# Patient Record
Sex: Male | Born: 1955 | Race: White | Hispanic: No | Marital: Married | Smoking: Former smoker
Health system: Southern US, Community
[De-identification: ages and names within clinical notes are randomized; demographics above are authoritative.]

## PROBLEM LIST (undated history)

## (undated) DIAGNOSIS — K219 Gastro-esophageal reflux disease without esophagitis: Secondary | ICD-10-CM

## (undated) DIAGNOSIS — I10 Essential (primary) hypertension: Secondary | ICD-10-CM

## (undated) DIAGNOSIS — J189 Pneumonia, unspecified organism: Secondary | ICD-10-CM

## (undated) DIAGNOSIS — F1020 Alcohol dependence, uncomplicated: Secondary | ICD-10-CM

## (undated) DIAGNOSIS — E119 Type 2 diabetes mellitus without complications: Secondary | ICD-10-CM

## (undated) HISTORY — DX: Type 2 diabetes mellitus without complications: E11.9

## (undated) HISTORY — PX: NO PAST SURGERIES: SHX2092

## (undated) HISTORY — DX: Essential (primary) hypertension: I10

---
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
## 2011-07-02 LAB — HM COLONOSCOPY

## 2013-07-11 ENCOUNTER — Ambulatory Visit: Payer: Self-pay | Admitting: Physician Assistant

## 2014-05-14 DIAGNOSIS — K635 Polyp of colon: Secondary | ICD-10-CM | POA: Insufficient documentation

## 2014-05-14 DIAGNOSIS — F5221 Male erectile disorder: Secondary | ICD-10-CM | POA: Insufficient documentation

## 2014-05-14 DIAGNOSIS — I10 Essential (primary) hypertension: Secondary | ICD-10-CM | POA: Insufficient documentation

## 2014-05-14 DIAGNOSIS — N400 Enlarged prostate without lower urinary tract symptoms: Secondary | ICD-10-CM

## 2014-05-14 DIAGNOSIS — N411 Chronic prostatitis: Secondary | ICD-10-CM | POA: Insufficient documentation

## 2014-05-14 HISTORY — DX: Chronic prostatitis: N41.1

## 2014-06-06 LAB — LIPID PANEL
Cholesterol: 211 mg/dL — AB (ref 0–200)
HDL: 46 mg/dL (ref 35–70)
LDL Cholesterol: 113 mg/dL
Triglycerides: 258 mg/dL — AB (ref 40–160)

## 2014-06-06 LAB — HEMOGLOBIN A1C: Hgb A1c MFr Bld: 9 % — AB (ref 4.0–6.0)

## 2014-06-10 ENCOUNTER — Encounter: Payer: Self-pay | Admitting: Family Medicine

## 2014-07-18 ENCOUNTER — Ambulatory Visit: Payer: Self-pay | Admitting: Urology

## 2014-08-05 ENCOUNTER — Encounter: Payer: Self-pay | Admitting: Family Medicine

## 2014-08-05 ENCOUNTER — Ambulatory Visit (INDEPENDENT_AMBULATORY_CARE_PROVIDER_SITE_OTHER): Payer: 59 | Admitting: Family Medicine

## 2014-08-05 ENCOUNTER — Other Ambulatory Visit: Payer: Self-pay | Admitting: Family Medicine

## 2014-08-05 VITALS — BP 140/80 | HR 77 | Ht 68.0 in | Wt 175.0 lb

## 2014-08-05 DIAGNOSIS — F1020 Alcohol dependence, uncomplicated: Secondary | ICD-10-CM

## 2014-08-05 DIAGNOSIS — E782 Mixed hyperlipidemia: Secondary | ICD-10-CM | POA: Diagnosis not present

## 2014-08-05 DIAGNOSIS — I10 Essential (primary) hypertension: Secondary | ICD-10-CM | POA: Diagnosis not present

## 2014-08-05 DIAGNOSIS — H6982 Other specified disorders of Eustachian tube, left ear: Secondary | ICD-10-CM

## 2014-08-05 DIAGNOSIS — E118 Type 2 diabetes mellitus with unspecified complications: Secondary | ICD-10-CM | POA: Insufficient documentation

## 2014-08-05 DIAGNOSIS — E119 Type 2 diabetes mellitus without complications: Secondary | ICD-10-CM

## 2014-08-05 DIAGNOSIS — Z720 Tobacco use: Secondary | ICD-10-CM | POA: Diagnosis not present

## 2014-08-05 DIAGNOSIS — E785 Hyperlipidemia, unspecified: Secondary | ICD-10-CM

## 2014-08-05 DIAGNOSIS — F17201 Nicotine dependence, unspecified, in remission: Secondary | ICD-10-CM | POA: Insufficient documentation

## 2014-08-05 DIAGNOSIS — F1010 Alcohol abuse, uncomplicated: Secondary | ICD-10-CM | POA: Diagnosis not present

## 2014-08-05 DIAGNOSIS — F17200 Nicotine dependence, unspecified, uncomplicated: Secondary | ICD-10-CM

## 2014-08-05 DIAGNOSIS — E1169 Type 2 diabetes mellitus with other specified complication: Secondary | ICD-10-CM | POA: Insufficient documentation

## 2014-08-05 HISTORY — DX: Alcohol dependence, uncomplicated: F10.20

## 2014-08-05 NOTE — Progress Notes (Signed)
Date:  08/05/2014   Name:  Calvin Sanders   DOB:  1955/07/05   MRN:  [id number]  PCP:  No primary care provider on file.    Chief Complaint: Follow-up   History of Present Illness:  This is a 59 y.o. male for f/u newly dx'd T2DM, has not really made any dietary changes but weight down 4#, walks a lot at work, has not seen [facility]. Seen by optho less than yr ago. Still smoking daily and drinking 2-3 beers nightly with more on the weekends. Tolerating metformin and increased BP med well. L ear bothers when flies.  Review of Systems:  Review of Systems  Respiratory: Negative for shortness of breath.   Cardiovascular: Negative for chest pain and leg swelling.  Gastrointestinal: Negative for diarrhea.    Patient Active Problem List   Diagnosis Date Noted  . Type 2 diabetes mellitus 08/05/2014  . Hyperlipemia, mixed 08/05/2014  . Smoker 08/05/2014  . Alcohol abuse 08/05/2014  . Colon polyps 05/14/2014  . Chronic prostatitis 05/14/2014  . Hypertension 05/14/2014  . Erectile disorder, generalized, mild 05/14/2014  . BPH (benign prostatic hyperplasia) 05/14/2014    Prior to Admission medications   Medication Sig Start Date End Date Taking? Authorizing Provider  Ibuprofen (ADVIL MIGRAINE PO) Take 1 tablet by mouth every 4 (four) hours as needed.   Yes Historical Provider, MD  losartan-hydrochlorothiazide (HYZAAR) 100-25 MG per tablet Take 1 tablet by mouth daily. 08/03/14  Yes Historical Provider, MD  metFORMIN (GLUCOPHAGE) 500 MG tablet Take 1 tablet by mouth 2 (two) times daily. 07/31/14  Yes Historical Provider, MD  sildenafil (VIAGRA) 100 MG tablet Take 100 mg by mouth daily as needed for erectile dysfunction.   Yes Shannon A McGowan, PA-C  tadalafil (CIALIS) 20 MG tablet Take 20 mg by mouth daily as needed for erectile dysfunction.   Yes Shannon A McGowan, PA-C    No Known Allergies  No past surgical history on file.  History  Substance Use Topics  . Smoking status:  Current Every Day Smoker  . Smokeless tobacco: Not on file  . Alcohol Use: 15.0 oz/week    25 Standard drinks or equivalent per week    No family history on file.  Medication list has been reviewed and updated.  Physical Examination: BP 140/80 mmHg  Pulse 77  Ht 5\' 8"  (1.727 m)  Wt 175 lb (79.379 kg)  BMI 26.61 kg/m2  Physical Exam  Constitutional: He appears well-developed and well-nourished.  HENT:  L TM retracted R TM clear  Cardiovascular: Normal rate, regular rhythm and normal heart sounds.   Pulmonary/Chest: Effort normal and breath sounds normal.  Musculoskeletal: He exhibits no edema.    Assessment and Plan:  1. Essential hypertension Marginal control, consider CCB if blood work ok - Medical laboratory scientific officer (BMET)  2. Type 2 diabetes mellitus without complication New onset on metformin x 1 month, minimal lifestyle changes, NCS diet, exercise, and weight loss discussed, f/u [facility] as ordered - Urine Microalbumin w/creat. ratio - Lipid Profile - HgB A1c  3. Hyperlipemia, mixed Recheck lipids, may need statin  4. Smoker Advised quitting  5. Alcohol abuse Advised reduction in alcohol use  6. Eustachian tube dysfunction, left Trial Sudafed or Afrin NS before flying  Return in about 3 months (around 11/05/2014).  Satira Anis. [facility]  08/05/2014

## 2014-08-06 LAB — BASIC METABOLIC PANEL
BUN/Creatinine Ratio: 10 (ref 10–22)
BUN: 7 mg/dL — ABNORMAL LOW (ref 8–27)
CO2: 23 mmol/L (ref 18–29)
Calcium: 9.2 mg/dL (ref 8.6–10.2)
Chloride: 94 mmol/L — ABNORMAL LOW (ref 97–108)
Creatinine, Ser: 0.73 mg/dL — ABNORMAL LOW (ref 0.76–1.27)
GFR calc non Af Amer: 101 mL/min/{1.73_m2} (ref >59)
GFR, EST AFRICAN AMERICAN: 117 mL/min/{1.73_m2} (ref >59)
Glucose: 149 mg/dL — ABNORMAL HIGH (ref 65–99)
POTASSIUM: 4.3 mmol/L (ref 3.5–5.2)
SODIUM: 134 mmol/L (ref 134–144)

## 2014-08-06 LAB — LIPID PANEL
CHOL/HDL RATIO: 4.6 ratio (ref 0.0–5.0)
CHOLESTEROL TOTAL: 213 mg/dL — AB (ref 100–199)
HDL: 46 mg/dL (ref >39)
LDL Calculated: 135 mg/dL — ABNORMAL HIGH (ref 0–99)
Triglycerides: 161 mg/dL — ABNORMAL HIGH (ref 0–149)
VLDL CHOLESTEROL CAL: 32 mg/dL (ref 5–40)

## 2014-08-06 LAB — MICROALBUMIN / CREATININE URINE RATIO
Creatinine, Urine: 86.3 mg/dL
Microalbumin, Urine: <3 ug/mL

## 2014-08-06 LAB — HEMOGLOBIN A1C
ESTIMATED AVERAGE GLUCOSE: 189 mg/dL
Hgb A1c MFr Bld: 8.2 % — ABNORMAL HIGH (ref 4.8–5.6)

## 2014-08-16 ENCOUNTER — Telehealth: Payer: Self-pay

## 2014-08-16 NOTE — Telephone Encounter (Signed)
-----   Message from [place], [place] sent at 08/16/2014 10:26 AM EDT ----- [facility]

## 2014-08-16 NOTE — Telephone Encounter (Signed)
Spoke with pt. Pt. Advised of all lab results and voiced understanding. [facility]

## 2014-09-05 ENCOUNTER — Encounter: Payer: Managed Care, Other (non HMO) | Attending: Family Medicine | Admitting: *Deleted

## 2014-09-05 ENCOUNTER — Encounter: Payer: Self-pay | Admitting: *Deleted

## 2014-09-05 VITALS — BP 160/62 | Ht 68.0 in | Wt 173.5 lb

## 2014-09-05 DIAGNOSIS — E119 Type 2 diabetes mellitus without complications: Secondary | ICD-10-CM | POA: Insufficient documentation

## 2014-09-05 NOTE — Progress Notes (Signed)
Diabetes Self-Management Education  Visit Type: First/Initial  Appt. Start Time: 1330 Appt. End Time: 0263  09/05/2014  Mr. Calvin Sanders, identified by name and date of birth, is a 59 y.o. male with a diagnosis of Diabetes: Type 2.  Other people present during visit:  Spouse   ASSESSMENT Blood pressure 160/62, height 5\' 8"  (1.727 m), weight 173 lb 8 oz (78.699 kg). Body mass index is 26.39 kg/(m^2).  Initial Visit Information: Are you currently following a meal plan?: No Are you taking your medications as prescribed?: Yes (doesn't always take evening dose of Metformin) Are you checking your feet?: No How often do you need to have someone help you when you read instructions, pamphlets, or other written materials from your doctor or pharmacy?: 1 - Never What is the last grade level you completed in school?: 12  Psychosocial: Patient Belief/Attitude about Diabetes: Other (comment) (Pt is not sure if he is ready to make changes to control blood sugars. He reports having diabetes "doesn't bother me.") Self-care barriers: None Self-management support: Family Other persons present: Spouse/SO Patient Concerns: Nutrition/Meal planning, Medication, Monitoring, Healthy Lifestyle, Problem Solving, Glycemic Control Special Needs: None Preferred Learning Style: Auditory, Hands on Learning Readiness: Contemplating  Complications:  Last HgB A1C per patient/outside source: 8.2 mg/dL (08/05/14) How often do you check your blood sugar?: 0 times/day (not testing) (Provided One Touch Verio Flex meter and instructed on use. BG upon return demonstration was 139 mg/dL at 2:30 pm - 3 hrs pp. ) Have you had a dilated eye exam in the past 12 months?: Yes Have you had a dental exam in the past 12 months?: Yes  Diet Intake: Breakfast: usually skips or has gravy biscuit from fast food restaurant Lunch: eats out - fast food Dinner: eats out - likes steak potato and bread Snack (evening): eats sweets at  night Beverage(s): occasional sweet tea; diet soda; drinks little water  Exercise: Exercise: ADL's  Individualized Plan for Diabetes Self-Management Training:  Learning Objective:  Patient will have a greater understanding of diabetes self-management.  Education Topics Reviewed with Patient Today: Definition of diabetes, type 1 and 2, and the diagnosis of diabetes, Factors that contribute to the development of diabetes Role of diet in the treatment of diabetes and the relationship between the three main macronutrients and blood glucose level Role of exercise on diabetes management, blood pressure control and cardiac health. Reviewed patients medication for diabetes, action, purpose, timing of dose and side effects. Taught/evaluated SMBG meter., Purpose and frequency of SMBG., Identified appropriate SMBG and/or A1C goals. Relationship between chronic complications and blood glucose control Review risk of smoking and offered smoking cessation  PATIENTS GOALS/Plan (Developed by the patient): Improve blood sugars Decrease medications Prevent diabetes complications Lead a healthier lifestyle Quit smoking  Plan:   Patient Instructions  Check blood sugars 2 x day before breakfast and 2 hrs after supper 3-4 x week Exercise:  Begin walking for   15 minutes   3 days a week  and gradually increase to 150 minutes/week Avoid sugar sweetened drinks (tea)  Eat 3 meals day, 1-2  snacks a day Space meals 4-6 hours apart Don't skip meals Limit desserts/sweets Call your doctor for a prescription for:  1. Meter strips (type)  One Touch Verio checking  3-4 times per week  2. Lancets (type)  One Touch Delica checking  3-4   times per week Quit smoking  Call if you want to return for classes or 1:1 appt with nurse or dietitian  Expected Outcomes:  Demonstrated limited interest in learning.  Expect minimal changes  Education material provided:  General Meal Planning Guidelines Meter - One Touch  Verio Flex Fast Food Guide (BD)  If problems or questions, patient to contact team via:  Johny Drilling, [place], [place], CDE [phone]  Future DSME appointment:  (Pt doesn't want to return due to his work schedule. )

## 2014-09-05 NOTE — Patient Instructions (Addendum)
Check blood sugars 2 x day before breakfast and 2 hrs after supper 3-4 x week Exercise:  Begin walking for   15 minutes   3 days a week  and gradually increase to 150 minutes/week Avoid sugar sweetened drinks (tea)  Eat 3 meals day, 1-2  snacks a day Space meals 4-6 hours apart Don't skip meals Limit desserts/sweets Call your doctor for a prescription for:  1. Meter strips (type)  One Touch Verio checking  3-4 times per week  2. Lancets (type)  One Touch Delica checking  3-4   times per week Quit smoking  Call if you want to return for classes or 1:1 appt with nurse or dietitian

## 2014-09-06 ENCOUNTER — Ambulatory Visit (INDEPENDENT_AMBULATORY_CARE_PROVIDER_SITE_OTHER): Payer: 59 | Admitting: Urology

## 2014-09-06 ENCOUNTER — Encounter: Payer: Self-pay | Admitting: Urology

## 2014-09-06 VITALS — BP 151/74 | HR 74 | Ht 68.0 in | Wt 171.5 lb

## 2014-09-06 DIAGNOSIS — N401 Enlarged prostate with lower urinary tract symptoms: Secondary | ICD-10-CM

## 2014-09-06 DIAGNOSIS — Z8042 Family history of malignant neoplasm of prostate: Secondary | ICD-10-CM

## 2014-09-06 DIAGNOSIS — N138 Other obstructive and reflux uropathy: Secondary | ICD-10-CM

## 2014-09-06 DIAGNOSIS — N528 Other male erectile dysfunction: Secondary | ICD-10-CM | POA: Diagnosis not present

## 2014-09-06 DIAGNOSIS — N529 Male erectile dysfunction, unspecified: Secondary | ICD-10-CM | POA: Insufficient documentation

## 2014-09-06 LAB — BLADDER SCAN AMB NON-IMAGING: SCAN RESULT: 25

## 2014-09-06 MED ORDER — TADALAFIL 20 MG PO TABS: 20.0000 mg | ORAL_TABLET | Freq: Every day | ORAL | Status: DC | PRN

## 2014-09-06 NOTE — Progress Notes (Signed)
09/06/2014 10:37 AM   Calvin Sanders [birth year] [id number]  Referring provider: No referring provider defined for this encounter.  Chief Complaint  Patient presents with  . Benign Prostatic Hypertrophy    HPI: Calvin Sanders is a 59 year old white male with a history of ED and BPH with LUTS who presents today for his yearly follow up.    His IPSS score today is 12, which is moderate lower urinary tract symptomatology. He is pleased with his quality life due to his urinary symptoms. His PVR is 25 mL.   His major complaint today frequency and urgency.  He has had these symptoms for two years.  He denies any dysuria, hematuria or suprapubic pain.   He currently managing his BPH with LUTS with observation and the avoidence of bladder irritants.  His has not had any prostate surgeries or biopsies.  Previous PSA's:    1.5 ng/mL on 07/16/2013     He also denies any recent fevers, chills, nausea or vomiting.  He has a family history of PCa, with his brother being diagnosed with PCa.         IPSS      09/06/14 1000       International Prostate Symptom Score   How often have you had the sensation of not emptying your bladder? Less than half the time     How often have you had to urinate less than every two hours? More than half the time     How often have you found you stopped and started again several times when you urinated? Less than half the time     How often have you found it difficult to postpone urination? About half the time     How often have you had a weak urinary stream? Not at All     How often have you had to strain to start urination? Not at All     How many times did you typically get up at night to urinate? 1 Time     Total IPSS Score 12     Quality of Life due to urinary symptoms   If you were to spend the rest of your life with your urinary condition just the way it is now how would you feel about that? Pleased        Score:  1-7 Mild 8-19  Moderate 20-35 Severe  His SHIM score is 13, which is mild to moderate ED.   He has been having difficulty with erections for several years.   His major complaint is maintaining.  His libido is good.   His risk factors for ED are smoking, DM, BPH, alcohol abuse and HTN.  He denies any painful erections or curvatures with his erections.   He has tried Cialis and Viagra in the past.  Cialis is the most effective.        SHIM      09/06/14 1018       SHIM: Over the last 6 months:   How do you rate your confidence that you could get and keep an erection? Low     When you had erections with sexual stimulation, how often were your erections hard enough for penetration (entering your partner)? Most Times (much more than half the time)     During sexual intercourse, how often were you able to maintain your erection after you had penetrated (entered) your partner? Not Difficult     During sexual intercourse, how difficult was  it to maintain your erection to completion of intercourse? Extremely Difficult     When you attempted sexual intercourse, how often was it satisfactory for you? Extremely Difficult     SHIM Total Score   SHIM 13        Score: 1-7 Severe ED 8-11 Moderate ED 12-16 Mild-Moderate ED 17-21 Mild ED 22-25 No ED        PMH: Past Medical History  Diagnosis Date  . Hypertension   . Diabetes mellitus without complication     Surgical History: No past surgical history on file.  Home Medications:    Medication List       This list is accurate as of: 09/06/14 10:37 AM.  Always use your most recent med list.               ADVIL MIGRAINE PO  Take 1 tablet by mouth every 4 (four) hours as needed.     losartan-hydrochlorothiazide 100-25 MG per tablet  Commonly known as:  HYZAAR  Take 1 tablet by mouth daily.     metFORMIN 500 MG tablet  Commonly known as:  GLUCOPHAGE  Take 1 tablet by mouth 2 (two) times daily.     sildenafil 100 MG tablet  Commonly  known as:  VIAGRA  Take 100 mg by mouth daily as needed for erectile dysfunction.     tadalafil 20 MG tablet  Commonly known as:  CIALIS  Take 20 mg by mouth daily as needed for erectile dysfunction.        Allergies: No Known Allergies  Family History: Family History  Problem Relation Age of Onset  . Diabetes Mother   . Cancer Father     bone  . Kidney cancer Neg Hx   . Kidney disease Neg Hx   . Heart disease Brother   . Prostate cancer Brother     Social History:  reports that he has been smoking Cigarettes.  He started smoking about 10 years ago. He has a 10 pack-year smoking history. He has never used smokeless tobacco. He reports that he drinks about 15.0 oz of alcohol per week. His drug history is not on file.  ROS: UROLOGY Frequent Urination?: Yes Hard to postpone urination?: Yes Burning/pain with urination?: No Get up at night to urinate?: Yes Leakage of urine?: Yes Urine stream starts and stops?: Yes Trouble starting stream?: No Do you have to strain to urinate?: No Blood in urine?: No Urinary tract infection?: No Sexually transmitted disease?: No Injury to kidneys or bladder?: No Painful intercourse?: No Weak stream?: No Erection problems?: Yes Penile pain?: No  Gastrointestinal Nausea?: No Vomiting?: No Indigestion/heartburn?: Yes Diarrhea?: Yes Constipation?: No  Constitutional Fever: No Night sweats?: Yes Weight loss?: No Fatigue?: No  Skin Skin rash/lesions?: No Itching?: No  Eyes Blurred vision?: Yes Double vision?: No  Ears/Nose/Throat Sore throat?: No Sinus problems?: No  Hematologic/Lymphatic Swollen glands?: No Easy bruising?: No  Cardiovascular Leg swelling?: No Chest pain?: No  Respiratory Cough?: No Shortness of breath?: No  Endocrine Excessive thirst?: Yes  Musculoskeletal Back pain?: Yes Joint pain?: Yes  Neurological Headaches?: Yes Dizziness?: No  Psychologic Depression?: No Anxiety?:  No  Physical Exam: BP 151/74 mmHg  Pulse 74  Ht 5\' 8"  (1.727 m)  Wt 171 lb 8 oz (77.792 kg)  BMI 26.08 kg/m2  GU: Patient with circumcised phallus.  Urethral meatus is patent.  No penile discharge. No penile lesions or rashes. Scrotum without lesions, cysts, rashes and/or edema.  Testicles  are located scrotally bilaterally. No masses are appreciated in the testicles. Left and right epididymis are normal. Rectal: Patient with  normal sphincter tone. Perineum without scarring or rashes. No rectal masses are appreciated. Prostate is approximately 50 grams, no nodules are appreciated. Seminal vesicles are normal.   Laboratory Data: Results for orders placed or performed in visit on 09/06/14  BLADDER SCAN AMB NON-IMAGING  Result Value Ref Range   Scan Result 25    No results found for: WBC, HGB, HCT, MCV, PLT  Lab Results  Component Value Date   CREATININE 0.73* 08/05/2014    No results found for: PSA  No results found for: TESTOSTERONE  Lab Results  Component Value Date   HGBA1C 8.2* 08/05/2014    Urinalysis No results found for: COLORURINE, APPEARANCEUR, LABSPEC, PHURINE, GLUCOSEU, HGBUR, BILIRUBINUR, KETONESUR, PROTEINUR, UROBILINOGEN, NITRITE, LEUKOCYTESUR  Pertinent Imaging:  Assessment & Plan:    1. BPH with LUTS:   Patient's IPSS score is 12/1.  His PVR 25.  His DRE demonstrates enlargement.  I discussed the treatment options for BPH, such as: observation over time with prn avoidance of alcohol/caffeine or medical treatment.  Patient like to continue observation.    He will follow up in 12 months for a PSA, DRE, PVR and an IPSS.    - BLADDER SCAN AMB NON-IMAGING - PSA  2. Erectile dysfunction:   Patient's SHIM is 13.  He would like to continue the Cialis.  A tree scription for Cialis 20 mg on-demand dosing is sent to his pharmacy. He will follow-up in one year's time with SH IM score and symptom recheck.  3. Family history of PCa:   Patient's brother with a h/o PCa.      No Follow-up on file.  Zara Council, [facility] [address], [place], [place] [address] [phone]

## 2014-09-07 ENCOUNTER — Telehealth: Payer: Self-pay

## 2014-09-07 DIAGNOSIS — R972 Elevated prostate specific antigen [PSA]: Secondary | ICD-10-CM

## 2014-09-07 LAB — PSA: Prostate Specific Ag, Serum: 1.7 ng/mL (ref 0.0–4.0)

## 2014-09-07 NOTE — Telephone Encounter (Signed)
Spoke with pt and made aware of lab results. Pt voiced understanding. Orders were placed.

## 2014-09-07 NOTE — Telephone Encounter (Signed)
-----   Message from Nori Riis, PA-C sent at 09/07/2014  9:03 AM EDT ----- Patient's PSA is stable.  We will see him in 12 months.  PSA to be drawn before his next appointment.

## 2014-09-22 ENCOUNTER — Telehealth: Payer: Self-pay

## 2014-09-22 MED ORDER — ONETOUCH LANCETS MISC: 1.0000 [IU] | Status: DC

## 2014-09-22 MED ORDER — GLUCOSE BLOOD VI STRP: ORAL_STRIP | Status: DC

## 2014-09-22 NOTE — Telephone Encounter (Signed)
Patient needs lancets and strips for his One Touch Verio Flex to [employer] [facility]

## 2014-09-22 NOTE — Telephone Encounter (Signed)
Done

## 2014-11-10 ENCOUNTER — Telehealth: Payer: Self-pay

## 2014-11-10 MED ORDER — GLUCOSE BLOOD VI STRP: ORAL_STRIP | Status: DC

## 2014-11-10 NOTE — Telephone Encounter (Signed)
Done

## 2014-11-10 NOTE — Addendum Note (Signed)
Addended by: Adline Potter on: 11/10/2014 12:44 PM   Modules accepted: Orders

## 2014-11-10 NOTE — Telephone Encounter (Signed)
Refill test strips per pt request.

## 2015-01-31 ENCOUNTER — Other Ambulatory Visit: Payer: Self-pay | Admitting: Family Medicine

## 2015-01-31 NOTE — Telephone Encounter (Signed)
Patient has not been seen here by Dr. Wynetta Emery in over a year It looks like he is now seeing Dr. Vicente Masson I will deny this Rx request (covering for Dr. Wynetta Emery)

## 2015-01-31 NOTE — Telephone Encounter (Signed)
Needs RTO for f/u DM, HTN, should have been seen in September

## 2015-02-07 ENCOUNTER — Ambulatory Visit (INDEPENDENT_AMBULATORY_CARE_PROVIDER_SITE_OTHER): Payer: Managed Care, Other (non HMO) | Admitting: Family Medicine

## 2015-02-07 ENCOUNTER — Encounter: Payer: Self-pay | Admitting: Family Medicine

## 2015-02-07 VITALS — BP 122/62 | HR 72 | Ht 68.0 in | Wt 175.4 lb

## 2015-02-07 DIAGNOSIS — M79672 Pain in left foot: Secondary | ICD-10-CM | POA: Diagnosis not present

## 2015-02-07 DIAGNOSIS — E663 Overweight: Secondary | ICD-10-CM

## 2015-02-07 DIAGNOSIS — Z720 Tobacco use: Secondary | ICD-10-CM | POA: Diagnosis not present

## 2015-02-07 DIAGNOSIS — F1010 Alcohol abuse, uncomplicated: Secondary | ICD-10-CM

## 2015-02-07 DIAGNOSIS — M79671 Pain in right foot: Secondary | ICD-10-CM

## 2015-02-07 DIAGNOSIS — H9313 Tinnitus, bilateral: Secondary | ICD-10-CM | POA: Diagnosis not present

## 2015-02-07 DIAGNOSIS — E782 Mixed hyperlipidemia: Secondary | ICD-10-CM

## 2015-02-07 DIAGNOSIS — E559 Vitamin D deficiency, unspecified: Secondary | ICD-10-CM

## 2015-02-07 DIAGNOSIS — I10 Essential (primary) hypertension: Secondary | ICD-10-CM

## 2015-02-07 DIAGNOSIS — F17200 Nicotine dependence, unspecified, uncomplicated: Secondary | ICD-10-CM

## 2015-02-07 DIAGNOSIS — E119 Type 2 diabetes mellitus without complications: Secondary | ICD-10-CM | POA: Diagnosis not present

## 2015-02-07 NOTE — Progress Notes (Signed)
Date:  02/07/2015   Name:  Calvin Sanders   DOB:  25-Jun-1955   MRN:  [id number]  PCP:  Adline Potter, MD    Chief Complaint: Hypertension and Diabetes   History of Present Illness:  This is a 59 y.o. male for f/u T2DM, HLD, HTN, smoking, excess alcohol use. BG's well controlled at home, higher in am as enjoys sweet snack at bedtime, last a1c 8.2% and MCR normal in June. Still drinking 4-5 beers nightly (LFT's normal 4/16) and smoking but trying to decrease both. Weight stable. Sees [facility] for optho but not in past year. Saw urology in July for BPH/ED/FH prostate ca, remains on Cialis with good effect. C/o intermittent tinnitus B ears, no progressing, just bothersome. C/o B foot pain, balls of feet, worse in am, improves during the day, some burning but no numbness/tingling.   Review of Systems:  Review of Systems  Constitutional: Negative for fever and fatigue.  Respiratory: Negative for shortness of breath.   Cardiovascular: Negative for chest pain and leg swelling.  Endocrine: Negative for polyuria.  Neurological: Negative for syncope and light-headedness.    Patient Active Problem List   Diagnosis Date Noted  . Overweight (BMI 25.0-29.9) 02/07/2015  . 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 Family history of prostate cancer 09/06/2014  . Diabetes mellitus type 2, controlled, without complications ([place]) [id number]  . Hyperlipemia, mixed 08/05/2014  . Smoker 08/05/2014  . Excessive drinking of alcohol 08/05/2014  . Colon polyps 05/14/2014  . Chronic prostatitis 05/14/2014  . Hypertension 05/14/2014  . BPH (benign prostatic hyperplasia) 05/14/2014    Prior to Admission medications   Medication Sig Start Date End Date Taking? Authorizing Provider  glucose blood test strip One Touch Zero Flex glucose test strips, use as directed 11/10/14   Adline Potter, MD  Ibuprofen (ADVIL MIGRAINE PO) Take 1 tablet by mouth every 4 (four) hours as needed.    Historical  Provider, MD  losartan-hydrochlorothiazide Konrad Penta) 50-12.5 MG tablet  01/31/15   Historical Provider, MD  metFORMIN (GLUCOPHAGE) 500 MG tablet Take 1 tablet by mouth 2 (two) times daily. 07/31/14   Historical Provider, MD  ONE TOUCH LANCETS MISC 1 Units by Does not apply route as directed. 09/22/14   Adline Potter, MD  tadalafil (CIALIS) 20 MG tablet Take 20 mg by mouth daily as needed for erectile dysfunction.    Shannon A McGowan, PA-C    No Known Allergies  No past surgical history on file.  Social History  Substance Use Topics  . Smoking status: Current Every Day Smoker -- 1.00 packs/day for 10 years    Types: Cigarettes    Start date: 02/12/2004  . Smokeless tobacco: Never Used  . Alcohol Use: 15.0 oz/week    25 Cans of beer per week     Comment: 4-6 beers every evening    Family History  Problem Relation Age of Onset  . Diabetes Mother   . Cancer Father     bone  . Kidney cancer Neg Hx   . Kidney disease Neg Hx   . Heart disease Brother   . Prostate cancer Brother     Medication list has been reviewed and updated.  Physical Examination: BP 122/62 mmHg  Pulse 72  Ht 5\' 8"  (1.727 m)  Wt 175 lb 6.4 oz (79.561 kg)  BMI 26.68 kg/m2  Physical Exam  Constitutional: He appears well-developed and well-nourished.  HENT:  TM's clear  Cardiovascular: Normal rate, regular rhythm, normal heart sounds and  intact distal pulses.   Pulmonary/Chest: Effort normal and breath sounds normal.  Musculoskeletal: He exhibits no edema.  B feet no tenderness, monofilament testing negative  Neurological: He is alert.  Skin: Skin is warm and dry.  Psychiatric: He has a normal mood and affect. His behavior is normal.  Nursing note and vitals reviewed.   Assessment and Plan:  1. Controlled type 2 diabetes mellitus without complication, without long-term current use of insulin (HCC) Improved control on metformin last visit, a1c today, pt to schedule optho appt - HgB A1c  2.  Essential hypertension Well controlled on lower dose losartan/HCTZ  3. Hyperlipemia, mixed Consider high intensity statin/asa given calculated 21yr CV risk 30%  4. Smoker Strongly advised cessation  5. Excessive drinking of alcohol Advised decrease to 2 drinks/day  6. Foot pain, bilateral Unclear etiology, progressive, MF testing negative - Ambulatory referral to Podiatry  7. Tinnitus of both ears Reassured likely benign, consider ENT referral if sxs worsen  8. Overweight (BMI 25.0-29.9) Exercise/weight loss discussed, saw diabetes educator in July - Vitamin D (25 hydroxy)  Return in about 3 months (around 05/08/2015).  Satira Anis. [facility]  02/07/2015

## 2015-02-08 DIAGNOSIS — E559 Vitamin D deficiency, unspecified: Secondary | ICD-10-CM | POA: Insufficient documentation

## 2015-02-08 LAB — HEMOGLOBIN A1C
Est. average glucose Bld gHb Est-mCnc: 169 mg/dL
Hgb A1c MFr Bld: 7.5 % — ABNORMAL HIGH (ref 4.8–5.6)

## 2015-02-08 LAB — VITAMIN D 25 HYDROXY (VIT D DEFICIENCY, FRACTURES): Vit D, 25-Hydroxy: 16 ng/mL — ABNORMAL LOW (ref 30.0–100.0)

## 2015-02-08 MED ORDER — ASPIRIN 81 MG PO TABS: 81.0000 mg | ORAL_TABLET | Freq: Every day | ORAL | Status: DC

## 2015-02-08 MED ORDER — ATORVASTATIN CALCIUM 40 MG PO TABS: 40.0000 mg | ORAL_TABLET | Freq: Every day | ORAL | Status: DC

## 2015-02-08 MED ORDER — VITAMIN D 50 MCG (2000 UT) PO CAPS: 1.0000 | ORAL_CAPSULE | Freq: Every day | ORAL | Status: DC

## 2015-02-08 NOTE — Addendum Note (Signed)
Addended by: Adline Potter on: 02/08/2015 09:25 AM   Modules accepted: Orders, SmartSet

## 2015-02-09 ENCOUNTER — Other Ambulatory Visit: Payer: Self-pay

## 2015-02-09 ENCOUNTER — Other Ambulatory Visit: Payer: Self-pay | Admitting: Family Medicine

## 2015-02-09 MED ORDER — METFORMIN HCL 500 MG PO TABS: 500.0000 mg | ORAL_TABLET | Freq: Two times a day (BID) | ORAL | Status: DC

## 2015-05-08 ENCOUNTER — Encounter: Payer: Self-pay | Admitting: Family Medicine

## 2015-05-08 ENCOUNTER — Ambulatory Visit (INDEPENDENT_AMBULATORY_CARE_PROVIDER_SITE_OTHER): Payer: Managed Care, Other (non HMO) | Admitting: Family Medicine

## 2015-05-08 VITALS — BP 122/64 | HR 64 | Ht 68.0 in | Wt 171.6 lb

## 2015-05-08 DIAGNOSIS — F1010 Alcohol abuse, uncomplicated: Secondary | ICD-10-CM

## 2015-05-08 DIAGNOSIS — R200 Anesthesia of skin: Secondary | ICD-10-CM

## 2015-05-08 DIAGNOSIS — Z720 Tobacco use: Secondary | ICD-10-CM

## 2015-05-08 DIAGNOSIS — R208 Other disturbances of skin sensation: Secondary | ICD-10-CM | POA: Diagnosis not present

## 2015-05-08 DIAGNOSIS — I10 Essential (primary) hypertension: Secondary | ICD-10-CM | POA: Diagnosis not present

## 2015-05-08 DIAGNOSIS — E663 Overweight: Secondary | ICD-10-CM | POA: Diagnosis not present

## 2015-05-08 DIAGNOSIS — E559 Vitamin D deficiency, unspecified: Secondary | ICD-10-CM

## 2015-05-08 DIAGNOSIS — N400 Enlarged prostate without lower urinary tract symptoms: Secondary | ICD-10-CM | POA: Diagnosis not present

## 2015-05-08 DIAGNOSIS — E782 Mixed hyperlipidemia: Secondary | ICD-10-CM | POA: Diagnosis not present

## 2015-05-08 DIAGNOSIS — F17200 Nicotine dependence, unspecified, uncomplicated: Secondary | ICD-10-CM

## 2015-05-08 DIAGNOSIS — E119 Type 2 diabetes mellitus without complications: Secondary | ICD-10-CM | POA: Diagnosis not present

## 2015-05-08 NOTE — Progress Notes (Signed)
Date:  05/08/2015   Name:  Calvin Sanders   DOB:  Jan 13, 1956   MRN:  [id number]  PCP:  Adline Potter, MD    Chief Complaint: Follow-up and Diabetes   History of Present Illness:  This is a 60 y.o. male for 3 month f/u, DM about the same, only check home BG's prn. C/o some numbness in fingers R > L, more constant recently, never saw podiatry for foot pain which has improved somewhat. Has also not seen optho as discussed last visit. Started on Lipitor and asa after last visit given elevated CVR. Still smoking 1 ppd and drinking 4-5 beers nightly. Taking vit D supp. BPH sxs well controlled on Cialis.  Review of Systems:  Review of Systems  Constitutional: Negative for fever and fatigue.  Respiratory: Negative for cough and shortness of breath.   Cardiovascular: Negative for chest pain and leg swelling.  Endocrine: Negative for polyuria.  Genitourinary: Negative for difficulty urinating.  Neurological: Negative for syncope and light-headedness.    Patient Active Problem List   Diagnosis Date Noted  . Vitamin D deficiency 02/08/2015  . Overweight (BMI 25.0-29.9) 02/07/2015  . 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 Family history of prostate cancer 09/06/2014  . Diabetes mellitus type 2, controlled, without complications ([place]) [id number]  . Hyperlipemia, mixed 08/05/2014  . Smoker 08/05/2014  . Excessive drinking of alcohol 08/05/2014  . Colon polyps 05/14/2014  . Chronic prostatitis 05/14/2014  . Hypertension 05/14/2014  . BPH (benign prostatic hyperplasia) 05/14/2014    Prior to Admission medications   Medication Sig Start Date End Date Taking? Authorizing Provider  aspirin 81 MG tablet Take 1 tablet (81 mg total) by mouth daily. 02/08/15  Yes Adline Potter, MD  atorvastatin (LIPITOR) 40 MG tablet Take 1 tablet (40 mg total) by mouth daily. 02/08/15  Yes Adline Potter, MD  Cholecalciferol (VITAMIN D) 2000 units CAPS Take 1 capsule (2,000 Units total) by mouth  daily. 02/08/15  Yes Adline Potter, MD  glucose blood test strip One Touch Zero Flex glucose test strips, use as directed 11/10/14  Yes Adline Potter, MD  Ibuprofen (ADVIL MIGRAINE PO) Take 1 tablet by mouth every 4 (four) hours as needed.   Yes Historical Provider, MD  losartan-hydrochlorothiazide Konrad Penta) 50-12.5 MG tablet  01/31/15  Yes Historical Provider, MD  metFORMIN (GLUCOPHAGE) 500 MG tablet Take 1 tablet (500 mg total) by mouth 2 (two) times daily. 02/09/15  Yes Adline Potter, MD  ONE TOUCH LANCETS MISC 1 Units by Does not apply route as directed. 09/22/14  Yes Adline Potter, MD  tadalafil (CIALIS) 20 MG tablet Take 20 mg by mouth daily as needed for erectile dysfunction.   Yes Nori Riis, PA-C    No Known Allergies  Past Surgical History  Procedure Laterality Date  . No past surgeries      Social History  Substance Use Topics  . Smoking status: Current Every Day Smoker -- 1.00 packs/day for 10 years    Types: Cigarettes    Start date: 02/12/2004  . Smokeless tobacco: Never Used  . Alcohol Use: 15.0 oz/week    25 Cans of beer per week     Comment: 4-6 beers every evening    Family History  Problem Relation Age of Onset  . Diabetes Mother   . Cancer Father     bone  . Kidney cancer Neg Hx   . Kidney disease Neg Hx   . Heart disease Brother   . Prostate cancer Brother  Medication list has been reviewed and updated.  Physical Examination: BP 122/64 mmHg  Pulse 64  Ht 5\' 8"  (1.727 m)  Wt 171 lb 9.6 oz (77.837 kg)  BMI 26.10 kg/m2  Physical Exam  Constitutional: He appears well-developed and well-nourished.  Cardiovascular: Normal rate, regular rhythm and normal heart sounds.   Pulmonary/Chest: Effort normal and breath sounds normal.  Musculoskeletal: He exhibits no edema.  Neurological: He is alert.  Skin: Skin is warm and dry.  Psychiatric: He has a normal mood and affect. His behavior is normal.  Nursing note and vitals reviewed.   Assessment  and Plan:  1. Controlled type 2 diabetes mellitus without complication, without long-term current use of insulin (HCC) Improved controlled (a1c 7.5%) last visit, cont metformin, see optho Burt Ek) before next visit - HgB A1c  2. Essential hypertension Well controlled on Hyzaar - Comprehensive Metabolic Panel (CMET) - CBC  3. 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 Hyperlipemia, mixed On Lipitor/asa - Lipid Profile  5. BPH (benign prostatic hyperplasia) Followed by urology, cont Cialis  6. Smoker Advised cessation or at least cutting back use  7. Excessive drinking of alcohol Advised limiting use to 2 beers/night  8. Overweight (BMI 25.0-29.9) Weight down 4#, cont weight loss  9. Vitamin D deficiency On supplement - Vitamin D (25 hydroxy)   Return in about 3 months (around 08/08/2015).  Satira Anis. [facility]  05/08/2015

## 2015-05-09 ENCOUNTER — Other Ambulatory Visit: Payer: Self-pay

## 2015-05-09 LAB — CBC
HEMATOCRIT: 43.2 % (ref 37.5–51.0)
HEMOGLOBIN: 15.1 g/dL (ref 12.6–17.7)
MCH: 34.6 pg — AB (ref 26.6–33.0)
MCHC: 35 g/dL (ref 31.5–35.7)
MCV: 99 fL — AB (ref 79–97)
Platelets: 203 10*3/uL (ref 150–379)
RBC: 4.36 x10E6/uL (ref 4.14–5.80)
RDW: 13.5 % (ref 12.3–15.4)
WBC: 6.8 10*3/uL (ref 3.4–10.8)

## 2015-05-09 LAB — COMPREHENSIVE METABOLIC PANEL
A/G RATIO: 2.2 (ref 1.2–2.2)
ALBUMIN: 4.6 g/dL (ref 3.6–4.8)
ALT: 30 IU/L (ref 0–44)
AST: 22 IU/L (ref 0–40)
Alkaline Phosphatase: 69 IU/L (ref 39–117)
BUN/Creatinine Ratio: 10 (ref 10–22)
BUN: 8 mg/dL (ref 8–27)
Bilirubin Total: 0.6 mg/dL (ref 0.0–1.2)
CALCIUM: 9.3 mg/dL (ref 8.6–10.2)
CO2: 22 mmol/L (ref 18–29)
CREATININE: 0.78 mg/dL (ref 0.76–1.27)
Chloride: 94 mmol/L — ABNORMAL LOW (ref 96–106)
GFR, EST AFRICAN AMERICAN: 113 mL/min/{1.73_m2} (ref >59)
GFR, EST NON AFRICAN AMERICAN: 97 mL/min/{1.73_m2} (ref >59)
GLOBULIN, TOTAL: 2.1 g/dL (ref 1.5–4.5)
Glucose: 125 mg/dL — ABNORMAL HIGH (ref 65–99)
POTASSIUM: 4.7 mmol/L (ref 3.5–5.2)
SODIUM: 137 mmol/L (ref 134–144)
TOTAL PROTEIN: 6.7 g/dL (ref 6.0–8.5)

## 2015-05-09 LAB — LIPID PANEL
CHOLESTEROL TOTAL: 160 mg/dL (ref 100–199)
Chol/HDL Ratio: 2.5 ratio units (ref 0.0–5.0)
HDL: 65 mg/dL (ref >39)
LDL CALC: 80 mg/dL (ref 0–99)
TRIGLYCERIDES: 73 mg/dL (ref 0–149)
VLDL Cholesterol Cal: 15 mg/dL (ref 5–40)

## 2015-05-09 LAB — VITAMIN D 25 HYDROXY (VIT D DEFICIENCY, FRACTURES): VIT D 25 HYDROXY: 36.4 ng/mL (ref 30.0–100.0)

## 2015-05-09 LAB — HEMOGLOBIN A1C
ESTIMATED AVERAGE GLUCOSE: 163 mg/dL
HEMOGLOBIN A1C: 7.3 % — AB (ref 4.8–5.6)

## 2015-06-08 ENCOUNTER — Telehealth: Payer: Self-pay

## 2015-06-08 MED ORDER — LOSARTAN POTASSIUM-HCTZ 50-12.5 MG PO TABS: 1.0000 | ORAL_TABLET | Freq: Every day | ORAL | Status: DC

## 2015-06-08 NOTE — Telephone Encounter (Signed)
Sent to [place] 

## 2015-06-08 NOTE — Addendum Note (Signed)
Addended by: Adline Potter on: 06/08/2015 11:53 AM   Modules accepted: Orders

## 2015-06-08 NOTE — Telephone Encounter (Signed)
Done

## 2015-08-07 ENCOUNTER — Ambulatory Visit: Payer: Managed Care, Other (non HMO) | Admitting: Family Medicine

## 2015-08-25 ENCOUNTER — Other Ambulatory Visit: Payer: 59

## 2015-09-04 ENCOUNTER — Ambulatory Visit: Payer: 59 | Admitting: Urology

## 2015-09-20 ENCOUNTER — Other Ambulatory Visit: Payer: Self-pay

## 2015-09-20 ENCOUNTER — Ambulatory Visit (INDEPENDENT_AMBULATORY_CARE_PROVIDER_SITE_OTHER): Payer: Managed Care, Other (non HMO) | Admitting: Internal Medicine

## 2015-09-20 ENCOUNTER — Encounter: Payer: Self-pay | Admitting: Internal Medicine

## 2015-09-20 VITALS — BP 124/72 | HR 69 | Temp 98.6°F | Resp 16 | Ht 68.0 in | Wt 160.4 lb

## 2015-09-20 DIAGNOSIS — I10 Essential (primary) hypertension: Secondary | ICD-10-CM | POA: Diagnosis not present

## 2015-09-20 DIAGNOSIS — R634 Abnormal weight loss: Secondary | ICD-10-CM | POA: Diagnosis not present

## 2015-09-20 DIAGNOSIS — N400 Enlarged prostate without lower urinary tract symptoms: Secondary | ICD-10-CM

## 2015-09-20 DIAGNOSIS — K2970 Gastritis, unspecified, without bleeding: Secondary | ICD-10-CM

## 2015-09-20 DIAGNOSIS — E119 Type 2 diabetes mellitus without complications: Secondary | ICD-10-CM

## 2015-09-20 MED ORDER — DEXLANSOPRAZOLE 60 MG PO CPDR
60.0000 mg | DELAYED_RELEASE_CAPSULE | Freq: Every day | ORAL | 0 refills | Status: DC
Start: 2015-09-20 — End: 2015-09-20

## 2015-09-20 MED ORDER — OMEPRAZOLE 40 MG PO CPDR: 40.0000 mg | DELAYED_RELEASE_CAPSULE | Freq: Every day | ORAL | 1 refills | Status: DC

## 2015-09-20 MED ORDER — TADALAFIL 20 MG PO TABS: 20.0000 mg | ORAL_TABLET | Freq: Every day | ORAL | 1 refills | Status: DC | PRN

## 2015-09-20 MED ORDER — DEXLANSOPRAZOLE 60 MG PO CPDR: 60.0000 mg | DELAYED_RELEASE_CAPSULE | Freq: Every day | ORAL | 0 refills | Status: DC

## 2015-09-20 NOTE — Patient Instructions (Signed)

## 2015-09-20 NOTE — Progress Notes (Signed)
Date:  09/20/2015   Name:  Calvin Sanders   DOB:  1955-08-24   MRN:  [id number]   Chief Complaint: Nausea (Comes and goes but worse in stressful situations. Patient having lot of stress at work. ) Patient reports being under increased amounts of stress at work. 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 He admits to eating poorly - today he's had peanut M&M's, peanut butter crackers and some peanuts. Review of weights show a 20 pound weight loss in the past 18 months. He denies chest pains shortness of breath wheezing change in bowel habits vomiting or headaches. He continues to smoke cigarettes daily and drink 4-6 beers per day.   Abdominal Cramping  This is a new problem. The problem occurs intermittently. The problem has been waxing and waning. The pain is located in the epigastric region. The quality of the pain is colicky. The abdominal pain radiates to the epigastric region. Associated symptoms include anorexia, nausea and weight loss. Pertinent negatives include no arthralgias, constipation or headaches. The pain is aggravated by drinking alcohol.  Diabetes  He presents for his follow-up diabetic visit. He has type 2 diabetes mellitus. His disease course has been stable. Pertinent negatives for hypoglycemia include no dizziness or headaches. Associated symptoms include weight loss. Pertinent negatives for diabetes include no chest pain, no fatigue, no foot ulcerations, no visual change and no weakness. Symptoms are stable. Current diabetic treatment includes oral agent (monotherapy). He is compliant with treatment most of the time. He is following a generally unhealthy diet. When asked about meal planning, he reported none.  Hypertension  This is a chronic problem. The current episode started more than 1 year ago. The problem is unchanged. The problem is controlled. Pertinent negatives include no chest pain, headaches, palpitations  or shortness of breath. Risk factors for coronary artery disease include diabetes mellitus, male gender and smoking/tobacco exposure. Past treatments include angiotensin blockers and diuretics. The current treatment provides significant improvement.  ED - he has used Viagra in the past but was changed to Cialis for unknown reason. On review Viagra is not on his formulary, therefore he would like to have another Cialis prescription.  Lab Results  Component Value Date   HGBA1C 7.3 (H) 05/08/2015     Review of Systems  Constitutional: Positive for appetite change, unexpected weight change and weight loss. Negative for chills and fatigue.  Eyes: Negative for visual disturbance.  Respiratory: Negative for cough, chest tightness, shortness of breath and stridor.   Cardiovascular: Negative for chest pain, palpitations and leg swelling.  Gastrointestinal: Positive for abdominal pain, anorexia and nausea. Negative for blood in stool and constipation.  Genitourinary: Negative for difficulty urinating.  Musculoskeletal: Negative for arthralgias.  Neurological: Negative for dizziness, weakness and headaches.  Hematological: Negative for adenopathy.  Psychiatric/Behavioral: Positive for dysphoric mood. Negative for sleep disturbance.    Patient Active Problem List   Diagnosis Date Noted  . Vitamin D deficiency 02/08/2015  . Overweight (BMI 25.0-29.9) 02/07/2015  . 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 Family history of prostate cancer 09/06/2014  . Diabetes mellitus type 2, controlled, without complications ([place]) [id number]  . Hyperlipemia, mixed 08/05/2014  . Smoker 08/05/2014  . Excessive drinking of alcohol 08/05/2014  . Colon polyps 05/14/2014  . Chronic prostatitis 05/14/2014  . Hypertension 05/14/2014  . BPH (benign prostatic hyperplasia) 05/14/2014    Prior to Admission medications  Medication Sig Start Date End Date Taking? Authorizing Provider  aspirin 81 MG tablet  Take 1 tablet (81 mg total) by mouth daily. 02/08/15  Yes Adline Potter, MD  atorvastatin (LIPITOR) 40 MG tablet Take 1 tablet (40 mg total) by mouth daily. 02/08/15  Yes Adline Potter, MD  Cholecalciferol (VITAMIN D3) 2000 units TABS Take 1 capsule by mouth.   Yes Historical Provider, MD  glucose blood test strip One Touch Zero Flex glucose test strips, use as directed 11/10/14  Yes Adline Potter, MD  losartan-hydrochlorothiazide (HYZAAR) 50-12.5 MG tablet Take 1 tablet by mouth daily. 06/08/15  Yes Adline Potter, MD  metFORMIN (GLUCOPHAGE) 500 MG tablet Take 1 tablet (500 mg total) by mouth 2 (two) times daily. 02/09/15  Yes Adline Potter, MD  ONE TOUCH LANCETS MISC 1 Units by Does not apply route as directed. 09/22/14  Yes Adline Potter, MD  Ibuprofen (ADVIL MIGRAINE PO) Take 1 tablet by mouth every 4 (four) hours as needed.    Historical Provider, MD    No Known Allergies  Past Surgical History:  Procedure Laterality Date  . NO PAST SURGERIES      Social History  Substance Use Topics  . Smoking status: Current Every Day Smoker    Packs/day: 1.00    Years: 10.00    Types: Cigarettes    Start date: 02/12/2004  . Smokeless tobacco: Never Used  . Alcohol use 15.0 oz/week    25 Cans of beer per week     Comment: 4-6 beers every evening     Medication list has been reviewed and updated.   Physical Exam  Constitutional: He is oriented to person, place, and time. He appears well-developed. No distress.  HENT:  Head: Normocephalic and atraumatic.  Neck: Normal range of motion. Neck supple. Carotid bruit is not present. No thyromegaly present.  Cardiovascular: Normal rate, regular rhythm and normal heart sounds.   Pulmonary/Chest: Effort normal and breath sounds normal. No respiratory distress. He has no wheezes. He has no rales.  Abdominal: Soft. Bowel sounds are normal. He exhibits no mass. There is no hepatosplenomegaly. There is tenderness in the epigastric area. There is no  rebound, no guarding and no CVA tenderness.  Musculoskeletal: Normal range of motion. He exhibits no edema or tenderness.  Neurological: He is alert and oriented to person, place, and time. He has normal reflexes.  Skin: Skin is warm and dry. No rash noted.  Psychiatric: He has a normal mood and affect. His speech is normal and behavior is normal. Thought content normal.  Nursing note and vitals reviewed.   BP 124/72 (BP Location: Right Arm, Patient Position: Sitting, Cuff Size: Normal)   Pulse 69   Temp 98.6 F (37 C) (Oral)   Resp 16   Ht 5\' 8"  (1.727 m)   Wt 160 lb 6.4 oz (72.8 kg)   SpO2 98%   BMI 24.39 kg/m   Assessment and Plan: 1. Gastritis Begin Dexilant daily - free month given Encouraged to cut back on smoking and alcohol - H. pylori antibody, IgG - dexlansoprazole (DEXILANT) 60 MG capsule; Take 1 capsule (60 mg total) by mouth daily.  Dispense: 30 capsule; Refill: 0 - omeprazole (PRILOSEC) 40 MG capsule; Take 1 capsule (40 mg total) by mouth daily.  Dispense: 30 capsule; Refill: 1  2. Essential hypertension controlled - CBC with Differential/Platelet  3. Controlled type 2 diabetes mellitus without complication, without long-term current use of insulin (HCC) Check labs - Hemoglobin A1c  4. Abnormal  weight loss Discussed healthy diet options - will monitor closely and advise of follow up when labs return - Comprehensive metabolic panel - TSH   Halina Maidens, MD [facility]  09/20/2015

## 2015-09-21 ENCOUNTER — Other Ambulatory Visit: Payer: Self-pay | Admitting: Internal Medicine

## 2015-09-21 ENCOUNTER — Other Ambulatory Visit: Payer: Managed Care, Other (non HMO)

## 2015-09-21 DIAGNOSIS — N400 Enlarged prostate without lower urinary tract symptoms: Secondary | ICD-10-CM

## 2015-09-21 DIAGNOSIS — K2970 Gastritis, unspecified, without bleeding: Principal | ICD-10-CM

## 2015-09-21 DIAGNOSIS — B9681 Helicobacter pylori [H. pylori] as the cause of diseases classified elsewhere: Secondary | ICD-10-CM

## 2015-09-21 LAB — CBC WITH DIFFERENTIAL/PLATELET
Basophils Absolute: 0 10*3/uL (ref 0.0–0.2)
Basos: 0 %
EOS (ABSOLUTE): 0 10*3/uL (ref 0.0–0.4)
EOS: 1 %
Hematocrit: 41.6 % (ref 37.5–51.0)
Hemoglobin: 14.8 g/dL (ref 12.6–17.7)
IMMATURE GRANULOCYTES: 0 %
Immature Grans (Abs): 0 10*3/uL (ref 0.0–0.1)
Lymphocytes Absolute: 2.1 10*3/uL (ref 0.7–3.1)
Lymphs: 35 %
MCH: 34.6 pg — ABNORMAL HIGH (ref 26.6–33.0)
MCHC: 35.6 g/dL (ref 31.5–35.7)
MCV: 97 fL (ref 79–97)
MONOS ABS: 0.4 10*3/uL (ref 0.1–0.9)
Monocytes: 8 %
NEUTROS PCT: 56 %
Neutrophils Absolute: 3.3 10*3/uL (ref 1.4–7.0)
PLATELETS: 206 10*3/uL (ref 150–379)
RBC: 4.28 x10E6/uL (ref 4.14–5.80)
RDW: 13.2 % (ref 12.3–15.4)
WBC: 5.8 10*3/uL (ref 3.4–10.8)

## 2015-09-21 LAB — COMPREHENSIVE METABOLIC PANEL
ALK PHOS: 62 IU/L (ref 39–117)
ALT: 31 IU/L (ref 0–44)
AST: 33 IU/L (ref 0–40)
Albumin/Globulin Ratio: 2.1 (ref 1.2–2.2)
Albumin: 4.7 g/dL (ref 3.6–4.8)
BUN/Creatinine Ratio: 6 — ABNORMAL LOW (ref 10–24)
BUN: 5 mg/dL — ABNORMAL LOW (ref 8–27)
Bilirubin Total: 0.7 mg/dL (ref 0.0–1.2)
CO2: 24 mmol/L (ref 18–29)
CREATININE: 0.88 mg/dL (ref 0.76–1.27)
Calcium: 9.6 mg/dL (ref 8.6–10.2)
Chloride: 92 mmol/L — ABNORMAL LOW (ref 96–106)
GFR calc Af Amer: 107 mL/min/{1.73_m2} (ref >59)
GFR calc non Af Amer: 93 mL/min/{1.73_m2} (ref >59)
GLOBULIN, TOTAL: 2.2 g/dL (ref 1.5–4.5)
GLUCOSE: 98 mg/dL (ref 65–99)
Potassium: 3.8 mmol/L (ref 3.5–5.2)
SODIUM: 134 mmol/L (ref 134–144)
Total Protein: 6.9 g/dL (ref 6.0–8.5)

## 2015-09-21 LAB — H. PYLORI ANTIBODY, IGG: H PYLORI IGG: 3.8 U/mL — AB (ref 0.0–0.8)

## 2015-09-21 LAB — TSH: TSH: 1.76 u[IU]/mL (ref 0.450–4.500)

## 2015-09-21 LAB — HEMOGLOBIN A1C
ESTIMATED AVERAGE GLUCOSE: 134 mg/dL
HEMOGLOBIN A1C: 6.3 % — AB (ref 4.8–5.6)

## 2015-09-21 MED ORDER — CLARITHROMYCIN 500 MG PO TABS
500.0000 mg | ORAL_TABLET | Freq: Two times a day (BID) | ORAL | 0 refills | Status: DC
Start: 2015-09-21 — End: 2016-05-20

## 2015-09-21 MED ORDER — AMOXICILLIN 500 MG PO CAPS: 1000.0000 mg | ORAL_CAPSULE | Freq: Two times a day (BID) | ORAL | 0 refills | Status: DC

## 2015-09-22 LAB — PSA: Prostate Specific Ag, Serum: 2.2 ng/mL (ref 0.0–4.0)

## 2015-09-28 ENCOUNTER — Ambulatory Visit (INDEPENDENT_AMBULATORY_CARE_PROVIDER_SITE_OTHER): Payer: Managed Care, Other (non HMO) | Admitting: Urology

## 2015-09-28 ENCOUNTER — Encounter: Payer: Self-pay | Admitting: Urology

## 2015-09-28 VITALS — BP 161/86 | HR 65 | Ht 68.0 in | Wt 165.5 lb

## 2015-09-28 DIAGNOSIS — N528 Other male erectile dysfunction: Secondary | ICD-10-CM

## 2015-09-28 DIAGNOSIS — N529 Male erectile dysfunction, unspecified: Secondary | ICD-10-CM

## 2015-09-28 DIAGNOSIS — Z8042 Family history of malignant neoplasm of prostate: Secondary | ICD-10-CM

## 2015-09-28 DIAGNOSIS — N400 Enlarged prostate without lower urinary tract symptoms: Secondary | ICD-10-CM

## 2015-09-28 DIAGNOSIS — N401 Enlarged prostate with lower urinary tract symptoms: Secondary | ICD-10-CM

## 2015-09-28 DIAGNOSIS — N138 Other obstructive and reflux uropathy: Secondary | ICD-10-CM

## 2015-09-28 LAB — BLADDER SCAN AMB NON-IMAGING: Scan Result: 20

## 2015-09-28 MED ORDER — TADALAFIL 20 MG PO TABS
20.0000 mg | ORAL_TABLET | Freq: Every day | ORAL | 12 refills | Status: DC | PRN
Start: 2015-09-28 — End: 2018-05-05

## 2015-09-28 NOTE — Progress Notes (Signed)
09/28/2015 9:14 AM   [name] 03-12-1955 [id number]  Referring provider: Adline Potter, MD [address] [place], [place] [address]  Chief Complaint  Patient presents with  . Benign Prostatic Hypertrophy    1 YEAR FOLLOW UP   . Erectile Dysfunction    HPI: Mr. Calvin Sanders is a 60 year old Caucasian male with a history of ED and BPH with LUTS who presents today for his yearly follow up.    His IPSS score today is 1, which is mild lower urinary tract symptomatology. He is pleased with his quality life due to his urinary symptoms. His PVR is 20 mL.  His previous IPSS score was 12/1.  His previous PVR was 25 mL.    His major complaint today frequency, but this is not bothersome to him.  He has had these symptoms for two years.  He denies any dysuria, hematuria or suprapubic pain.   He currently managing his BPH with LUTS with observation and the avoidence of bladder irritants.  His has not had any prostate surgeries or biopsies.     He also denies any recent fevers, chills, nausea or vomiting.  He has a family history of PCa, with his brother being diagnosed with PCa.         [place] Name 09/28/15 0800         International Prostate Symptom Score   How often have you had the sensation of not emptying your bladder? Not at All     How often have you had to urinate less than every two hours? Less than 1 in 5 times     How often have you found you stopped and started again several times when you urinated? Not at All     How often have you found it difficult to postpone urination? Not at All     How often have you had a weak urinary stream? Not at All     How often have you had to strain to start urination? Not at All     How many times did you typically get up at night to urinate? None     Total IPSS Score 1       Quality of Life due to urinary symptoms   If you were to spend the rest of your life with your urinary condition just the way it is now how would  you feel about that? Pleased        Score:  1-7 Mild 8-19 Moderate 20-35 Severe  His SHIM score is 22, which is no ED.   He has been having difficulty with erections for several years.   His previous score was 13.  His major complaint is maintaining.  His libido is good.   His risk factors for ED are smoking, DM, BPH, alcohol abuse and HTN.  He denies any painful erections or curvatures with his erections.   He has tried Cialis and Viagra in the past.  Cialis is the most effective.      SHIM    Row Name 09/28/15 0849         SHIM: Over the last 6 months:   How do you rate your confidence that you could get and keep an erection? Moderate     When you had erections with sexual stimulation, how often were your erections hard enough for penetration (entering your partner)? Almost Always or Always     During sexual intercourse, how often were  you able to maintain your erection after you had penetrated (entered) your partner? Not Difficult     During sexual intercourse, how difficult was it to maintain your erection to completion of intercourse? Slightly Difficult     When you attempted sexual intercourse, how often was it satisfactory for you? Not Difficult       SHIM Total Score   SHIM 22        Score: 1-7 Severe ED 8-11 Moderate ED 12-16 Mild-Moderate ED 17-21 Mild ED 22-25 No ED   PMH: Past Medical History:  Diagnosis Date  . Diabetes mellitus without complication ([place])   . Hypertension     Surgical History: Past Surgical History:  Procedure Laterality Date  . NO PAST SURGERIES      Home Medications:    Medication List       Accurate as of 09/28/15  9:14 AM. Always use your most recent med list.          ADVIL MIGRAINE PO Take 1 tablet by mouth every 4 (four) hours as needed.   amoxicillin 500 MG capsule Commonly known as:  AMOXIL Take 2 capsules (1,000 mg total) by mouth 2 (two) times daily.   aspirin 81 MG tablet Take 1 tablet (81 mg total) by mouth  daily.   atorvastatin 40 MG tablet Commonly known as:  LIPITOR Take 1 tablet (40 mg total) by mouth daily.   clarithromycin 500 MG tablet Commonly known as:  BIAXIN Take 1 tablet (500 mg total) by mouth 2 (two) times daily.   dexlansoprazole 60 MG capsule Commonly known as:  DEXILANT Take 1 capsule (60 mg total) by mouth daily.   glucose blood test strip One Touch Zero Flex glucose test strips, use as directed   losartan-hydrochlorothiazide 50-12.5 MG tablet Commonly known as:  HYZAAR Take 1 tablet by mouth daily.   metFORMIN 500 MG tablet Commonly known as:  GLUCOPHAGE Take 1 tablet (500 mg total) by mouth 2 (two) times daily.   omeprazole 40 MG capsule Commonly known as:  PRILOSEC Take 1 capsule (40 mg total) by mouth daily.   ONE TOUCH LANCETS Misc 1 Units by Does not apply route as directed.   tadalafil 20 MG tablet Commonly known as:  CIALIS Take 1 tablet (20 mg total) by mouth daily as needed for erectile dysfunction.   Vitamin D3 2000 units Tabs Take 1 capsule by mouth.       Allergies: No Known Allergies  Family History: Family History  Problem Relation Age of Onset  . Diabetes Mother   . Cancer Father     bone  . Heart disease Brother   . Prostate cancer Brother   . Kidney cancer Neg Hx   . Kidney disease Neg Hx     Social History:  reports that he has been smoking Cigarettes.  He started smoking about 11 years ago. He has a 10.00 pack-year smoking history. He has never used smokeless tobacco. He reports that he drinks about 15.0 oz of alcohol per week . He reports that he does not use drugs.  ROS: UROLOGY Frequent Urination?: No Hard to postpone urination?: No Burning/pain with urination?: No Get up at night to urinate?: No Leakage of urine?: No Urine stream starts and stops?: No Trouble starting stream?: No Do you have to strain to urinate?: No Blood in urine?: No Urinary tract infection?: No Sexually transmitted disease?: No Injury to  kidneys or bladder?: No Painful intercourse?: No Weak stream?: No Erection problems?:  No Penile pain?: No  Gastrointestinal Nausea?: No Vomiting?: No Indigestion/heartburn?: No Diarrhea?: No Constipation?: No  Constitutional Fever: No Night sweats?: No Weight loss?: No Fatigue?: No  Skin Skin rash/lesions?: No Itching?: No  Eyes Blurred vision?: No Double vision?: No  Ears/Nose/Throat Sore throat?: No Sinus problems?: No  Hematologic/Lymphatic Swollen glands?: No Easy bruising?: No  Cardiovascular Leg swelling?: No Chest pain?: No  Respiratory Cough?: No Shortness of breath?: No  Endocrine Excessive thirst?: No  Musculoskeletal Back pain?: No Joint pain?: No  Neurological Headaches?: No Dizziness?: No  Psychologic Depression?: No Anxiety?: No  Physical Exam: BP (!) 161/86   Pulse 65   Ht 5\' 8"  (1.727 m)   Wt 165 lb 8 oz (75.1 kg)   BMI 25.16 kg/m   Constitutional: Well nourished. Alert and oriented, No acute distress. HEENT: [place] AT, moist mucus membranes. Trachea midline, no masses. Cardiovascular: No clubbing, cyanosis, or edema. Respiratory: Normal respiratory effort, no increased work of breathing. GI: Abdomen is soft, non tender, non distended, no abdominal masses. Liver and spleen not palpable.  No hernias appreciated.  Stool sample for occult testing is not indicated.   GU: No CVA tenderness.  No bladder fullness or masses.  Patient with circumcised phallus.  Urethral meatus is patent.  No penile discharge. No penile lesions or rashes. Scrotum without lesions, cysts, rashes and/or edema.  Testicles are located scrotally bilaterally. No masses are appreciated in the testicles. Left and right epididymis are normal. Rectal: Patient with  normal sphincter tone. Anus and perineum without scarring or rashes. No rectal masses are appreciated. Prostate is approximately 60 grams, no nodules are appreciated. Seminal vesicles are normal. Skin: No  rashes, bruises or suspicious lesions. Lymph: No cervical or inguinal adenopathy. Neurologic: Grossly intact, no focal deficits, moving all 4 extremities. Psychiatric: Normal mood and affect.   Laboratory Data: Results for orders placed or performed in visit on 09/28/15  BLADDER SCAN AMB NON-IMAGING  Result Value Ref Range   Scan Result 20    Lab Results  Component Value Date   WBC 5.8 09/20/2015   HCT 41.6 09/20/2015   MCV 97 09/20/2015   PLT 206 09/20/2015    Lab Results  Component Value Date   CREATININE 0.88 09/20/2015    PSA History  1.5 ng/mL on 07/16/2013  1.7 ng/mL on 09/21/2014  2.2 ng/mL on 09/21/2015  Lab Results  Component Value Date   HGBA1C 6.3 (H) 09/20/2015    Pertinent Imaging: Results for ABBOTT, MICHAELIS (MRN [id number]) as of 09/28/2015 09:00  Ref. Range 09/28/2015 08:50  Scan Result Unknown 20   Assessment & Plan:    1. BPH with LUTS  - IPSS score is 1/1,  it is improving  - Continue conservative management, avoiding bladder irritants and timed voiding's  - RTC in 12 months for IPSS, PSA and exam   - BLADDER SCAN AMB NON-IMAGING   2. Erectile dysfunction  - SHIM score is 22  - Continue Cialis, meds refilled  - RTC in 12 months for repeat SHIM score and exam, as testosterone therapy can affect erectile function  3. Family history of PCa:   Patient's brother with a h/o PCa.     Return in about 1 year (around 09/27/2016) for IPSS, SHIM, PSA  and exam.  Zara Council, [facility]  [facility] [address] [place], [place] [address] [phone]

## 2015-10-12 ENCOUNTER — Other Ambulatory Visit: Payer: Self-pay | Admitting: Internal Medicine

## 2015-10-12 DIAGNOSIS — B9681 Helicobacter pylori [H. pylori] as the cause of diseases classified elsewhere: Secondary | ICD-10-CM

## 2015-10-12 DIAGNOSIS — K2970 Gastritis, unspecified, without bleeding: Principal | ICD-10-CM

## 2015-11-15 ENCOUNTER — Other Ambulatory Visit: Payer: Self-pay | Admitting: Internal Medicine

## 2015-11-15 DIAGNOSIS — K2970 Gastritis, unspecified, without bleeding: Secondary | ICD-10-CM

## 2016-03-05 ENCOUNTER — Other Ambulatory Visit: Payer: Self-pay | Admitting: Internal Medicine

## 2016-03-14 ENCOUNTER — Telehealth: Payer: Self-pay

## 2016-03-14 MED ORDER — ATORVASTATIN CALCIUM 40 MG PO TABS: 40.0000 mg | ORAL_TABLET | Freq: Every day | ORAL | 0 refills | Status: DC

## 2016-03-14 MED ORDER — LOSARTAN POTASSIUM-HCTZ 50-12.5 MG PO TABS: 1.0000 | ORAL_TABLET | Freq: Every day | ORAL | 0 refills | Status: DC

## 2016-03-18 NOTE — Telephone Encounter (Signed)
Pt is out of town for the next 3 weeks said he would call back next month to make this appt

## 2016-03-20 NOTE — Telephone Encounter (Signed)
error 

## 2016-05-20 ENCOUNTER — Encounter: Payer: Self-pay | Admitting: Family Medicine

## 2016-05-20 ENCOUNTER — Ambulatory Visit (INDEPENDENT_AMBULATORY_CARE_PROVIDER_SITE_OTHER): Payer: 59 | Admitting: Family Medicine

## 2016-05-20 VITALS — BP 160/72 | HR 74 | Ht 68.0 in | Wt 170.6 lb

## 2016-05-20 DIAGNOSIS — E782 Mixed hyperlipidemia: Secondary | ICD-10-CM

## 2016-05-20 DIAGNOSIS — E559 Vitamin D deficiency, unspecified: Secondary | ICD-10-CM

## 2016-05-20 DIAGNOSIS — E663 Overweight: Secondary | ICD-10-CM | POA: Diagnosis not present

## 2016-05-20 DIAGNOSIS — R351 Nocturia: Secondary | ICD-10-CM

## 2016-05-20 DIAGNOSIS — F1010 Alcohol abuse, uncomplicated: Secondary | ICD-10-CM

## 2016-05-20 DIAGNOSIS — N401 Enlarged prostate with lower urinary tract symptoms: Secondary | ICD-10-CM | POA: Diagnosis not present

## 2016-05-20 DIAGNOSIS — Z79899 Other long term (current) drug therapy: Secondary | ICD-10-CM | POA: Diagnosis not present

## 2016-05-20 DIAGNOSIS — E119 Type 2 diabetes mellitus without complications: Secondary | ICD-10-CM

## 2016-05-20 DIAGNOSIS — I10 Essential (primary) hypertension: Secondary | ICD-10-CM

## 2016-05-20 DIAGNOSIS — F17200 Nicotine dependence, unspecified, uncomplicated: Secondary | ICD-10-CM | POA: Diagnosis not present

## 2016-05-20 DIAGNOSIS — H9202 Otalgia, left ear: Secondary | ICD-10-CM

## 2016-05-20 MED ORDER — ACETAMINOPHEN 500 MG PO TABS: 1000.0000 mg | ORAL_TABLET | Freq: Three times a day (TID) | ORAL | 0 refills | Status: DC | PRN

## 2016-05-20 NOTE — Patient Instructions (Addendum)
Avoid ibuprofen and Aleve, try Tylenol Extra-Strength instead. Try stopping Dexilant, may restart if heartburn symptoms become severe. Try Sudafed 12h twice daily for 3 days to see if helps with left ear, keep ENT appointment if symptoms do not resolve.

## 2016-05-21 LAB — LIPID PANEL
CHOLESTEROL TOTAL: 176 mg/dL (ref 100–199)
Chol/HDL Ratio: 2.4 ratio (ref 0.0–5.0)
HDL: 72 mg/dL (ref >39)
LDL CALC: 78 mg/dL (ref 0–99)
Triglycerides: 128 mg/dL (ref 0–149)
VLDL Cholesterol Cal: 26 mg/dL (ref 5–40)

## 2016-05-21 LAB — MICROALBUMIN / CREATININE URINE RATIO
CREATININE, UR: 49 mg/dL
MICROALBUM., U, RANDOM: 21.5 ug/mL
Microalb/Creat Ratio: 43.9 mg/g creat — ABNORMAL HIGH (ref 0.0–30.0)

## 2016-05-21 LAB — COMPREHENSIVE METABOLIC PANEL
A/G RATIO: 1.9 (ref 1.2–2.2)
ALT: 50 IU/L — ABNORMAL HIGH (ref 0–44)
AST: 31 IU/L (ref 0–40)
Albumin: 4.7 g/dL (ref 3.6–4.8)
Alkaline Phosphatase: 68 IU/L (ref 39–117)
BILIRUBIN TOTAL: 0.7 mg/dL (ref 0.0–1.2)
BUN/Creatinine Ratio: 10 (ref 10–24)
BUN: 11 mg/dL (ref 8–27)
CO2: 22 mmol/L (ref 18–29)
Calcium: 9.4 mg/dL (ref 8.6–10.2)
Chloride: 90 mmol/L — ABNORMAL LOW (ref 96–106)
Creatinine, Ser: 1.11 mg/dL (ref 0.76–1.27)
GFR calc Af Amer: 82 mL/min/{1.73_m2} (ref >59)
GFR calc non Af Amer: 71 mL/min/{1.73_m2} (ref >59)
GLOBULIN, TOTAL: 2.5 g/dL (ref 1.5–4.5)
Glucose: 136 mg/dL — ABNORMAL HIGH (ref 65–99)
POTASSIUM: 4.5 mmol/L (ref 3.5–5.2)
SODIUM: 132 mmol/L — AB (ref 134–144)
Total Protein: 7.2 g/dL (ref 6.0–8.5)

## 2016-05-21 LAB — VITAMIN B12: Vitamin B-12: 288 pg/mL (ref 232–1245)

## 2016-05-21 LAB — HEMOGLOBIN A1C
Est. average glucose Bld gHb Est-mCnc: 169 mg/dL
HEMOGLOBIN A1C: 7.5 % — AB (ref 4.8–5.6)

## 2016-05-21 LAB — VITAMIN D 25 HYDROXY (VIT D DEFICIENCY, FRACTURES): VIT D 25 HYDROXY: 34.5 ng/mL (ref 30.0–100.0)

## 2016-05-21 NOTE — Progress Notes (Signed)
Date:  05/20/2016   Name:  Calvin Sanders   DOB:  02/14/1955   MRN:  [id number]  PCP:  Adline Potter, MD    Chief Complaint: Hypertension (Follow up.); Diabetes; and ear pressure (Left ear feels like has a lot of pressure. Popping in ear constantly. X 6 months +)   History of Present Illness:  This is a 61 y.o. male seen for one year f/u. Dx'd with H. pylori in August, rx'd with abxs and PPI, on Dexilant only now, no further sxs. T2DM with last a1c 6.3% in August, not checking sugars at home. B hand numbness stable. HLD on Lipitor, BPH on Cialis, tolerating well. Still smoking, understands need to quit. Limiting self to 2 beers/day during week, more on weekends. On vit D supp. C/o L ear fullness/pressure for several months, no allergy sxs.  Review of Systems:  Review of Systems  Constitutional: Negative for chills and fever.  HENT: Negative for ear discharge, facial swelling, sinus pain and sore throat.   Eyes: Negative for pain.  Respiratory: Negative for cough and shortness of breath.   Cardiovascular: Negative for chest pain and leg swelling.  Gastrointestinal: Negative for abdominal pain.  Endocrine: Negative for polydipsia and polyuria.  Genitourinary: Negative for difficulty urinating.  Musculoskeletal: Negative for myalgias.  Neurological: Negative for syncope and light-headedness.    Patient Active Problem List   Diagnosis Date Noted  . Vitamin D deficiency 02/08/2015  . Overweight (BMI 25.0-29.9) 02/07/2015  . 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 Family history of prostate cancer 09/06/2014  . Diabetes mellitus type 2, controlled, without complications ([place]) 01/75/1025  . Hyperlipemia, mixed 08/05/2014  . Smoker 08/05/2014  . Excessive drinking of alcohol 08/05/2014  . Colon polyps 05/14/2014  . Chronic prostatitis 05/14/2014  . Hypertension 05/14/2014  . BPH (benign prostatic hyperplasia) 05/14/2014    Prior to Admission medications   Medication  Sig Start Date End Date Taking? Authorizing Provider  aspirin 81 MG tablet Take 1 tablet (81 mg total) by mouth daily. 02/08/15  Yes Adline Potter, MD  atorvastatin (LIPITOR) 40 MG tablet Take 1 tablet (40 mg total) by mouth daily. 03/14/16  Yes Adline Potter, MD  Cholecalciferol (VITAMIN D3) 2000 units TABS Take 1 capsule by mouth.   Yes Historical Provider, MD  glucose blood test strip One Touch Zero Flex glucose test strips, use as directed 11/10/14  Yes Adline Potter, MD  losartan-hydrochlorothiazide (HYZAAR) 50-12.5 MG tablet Take 1 tablet by mouth daily. 03/14/16  Yes Adline Potter, MD  metFORMIN (GLUCOPHAGE) 500 MG tablet Take 1 tablet (500 mg total) by mouth 2 (two) times daily. 02/09/15  Yes Adline Potter, MD  ONE TOUCH LANCETS MISC 1 Units by Does not apply route as directed. 09/22/14  Yes Adline Potter, MD  tadalafil (CIALIS) 20 MG tablet Take 1 tablet (20 mg total) by mouth daily as needed for erectile dysfunction. 09/28/15  Yes Shannon A McGowan, PA-C  acetaminophen (TYLENOL) 500 MG tablet Take 2 tablets (1,000 mg total) by mouth every 8 (eight) hours as needed. 05/20/16   Adline Potter, MD    No Known Allergies  Past Surgical History:  Procedure Laterality Date  . NO PAST SURGERIES      Social History  Substance Use Topics  . Smoking status: Current Every Day Smoker    Packs/day: 1.00    Years: 10.00    Types: Cigarettes    Start date: 02/12/2004  . Smokeless tobacco: Never Used  . Alcohol use 15.0  oz/week    25 Cans of beer per week     Comment: 4-6 beers every evening    Family History  Problem Relation Age of Onset  . Diabetes Mother   . Cancer Father     bone  . Heart disease Brother   . Prostate cancer Brother   . Kidney cancer Neg Hx   . Kidney disease Neg Hx     Medication list has been reviewed and updated.  Physical Examination: BP (!) 160/72 (BP Location: Right Arm, Patient Position: Sitting, Cuff Size: Normal)   Pulse 74   Ht 5\' 8"  (1.727 m)   Wt 170 lb  9.6 oz (77.4 kg)   SpO2 98%   BMI 25.94 kg/m   Physical Exam  Constitutional: He appears well-developed and well-nourished.  HENT:  Right Ear: External ear normal.  Left Ear: External ear normal.  Nose: Nose normal.  Mouth/Throat: Oropharynx is clear and moist.  L TM with fluid, retracted with central scarring R TM unremarkable  Cardiovascular: Normal rate, regular rhythm and normal heart sounds.   Pulmonary/Chest: Effort normal and breath sounds normal.  Musculoskeletal: He exhibits no edema.  Lymphadenopathy:    He has no cervical adenopathy.  Neurological: He is alert.  Skin: Skin is warm and dry.  Psychiatric: He has a normal mood and affect. His behavior is normal.  Nursing note and vitals reviewed.   Assessment and Plan:  1. Controlled type 2 diabetes mellitus without complication, without long-term current use of insulin (HCC) Unclear control on metformin - HgB A1c - Urine Microalbumin w/creat. ratio  2. Left ear pain Persistent sxs with abnormal exam, trial Sudafed - Ambulatory referral to ENT  3. Essential hypertension 148/64 repeat, consider increased Hyzaar if labs ok - Comprehensive Metabolic Panel (CMET)  4. Benign prostatic hyperplasia with nocturia Well controlled on Cialis, urology following  5. Hyperlipemia, mixed Well controlled on Lipitor - Lipid Profile  6. Smoker Strongly advised cessation  7. Excessive drinking of alcohol Advised limit to 2 beers daily  8. Overweight (BMI 25.0-29.9) Weight stable, exercise/weight loss discussed  9. Vitamin D deficiency On supplement - Vitamin D (25 hydroxy)  10. Current use of proton pump inhibitor - B12  Return in about 3 months (around 08/19/2016).  Satira Anis. [facility]  05/21/2016

## 2016-05-22 ENCOUNTER — Other Ambulatory Visit: Payer: Self-pay | Admitting: Family Medicine

## 2016-05-22 ENCOUNTER — Encounter: Payer: Self-pay | Admitting: Family Medicine

## 2016-05-22 DIAGNOSIS — E538 Deficiency of other specified B group vitamins: Secondary | ICD-10-CM | POA: Insufficient documentation

## 2016-05-22 MED ORDER — VITAMIN B-12 1000 MCG PO TABS: 1000.0000 ug | ORAL_TABLET | Freq: Every day | ORAL | Status: DC

## 2016-05-22 MED ORDER — LOSARTAN POTASSIUM-HCTZ 100-25 MG PO TABS: 1.0000 | ORAL_TABLET | Freq: Every day | ORAL | 3 refills | Status: DC

## 2016-06-17 ENCOUNTER — Ambulatory Visit: Payer: Managed Care, Other (non HMO) | Admitting: Family Medicine

## 2016-07-01 ENCOUNTER — Encounter: Payer: Self-pay | Admitting: Family Medicine

## 2016-07-01 ENCOUNTER — Ambulatory Visit (INDEPENDENT_AMBULATORY_CARE_PROVIDER_SITE_OTHER): Payer: 59 | Admitting: Family Medicine

## 2016-07-01 VITALS — BP 140/78 | HR 70 | Temp 97.9°F | Resp 16 | Ht 68.0 in | Wt 170.8 lb

## 2016-07-01 DIAGNOSIS — R351 Nocturia: Secondary | ICD-10-CM

## 2016-07-01 DIAGNOSIS — E538 Deficiency of other specified B group vitamins: Secondary | ICD-10-CM

## 2016-07-01 DIAGNOSIS — H6522 Chronic serous otitis media, left ear: Secondary | ICD-10-CM

## 2016-07-01 DIAGNOSIS — J40 Bronchitis, not specified as acute or chronic: Secondary | ICD-10-CM | POA: Diagnosis not present

## 2016-07-01 DIAGNOSIS — F17200 Nicotine dependence, unspecified, uncomplicated: Secondary | ICD-10-CM

## 2016-07-01 DIAGNOSIS — I10 Essential (primary) hypertension: Secondary | ICD-10-CM | POA: Diagnosis not present

## 2016-07-01 DIAGNOSIS — E559 Vitamin D deficiency, unspecified: Secondary | ICD-10-CM | POA: Diagnosis not present

## 2016-07-01 DIAGNOSIS — N401 Enlarged prostate with lower urinary tract symptoms: Secondary | ICD-10-CM | POA: Diagnosis not present

## 2016-07-01 DIAGNOSIS — Z79899 Other long term (current) drug therapy: Secondary | ICD-10-CM | POA: Diagnosis not present

## 2016-07-01 DIAGNOSIS — E782 Mixed hyperlipidemia: Secondary | ICD-10-CM | POA: Diagnosis not present

## 2016-07-01 DIAGNOSIS — E663 Overweight: Secondary | ICD-10-CM | POA: Diagnosis not present

## 2016-07-01 DIAGNOSIS — E119 Type 2 diabetes mellitus without complications: Secondary | ICD-10-CM | POA: Diagnosis not present

## 2016-07-01 MED ORDER — AZITHROMYCIN 250 MG PO TABS: ORAL_TABLET | ORAL | 0 refills | Status: DC

## 2016-07-01 NOTE — Patient Instructions (Addendum)
Wean off Dexilant (dexlansoprazole) if still taking. Take one capsule every other day for two weeks then one capsule every third day for two weeks then stop. Call if left ear symptoms persist.

## 2016-07-01 NOTE — Progress Notes (Signed)
Date:  07/01/2016   Name:  Calvin Sanders   DOB:  06-27-1955   MRN:  [id number]  PCP:  Adline Potter, MD    Chief Complaint: Cough (3-4 weeks of nagging cough. )   History of Present Illness:  This is a 61 y.o. male seen for 3 wk hx cough, getting better slowly, occ clear phlegm. Also still with L ear congestion/popping despite Sudafed. Hyzaar increased last visit due to elevated MCR and BP, tolerating well. Taking B12 supp for low B12 level, unsure if Dexilant has been stopped as wife does meds. Prostate sxs stable, HLD well controlled on Lipitor. Still smoking and drinking about 4 beers/night.  Review of Systems:  Review of Systems  Constitutional: Negative for chills and fever.  HENT: Negative for ear discharge, sinus pain and trouble swallowing.   Respiratory: Negative for shortness of breath.   Cardiovascular: Negative for chest pain and leg swelling.  Genitourinary: Negative for difficulty urinating.  Neurological: Negative for syncope and light-headedness.    Patient Active Problem List   Diagnosis Date Noted  . B12 deficiency 05/22/2016  . Vitamin D deficiency 02/08/2015  . Overweight (BMI 25.0-29.9) 02/07/2015  . 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 Family history of prostate cancer 09/06/2014  . Diabetes mellitus type 2, controlled, without complications ([place]) 86/57/8469  . Hyperlipemia, mixed 08/05/2014  . Smoker 08/05/2014  . Excessive drinking of alcohol 08/05/2014  . Colon polyps 05/14/2014  . Chronic prostatitis 05/14/2014  . Hypertension 05/14/2014  . BPH (benign prostatic hyperplasia) 05/14/2014    Prior to Admission medications   Medication Sig Start Date End Date Taking? Authorizing Provider  acetaminophen (TYLENOL) 500 MG tablet Take 2 tablets (1,000 mg total) by mouth every 8 (eight) hours as needed. 05/20/16  Yes Shauntell Iglesia, Gwyndolyn Saxon, MD  aspirin 81 MG tablet Take 1 tablet (81 mg total) by mouth daily. 02/08/15  Yes Korrin Waterfield, Gwyndolyn Saxon, MD   atorvastatin (LIPITOR) 40 MG tablet Take 1 tablet (40 mg total) by mouth daily. 03/14/16  Yes Jeydan Barner, Gwyndolyn Saxon, MD  Cholecalciferol (VITAMIN D3) 2000 units TABS Take 1 capsule by mouth.   Yes [provider]  glucose blood test strip One Touch Zero Flex glucose test strips, use as directed 11/10/14  Yes Ted Leonhart, Gwyndolyn Saxon, MD  losartan-hydrochlorothiazide (HYZAAR) 100-25 MG tablet Take 1 tablet by mouth daily. 05/22/16  Yes Ras Kollman, Gwyndolyn Saxon, MD  metFORMIN (GLUCOPHAGE) 500 MG tablet Take 1 tablet (500 mg total) by mouth 2 (two) times daily. 02/09/15  Yes Dacen Frayre, Gwyndolyn Saxon, MD  ONE TOUCH LANCETS MISC 1 Units by Does not apply route as directed. 09/22/14  Yes Xaiden Fleig, Gwyndolyn Saxon, MD  tadalafil (CIALIS) 20 MG tablet Take 1 tablet (20 mg total) by mouth daily as needed for erectile dysfunction. 09/28/15  Yes McGowan, Larene Beach A, PA-C  vitamin B-12 (CYANOCOBALAMIN) 1000 MCG tablet Take 1 tablet (1,000 mcg total) by mouth daily. 05/22/16  Yes Docie Abramovich, Gwyndolyn Saxon, MD  azithromycin (ZITHROMAX) 250 MG tablet Take two tablets today then one tablet daily for four days 07/01/16   Adline Potter, MD    No Known Allergies  Past Surgical History:  Procedure Laterality Date  . NO PAST SURGERIES      Social History  Substance Use Topics  . Smoking status: Current Every Day Smoker    Packs/day: 0.50    Years: 10.00    Types: Cigarettes    Start date: 02/12/2004  . Smokeless tobacco: Never Used  . Alcohol use 15.0 oz/week    25 Cans of  beer per week     Comment: 4-6 beers every evening    Family History  Problem Relation Age of Onset  . Diabetes Mother   . Cancer Father        bone  . Heart disease Brother   . Prostate cancer Brother   . Kidney cancer Neg Hx   . Kidney disease Neg Hx     Medication list has been reviewed and updated.  Physical Examination: BP 140/78   Pulse 70   Temp 97.9 F (36.6 C)   Resp 16   Ht 5\' 8"  (1.727 m)   Wt 170 lb 12.8 oz (77.5 kg)   SpO2 96%   BMI 25.97 kg/m   Physical  Exam  Constitutional: He appears well-developed and well-nourished.  HENT:  Right Ear: External ear normal.  Left Ear: External ear normal.  L TM injected with fluid  Cardiovascular: Normal rate, regular rhythm and normal heart sounds.   Pulmonary/Chest: Effort normal and breath sounds normal.  Musculoskeletal: He exhibits no edema.  Neurological: He is alert.  Skin: Skin is warm and dry.  Psychiatric: He has a normal mood and affect. His behavior is normal.  Nursing note and vitals reviewed.   Assessment and Plan:  1. Bronchitis Improving, should resolve  2. Chronic serous otitis media, left ear Trial Zpak, consider ENT referral if sxs persist  3. Essential hypertension Improved on increased Hyzaar  4. Controlled type 2 diabetes mellitus without complication, without long-term current use of insulin (HCC) Adequate control on metformin  5. Benign prostatic hyperplasia with nocturia Stable on Cialis, followed by urology  6. Hyperlipemia, mixed Well controlled on Lipitor  7. Smoker Strongly advised cessation  8. Overweight (BMI 25.0-29.9) Weight stable, exercise/weight loss discussed  9. Vitamin D deficiency Well controlled on supplement  10. B12 deficiency On supplement  11. Current use of proton pump inhibitor Wean off Dexilant if still taking  F/u as scheduled in 6 weeks, plan a1c/MCR/B12 then  Satira Anis. [facility]  07/01/2016

## 2016-08-12 ENCOUNTER — Other Ambulatory Visit: Payer: Self-pay | Admitting: Family Medicine

## 2016-09-16 ENCOUNTER — Ambulatory Visit (INDEPENDENT_AMBULATORY_CARE_PROVIDER_SITE_OTHER): Payer: 59 | Admitting: Family Medicine

## 2016-09-16 ENCOUNTER — Encounter: Payer: Self-pay | Admitting: Family Medicine

## 2016-09-16 VITALS — BP 140/88 | HR 75 | Resp 16 | Ht 68.0 in | Wt 163.0 lb

## 2016-09-16 DIAGNOSIS — F17200 Nicotine dependence, unspecified, uncomplicated: Secondary | ICD-10-CM

## 2016-09-16 DIAGNOSIS — Z23 Encounter for immunization: Secondary | ICD-10-CM

## 2016-09-16 DIAGNOSIS — E663 Overweight: Secondary | ICD-10-CM

## 2016-09-16 DIAGNOSIS — E559 Vitamin D deficiency, unspecified: Secondary | ICD-10-CM

## 2016-09-16 DIAGNOSIS — R351 Nocturia: Secondary | ICD-10-CM

## 2016-09-16 DIAGNOSIS — N401 Enlarged prostate with lower urinary tract symptoms: Secondary | ICD-10-CM

## 2016-09-16 DIAGNOSIS — E782 Mixed hyperlipidemia: Secondary | ICD-10-CM | POA: Diagnosis not present

## 2016-09-16 DIAGNOSIS — E538 Deficiency of other specified B group vitamins: Secondary | ICD-10-CM

## 2016-09-16 DIAGNOSIS — I10 Essential (primary) hypertension: Secondary | ICD-10-CM | POA: Diagnosis not present

## 2016-09-16 DIAGNOSIS — E119 Type 2 diabetes mellitus without complications: Secondary | ICD-10-CM

## 2016-09-16 DIAGNOSIS — Z0000 Encounter for general adult medical examination without abnormal findings: Secondary | ICD-10-CM

## 2016-09-16 MED ORDER — ZOSTER VAC RECOMB ADJUVANTED 50 MCG/0.5ML IM SUSR: 0.5000 mL | Freq: Once | INTRAMUSCULAR | 1 refills | Status: AC

## 2016-09-16 NOTE — Progress Notes (Signed)
Date:  09/16/2016   Name:  Calvin Sanders   DOB:  1955/05/06   MRN:  [id number]  PCP:  Adline Potter, MD    Chief Complaint: Hypertension   History of Present Illness:  This is a 61 y.o. male seen for 3 month f/u. Bronchitis and sinus sxs resolved. T2DM on metformin, not checking sugars at home. BPH/ED sxs stable on Cialis per urology. HLD on Lipitor. Still smoking 1/2 ppd, doubts can quit. Taking vit D and B12 supps, off PPI. Saw optho within past year. No imms past 10 yrs. Unsure why needs to be seen every 3 months.  Review of Systems:  Review of Systems  Constitutional: Negative for chills and fever.  Respiratory: Negative for cough and shortness of breath.   Cardiovascular: Negative for chest pain and leg swelling.  Endocrine: Negative for polydipsia and polyuria.  Genitourinary: Negative for difficulty urinating.  Neurological: Negative for syncope and light-headedness.    Patient Active Problem List   Diagnosis Date Noted  . B12 deficiency 05/22/2016  . Vitamin D deficiency 02/08/2015  . Overweight (BMI 25.0-29.9) 02/07/2015  . 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 Family history of prostate cancer 09/06/2014  . Diabetes mellitus type 2, controlled, without complications ([place]) 08/11/1599  . Hyperlipemia, mixed 08/05/2014  . Smoker 08/05/2014  . Excessive drinking of alcohol 08/05/2014  . Colon polyps 05/14/2014  . Chronic prostatitis 05/14/2014  . Hypertension 05/14/2014  . BPH (benign prostatic hyperplasia) 05/14/2014    Prior to Admission medications   Medication Sig Start Date End Date Taking? Authorizing Provider  acetaminophen (TYLENOL) 500 MG tablet Take 2 tablets (1,000 mg total) by mouth every 8 (eight) hours as needed. 05/20/16  Yes Marciano Mundt, Gwyndolyn Saxon, MD  aspirin 81 MG tablet Take 1 tablet (81 mg total) by mouth daily. 02/08/15  Yes Jabreel Chimento, Gwyndolyn Saxon, MD  atorvastatin (LIPITOR) 40 MG tablet Take 1 tablet (40 mg total) by mouth daily. 03/14/16  Yes  Jakeel Starliper, Gwyndolyn Saxon, MD  Cholecalciferol (VITAMIN D3) 2000 units TABS Take 1 capsule by mouth.   Yes [provider]  glucose blood test strip One Touch Zero Flex glucose test strips, use as directed 11/10/14  Yes Vona Whiters, Gwyndolyn Saxon, MD  losartan-hydrochlorothiazide (HYZAAR) 100-25 MG tablet Take 1 tablet by mouth daily. 05/22/16  Yes Welford Christmas, Gwyndolyn Saxon, MD  metFORMIN (GLUCOPHAGE) 500 MG tablet Take 1 tablet (500 mg total) by mouth 2 (two) times daily. 02/09/15  Yes Shardea Cwynar, Gwyndolyn Saxon, MD  ONE TOUCH LANCETS MISC 1 Units by Does not apply route as directed. 09/22/14  Yes Makel Mcmann, Gwyndolyn Saxon, MD  tadalafil (CIALIS) 20 MG tablet Take 1 tablet (20 mg total) by mouth daily as needed for erectile dysfunction. 09/28/15  Yes McGowan, Larene Beach A, PA-C  vitamin B-12 (CYANOCOBALAMIN) 1000 MCG tablet Take 1 tablet (1,000 mcg total) by mouth daily. 05/22/16  Yes Oriah Leinweber, Gwyndolyn Saxon, MD  Zoster Vac Recomb Adjuvanted [facility]) injection Inject 0.5 mLs into the muscle once. 09/16/16 09/16/16  Adline Potter, MD    No Known Allergies  Past Surgical History:  Procedure Laterality Date  . NO PAST SURGERIES      Social History  Substance Use Topics  . Smoking status: Current Every Day Smoker    Packs/day: 0.50    Years: 10.00    Types: Cigarettes    Start date: 02/12/2004  . Smokeless tobacco: Never Used  . Alcohol use 15.0 oz/week    25 Cans of beer per week     Comment: 4-6 beers every evening  Family History  Problem Relation Age of Onset  . Diabetes Mother   . Cancer Father        bone  . Heart disease Brother   . Prostate cancer Brother   . Kidney cancer Neg Hx   . Kidney disease Neg Hx     Medication list has been reviewed and updated.  Physical Examination: BP 140/88   Pulse 75   Resp 16   Ht 5\' 8"  (1.727 m)   Wt 163 lb (73.9 kg)   SpO2 97%   BMI 24.78 kg/m   Physical Exam  Constitutional: He appears well-developed and well-nourished.  Cardiovascular: Normal rate, regular rhythm and normal heart  sounds.   Pulmonary/Chest: Effort normal and breath sounds normal.  Musculoskeletal: He exhibits no edema.  Neurological: He is alert.  Skin: Skin is warm and dry.  Psychiatric: He has a normal mood and affect. His behavior is normal.  Nursing note and vitals reviewed.   Assessment and Plan:  1. Controlled type 2 diabetes mellitus without complication, without long-term current use of insulin (HCC) On metformin only - HgB A1c - Urine Microalbumin w/creat. ratio  2. Essential hypertension Adequate control on Hyzaar  3. Benign prostatic hyperplasia with nocturia On Cialis, followed by urology  4. Hyperlipemia, mixed Well controlled on Lipitor  5. Smoker Strongly advised cessation  6. Vitamin D deficiency Well controlled on supplement  7. B12 deficiency On supplement - B12  8. Overweight (BMI 25.0-29.9) Weight down 7#, continue exercise/wegiht loss  9. Need for pneumococcal vaccination Pneumovax in one year - Pneumococcal conjugate vaccine 13-valent  10. Need for diphtheria-tetanus-pertussis (Tdap) vaccine - Tdap vaccine greater than or equal to 7yo IM  11. Need for zoster vaccination - Zoster Vac Recomb Adjuvanted [facility]) injection; Inject 0.5 mLs into the muscle once.  Dispense: 0.5 mL; Refill: 1  12. Healthcare maintenance - Hepatitis C Antibody  Return in about 3 months (around 12/17/2016).  Satira Anis. [facility]  09/16/2016

## 2016-09-17 LAB — HEMOGLOBIN A1C
Est. average glucose Bld gHb Est-mCnc: 146 mg/dL
HEMOGLOBIN A1C: 6.7 % — AB (ref 4.8–5.6)

## 2016-09-17 LAB — MICROALBUMIN / CREATININE URINE RATIO
CREATININE, UR: 157.2 mg/dL
MICROALB/CREAT RATIO: 5.7 mg/g{creat} (ref 0.0–30.0)
MICROALBUM., U, RANDOM: 9 ug/mL

## 2016-09-17 LAB — HEPATITIS C ANTIBODY: Hep C Virus Ab: <0.1 s/co ratio (ref 0.0–0.9)

## 2016-09-17 LAB — VITAMIN B12: Vitamin B-12: 442 pg/mL (ref 232–1245)

## 2016-09-20 ENCOUNTER — Other Ambulatory Visit: Payer: Self-pay | Admitting: *Deleted

## 2016-09-20 ENCOUNTER — Other Ambulatory Visit: Payer: Managed Care, Other (non HMO)

## 2016-09-20 ENCOUNTER — Other Ambulatory Visit
Admission: RE | Admit: 2016-09-20 | Discharge: 2016-09-20 | Disposition: A | Discharge status: Home / Self Care | Payer: 59 | Source: Ambulatory Visit | Attending: Urology | Admitting: Urology

## 2016-09-20 DIAGNOSIS — N400 Enlarged prostate without lower urinary tract symptoms: Secondary | ICD-10-CM

## 2016-09-20 LAB — PSA: Prostatic Specific Antigen: 2.39 ng/mL (ref 0.00–4.00)

## 2016-09-26 NOTE — Progress Notes (Signed)
09/27/2016 8:36 AM   Calvin Sanders 01-May-1955 [id number]  Referring provider: Adline Potter, MD [address] [facility], [place] [address]  Chief Complaint  Patient presents with  . Benign Prostatic Hypertrophy    1 year follow up  . Erectile Dysfunction    HPI: Mr. Calvin Sanders is a 61 year old Caucasian male with a history of ED and BPH with LUTS who presents today for his yearly follow up.    BPH with LU TS His IPSS score today is 0, which is no lower urinary tract symptomatology. He is pleased with his quality life due to his urinary symptoms. His previous IPSS score was 1/0.  His previous PVR was 20 mL.    His major complaint today frequency, but this is not bothersome to him.  He has had these symptoms for two years.  He denies any dysuria, hematuria or suprapubic pain.   He currently managing his BPH with LUTS with observation and the avoidence of bladder irritants.  His has not had any prostate surgeries or biopsies.     He also denies any recent fevers, chills, nausea or vomiting.  He has a family history of PCa, with his brother being diagnosed with PCa.         [name] Name 09/27/16 0800         International Prostate Symptom Score   How often have you had the sensation of not emptying your bladder? Not at All     How often have you had to urinate less than every two hours? Not at All     How often have you found you stopped and started again several times when you urinated? Not at All     How often have you found it difficult to postpone urination? Not at All     How often have you had a weak urinary stream? Not at All     How often have you had to strain to start urination? Not at All     How many times did you typically get up at night to urinate? None     Total IPSS Score 0       Quality of Life due to urinary symptoms   If you were to spend the rest of your life with your urinary condition just the way it is now how would you feel about  that? Pleased        Score:  1-7 Mild 8-19 Moderate 20-35 Severe  Erectile dysfunction His SHIM score is 20, which is mild ED.   His previous SHIM score was 22.  He has been having difficulty with erections for several years.   His previous score was 13.  His major complaint is maintaining.  His libido is good.   His risk factors for ED are smoking, DM, BPH, alcohol abuse and HTN.  He denies any painful erections or curvatures with his erections.   He has tried Cialis and Viagra in the past.  Cialis is the most effective.      SHIM    Row Name 09/27/16 0824         SHIM: Over the last 6 months:   How do you rate your confidence that you could get and keep an erection? Moderate     When you had erections with sexual stimulation, how often were your erections hard enough for penetration (entering your partner)? Most Times (much more than half the time)  During sexual intercourse, how often were you able to maintain your erection after you had penetrated (entered) your partner? Most Times (much more than half the time)     During sexual intercourse, how difficult was it to maintain your erection to completion of intercourse? Not Difficult     When you attempted sexual intercourse, how often was it satisfactory for you? Most Times (much more than half the time)       SHIM Total Score   SHIM 20        Score: 1-7 Severe ED 8-11 Moderate ED 12-16 Mild-Moderate ED 17-21 Mild ED 22-25 No ED   PMH: Past Medical History:  Diagnosis Date  . Diabetes mellitus without complication ([place])   . Hypertension     Surgical History: Past Surgical History:  Procedure Laterality Date  . NO PAST SURGERIES      Home Medications:  Allergies as of 09/27/2016   No Known Allergies     Medication List       Accurate as of 09/27/16  8:36 AM. Always use your most recent med list.          acetaminophen 500 MG tablet Commonly known as:  TYLENOL Take 2 tablets (1,000 mg total) by  mouth every 8 (eight) hours as needed.   aspirin 81 MG tablet Take 1 tablet (81 mg total) by mouth daily.   atorvastatin 40 MG tablet Commonly known as:  LIPITOR Take 1 tablet (40 mg total) by mouth daily.   glucose blood test strip One Touch Zero Flex glucose test strips, use as directed   losartan-hydrochlorothiazide 100-25 MG tablet Commonly known as:  HYZAAR Take 1 tablet by mouth daily.   metFORMIN 500 MG tablet Commonly known as:  GLUCOPHAGE Take 1 tablet (500 mg total) by mouth 2 (two) times daily.   ONE TOUCH LANCETS Misc 1 Units by Does not apply route as directed.   tadalafil 20 MG tablet Commonly known as:  CIALIS Take 1 tablet (20 mg total) by mouth daily as needed for erectile dysfunction.   vitamin B-12 1000 MCG tablet Commonly known as:  CYANOCOBALAMIN Take 1 tablet (1,000 mcg total) by mouth daily.   Vitamin D3 2000 units Tabs Take 1 capsule by mouth.       Allergies: No Known Allergies  Family History: Family History  Problem Relation Age of Onset  . Diabetes Mother   . Cancer Father        bone  . Heart disease Brother   . Prostate cancer Brother   . Kidney cancer Neg Hx   . Kidney disease Neg Hx   . Bladder Cancer Neg Hx     Social History:  reports that he has been smoking Cigarettes.  He started smoking about 12 years ago. He has a 5.00 pack-year smoking history. He has never used smokeless tobacco. He reports that he drinks about 15.0 oz of alcohol per week . He reports that he does not use drugs.  ROS: UROLOGY Frequent Urination?: No Hard to postpone urination?: No Burning/pain with urination?: No Get up at night to urinate?: No Leakage of urine?: No Urine stream starts and stops?: No Trouble starting stream?: No Do you have to strain to urinate?: No Blood in urine?: No Urinary tract infection?: No Sexually transmitted disease?: No Injury to kidneys or bladder?: No Painful intercourse?: No Weak stream?: No Erection  problems?: No Penile pain?: No  Gastrointestinal Nausea?: No Vomiting?: No Indigestion/heartburn?: No Diarrhea?: No Constipation?: No  Constitutional Fever: No Night sweats?: No Weight loss?: No Fatigue?: No  Skin Skin rash/lesions?: No Itching?: No  Eyes Blurred vision?: No Double vision?: No  Ears/Nose/Throat Sore throat?: No Sinus problems?: No  Hematologic/Lymphatic Swollen glands?: No Easy bruising?: No  Cardiovascular Leg swelling?: No Chest pain?: No  Respiratory Cough?: No Shortness of breath?: No  Endocrine Excessive thirst?: No  Musculoskeletal Back pain?: No Joint pain?: No  Neurological Headaches?: No Dizziness?: No  Psychologic Depression?: No Anxiety?: No  Physical Exam: BP 137/74   Pulse 65   Ht 5\' 8"  (1.727 m)   Wt 167 lb (75.8 kg)   BMI 25.39 kg/m   Constitutional: Well nourished. Alert and oriented, No acute distress. HEENT: [place] AT, moist mucus membranes. Trachea midline, no masses. Cardiovascular: No clubbing, cyanosis, or edema. Respiratory: Normal respiratory effort, no increased work of breathing. GI: Abdomen is soft, non tender, non distended, no abdominal masses. Liver and spleen not palpable.  No hernias appreciated.  Stool sample for occult testing is not indicated.   GU: No CVA tenderness.  No bladder fullness or masses.  Patient with circumcised phallus.  Urethral meatus is patent.  No penile discharge. No penile lesions or rashes. Scrotum without lesions, cysts, rashes and/or edema.  Testicles are located scrotally bilaterally. No masses are appreciated in the testicles. Left and right epididymis are normal. Rectal: Patient with  normal sphincter tone. Anus and perineum without scarring or rashes. No rectal masses are appreciated. Prostate is approximately 60 grams, no nodules are appreciated. Seminal vesicles are normal. Skin: No rashes, bruises or suspicious lesions. Lymph: No cervical or inguinal  adenopathy. Neurologic: Grossly intact, no focal deficits, moving all 4 extremities. Psychiatric: Normal mood and affect.   Laboratory Data: Results for orders placed or performed during the hospital encounter of 09/20/16  PSA  Result Value Ref Range   Prostatic Specific Antigen 2.39 0.00 - 4.00 ng/mL    Lab Results  Component Value Date   CREATININE 1.11 05/20/2016    PSA History  1.5 ng/mL on 07/16/2013  1.7 ng/mL on 09/21/2014  2.2 ng/mL on 09/21/2015  2.39 ng/mL on 09/20/2016  Lab Results  Component Value Date   HGBA1C 6.7 (H) 09/16/2016   I have reviewed the labs  Assessment & Plan:    1. BPH with LUTS  - IPSS score is 0/1,  it is improving  - Continue conservative management, avoiding bladder irritants and timed voiding's  - RTC in 12 months for IPSS, PSA and exam   - BLADDER SCAN AMB NON-IMAGING   2. Erectile dysfunction  - SHIM score is 20  - Continue Cialis, meds refilled  - RTC in 12 months for repeat SHIM score and exam, as testosterone therapy can affect erectile function  3. Family history of PCa:   Patient's brother with a h/o PCa.     Return in about 1 year (around 09/27/2017) for IPSS, SHIM, PSA and exam.  Zara Council, [facility]  [place] [address], [place], [place] [address] [phone]

## 2016-09-27 ENCOUNTER — Ambulatory Visit: Payer: Managed Care, Other (non HMO) | Admitting: Urology

## 2016-09-27 ENCOUNTER — Encounter: Payer: Self-pay | Admitting: Urology

## 2016-09-27 ENCOUNTER — Ambulatory Visit (INDEPENDENT_AMBULATORY_CARE_PROVIDER_SITE_OTHER): Payer: 59 | Admitting: Urology

## 2016-09-27 VITALS — BP 137/74 | HR 65 | Ht 68.0 in | Wt 167.0 lb

## 2016-09-27 DIAGNOSIS — N529 Male erectile dysfunction, unspecified: Secondary | ICD-10-CM

## 2016-09-27 DIAGNOSIS — N138 Other obstructive and reflux uropathy: Secondary | ICD-10-CM

## 2016-09-27 DIAGNOSIS — Z8042 Family history of malignant neoplasm of prostate: Secondary | ICD-10-CM | POA: Diagnosis not present

## 2016-09-27 DIAGNOSIS — N401 Enlarged prostate with lower urinary tract symptoms: Secondary | ICD-10-CM | POA: Diagnosis not present

## 2016-11-08 ENCOUNTER — Other Ambulatory Visit: Payer: Self-pay

## 2016-11-08 MED ORDER — ATORVASTATIN CALCIUM 40 MG PO TABS: 40.0000 mg | ORAL_TABLET | Freq: Every day | ORAL | 3 refills | Status: DC

## 2016-11-11 ENCOUNTER — Other Ambulatory Visit: Payer: Self-pay | Admitting: Family Medicine

## 2016-11-11 MED ORDER — METFORMIN HCL 500 MG PO TABS: 500.0000 mg | ORAL_TABLET | Freq: Two times a day (BID) | ORAL | 3 refills | Status: DC

## 2016-11-11 MED ORDER — ATORVASTATIN CALCIUM 40 MG PO TABS: 40.0000 mg | ORAL_TABLET | Freq: Every day | ORAL | 3 refills | Status: DC

## 2016-12-17 ENCOUNTER — Ambulatory Visit: Payer: 59 | Admitting: Family Medicine

## 2017-03-19 ENCOUNTER — Ambulatory Visit: Payer: 59 | Admitting: Family Medicine

## 2017-03-19 ENCOUNTER — Encounter: Payer: Self-pay | Admitting: Family Medicine

## 2017-03-19 VITALS — BP 138/76 | HR 68 | Resp 16 | Ht 68.0 in | Wt 165.2 lb

## 2017-03-19 DIAGNOSIS — E119 Type 2 diabetes mellitus without complications: Secondary | ICD-10-CM | POA: Diagnosis not present

## 2017-03-19 DIAGNOSIS — R413 Other amnesia: Secondary | ICD-10-CM

## 2017-03-19 DIAGNOSIS — F1010 Alcohol abuse, uncomplicated: Secondary | ICD-10-CM

## 2017-03-19 DIAGNOSIS — E559 Vitamin D deficiency, unspecified: Secondary | ICD-10-CM

## 2017-03-19 DIAGNOSIS — E782 Mixed hyperlipidemia: Secondary | ICD-10-CM

## 2017-03-19 DIAGNOSIS — F329 Major depressive disorder, single episode, unspecified: Secondary | ICD-10-CM | POA: Diagnosis not present

## 2017-03-19 DIAGNOSIS — I10 Essential (primary) hypertension: Secondary | ICD-10-CM

## 2017-03-19 DIAGNOSIS — E538 Deficiency of other specified B group vitamins: Secondary | ICD-10-CM | POA: Diagnosis not present

## 2017-03-19 DIAGNOSIS — F17200 Nicotine dependence, unspecified, uncomplicated: Secondary | ICD-10-CM | POA: Diagnosis not present

## 2017-03-19 DIAGNOSIS — G3184 Mild cognitive impairment, so stated: Secondary | ICD-10-CM | POA: Insufficient documentation

## 2017-03-19 DIAGNOSIS — F324 Major depressive disorder, single episode, in partial remission: Secondary | ICD-10-CM | POA: Insufficient documentation

## 2017-03-19 DIAGNOSIS — F331 Major depressive disorder, recurrent, moderate: Secondary | ICD-10-CM | POA: Insufficient documentation

## 2017-03-19 DIAGNOSIS — F32A Depression, unspecified: Secondary | ICD-10-CM

## 2017-03-19 MED ORDER — CITALOPRAM HYDROBROMIDE 20 MG PO TABS: 20.0000 mg | ORAL_TABLET | Freq: Every day | ORAL | 2 refills | Status: DC

## 2017-03-19 NOTE — Progress Notes (Signed)
Date:  03/19/2017   Name:  Calvin Sanders   DOB:  06-30-1955   MRN:  [id number]  PCP:  Adline Potter, MD    Chief Complaint: Hypertension (recall )   History of Present Illness:  This is a 62 y.o. male seen for five month f/u. Reports progressive memory loss over past 1-2 years, no FH dementia, affecting work. Still smoking and drinking 4 drinks/night. T2DM on metformin, last a1c 6.7% in August. BPH on Cialis per urology, last seen in August. HLD on Lipitor, LDL 78 in April. On B12 and vit D supps. Walks all day on Architect job.  Review of Systems:  Review of Systems  Constitutional: Negative for chills and fever.  Respiratory: Negative for cough and shortness of breath.   Cardiovascular: Negative for chest pain and leg swelling.  Gastrointestinal: Negative for abdominal pain.  Genitourinary: Negative for difficulty urinating.  Neurological: Negative for syncope and light-headedness.    Patient Active Problem List   Diagnosis Date Noted  . MCI (mild cognitive impairment) 03/19/2017  . Depression 03/19/2017  . B12 deficiency 05/22/2016  . Vitamin D deficiency 02/08/2015  . Overweight (BMI 25.0-29.9) 02/07/2015  . 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 Family history of prostate cancer 09/06/2014  . Diabetes mellitus type 2, controlled, without complications ([place]) 33/29/5188  . Hyperlipemia, mixed 08/05/2014  . Smoker 08/05/2014  . Excessive drinking of alcohol 08/05/2014  . Colon polyps 05/14/2014  . Chronic prostatitis 05/14/2014  . Hypertension 05/14/2014  . BPH (benign prostatic hyperplasia) 05/14/2014    Prior to Admission medications   Medication Sig Start Date End Date Taking? Authorizing Provider  acetaminophen (TYLENOL) 500 MG tablet Take 2 tablets (1,000 mg total) by mouth every 8 (eight) hours as needed. 05/20/16  Yes Rowland Ericsson, Gwyndolyn Saxon, MD  aspirin 81 MG tablet Take 1 tablet (81 mg total) by mouth daily. 02/08/15  Yes Roy Tokarz, Gwyndolyn Saxon, MD   atorvastatin (LIPITOR) 40 MG tablet Take 1 tablet (40 mg total) by mouth daily. 11/11/16  Yes Huntleigh Doolen, Gwyndolyn Saxon, MD  Cholecalciferol (VITAMIN D3) 2000 units TABS Take 1 capsule by mouth.   Yes [provider]  glucose blood test strip One Touch Zero Flex glucose test strips, use as directed 11/10/14  Yes Jo Booze, Gwyndolyn Saxon, MD  losartan-hydrochlorothiazide (HYZAAR) 100-25 MG tablet Take 1 tablet by mouth daily. 05/22/16  Yes Nader Boys, Gwyndolyn Saxon, MD  metFORMIN (GLUCOPHAGE) 500 MG tablet Take 1 tablet (500 mg total) by mouth 2 (two) times daily. 11/11/16  Yes Taje Littler, Gwyndolyn Saxon, MD  ONE TOUCH LANCETS MISC 1 Units by Does not apply route as directed. 09/22/14  Yes Liahm Grivas, Gwyndolyn Saxon, MD  tadalafil (CIALIS) 20 MG tablet Take 1 tablet (20 mg total) by mouth daily as needed for erectile dysfunction. 09/28/15  Yes McGowan, Larene Beach A, PA-C  vitamin B-12 (CYANOCOBALAMIN) 1000 MCG tablet Take 1 tablet (1,000 mcg total) by mouth daily. 05/22/16  Yes Karesa Maultsby, Gwyndolyn Saxon, MD  citalopram (CELEXA) 20 MG tablet Take 1 tablet (20 mg total) by mouth daily. 03/19/17   Adline Potter, MD    No Known Allergies  Past Surgical History:  Procedure Laterality Date  . NO PAST SURGERIES      Social History   Tobacco Use  . Smoking status: Current Every Day Smoker    Packs/day: 0.50    Years: 10.00    Pack years: 5.00    Types: Cigarettes    Start date: 02/12/2004  . Smokeless tobacco: Never Used  Substance Use Topics  . Alcohol use: Yes  Alcohol/week: 15.0 oz    Types: 25 Cans of beer per week    Comment: 4-6 beers every evening  . Drug use: No    Family History  Problem Relation Age of Onset  . Diabetes Mother   . Cancer Father        bone  . Heart disease Brother   . Prostate cancer Brother   . Kidney cancer Neg Hx   . Kidney disease Neg Hx   . Bladder Cancer Neg Hx     Medication list has been reviewed and updated.  Physical Examination: BP 138/76   Pulse 68   Resp 16   Ht 5\' 8"  (1.727 m)   Wt 165 lb 3.2 oz  (74.9 kg)   SpO2 98%   BMI 25.12 kg/m   Physical Exam  Constitutional: He appears well-developed and well-nourished.  Cardiovascular: Normal rate, regular rhythm and normal heart sounds.  Pulmonary/Chest: Effort normal and breath sounds normal.  Musculoskeletal: He exhibits no edema.  Neurological: He is alert.  SLUMS 23/30 with HS education, missed day/year and 3/5 object recall  Skin: Skin is warm and dry.  Psychiatric: His behavior is normal.  Flat affect GDS 5/15  Nursing note and vitals reviewed.   Assessment and Plan:  1. Controlled type 2 diabetes mellitus without complication, without long-term current use of insulin ([place]) On metformin alone, MCR ok in August, needs optho exam - HgB A1c  2. Memory loss MCI per cognitive testing, may be Alz type, vascular given risk factors, or related to excessive alcohol use - TSH - RPR - MR Brain Wo Contrast; Future  3. Depression, unspecified depression type Positive GDS, begin Celexa 20 mg daily, reassess next visit  4. Essential hypertension Well controlled on Hyzaar - Comprehensive Metabolic Panel (CMET) - CBC  5. Hyperlipemia, mixed Well controlled on Lipitor  6. Vitamin D deficiency Well controlled on supplement  7. B12 deficiency On supplement - B12  8. Excessive drinking of alcohol Advised cut back to 2 drinks daily  9. Smoker Strongly advised cessation - Nurse to provide smoking / tobacco cessation education  10. Med review Consider d/c asa if MRI ok  Return in about 4 weeks (around 04/16/2017).  Satira Anis. [facility]  03/19/2017

## 2017-03-20 ENCOUNTER — Other Ambulatory Visit: Payer: Self-pay | Admitting: Family Medicine

## 2017-03-20 LAB — CBC
HEMATOCRIT: 44.5 % (ref 37.5–51.0)
HEMOGLOBIN: 15.9 g/dL (ref 13.0–17.7)
MCH: 35.2 pg — ABNORMAL HIGH (ref 26.6–33.0)
MCHC: 35.7 g/dL (ref 31.5–35.7)
MCV: 99 fL — ABNORMAL HIGH (ref 79–97)
Platelets: 222 10*3/uL (ref 150–379)
RBC: 4.52 x10E6/uL (ref 4.14–5.80)
RDW: 12.9 % (ref 12.3–15.4)
WBC: 5.9 10*3/uL (ref 3.4–10.8)

## 2017-03-20 LAB — COMPREHENSIVE METABOLIC PANEL
A/G RATIO: 2 (ref 1.2–2.2)
ALT: 18 IU/L (ref 0–44)
AST: 23 IU/L (ref 0–40)
Albumin: 4.7 g/dL (ref 3.6–4.8)
Alkaline Phosphatase: 75 IU/L (ref 39–117)
BUN/Creatinine Ratio: 7 — ABNORMAL LOW (ref 10–24)
BUN: 6 mg/dL — AB (ref 8–27)
Bilirubin Total: 0.6 mg/dL (ref 0.0–1.2)
CALCIUM: 9.6 mg/dL (ref 8.6–10.2)
CHLORIDE: 86 mmol/L — AB (ref 96–106)
CO2: 23 mmol/L (ref 20–29)
Creatinine, Ser: 0.86 mg/dL (ref 0.76–1.27)
GFR calc Af Amer: 107 mL/min/{1.73_m2} (ref >59)
GFR calc non Af Amer: 92 mL/min/{1.73_m2} (ref >59)
GLUCOSE: 127 mg/dL — AB (ref 65–99)
Globulin, Total: 2.4 g/dL (ref 1.5–4.5)
Potassium: 4.3 mmol/L (ref 3.5–5.2)
Sodium: 126 mmol/L — ABNORMAL LOW (ref 134–144)
TOTAL PROTEIN: 7.1 g/dL (ref 6.0–8.5)

## 2017-03-20 LAB — TSH: TSH: 1.16 u[IU]/mL (ref 0.450–4.500)

## 2017-03-20 LAB — HEMOGLOBIN A1C
ESTIMATED AVERAGE GLUCOSE: 154 mg/dL
HEMOGLOBIN A1C: 7 % — AB (ref 4.8–5.6)

## 2017-03-20 LAB — VITAMIN B12: VITAMIN B 12: 1589 pg/mL — AB (ref 232–1245)

## 2017-03-20 LAB — RPR: RPR: NONREACTIVE

## 2017-03-20 MED ORDER — B-12 500 MCG PO TABS: 1.0000 | ORAL_TABLET | Freq: Every day | ORAL | Status: AC

## 2017-03-20 NOTE — Progress Notes (Signed)
b12

## 2017-03-31 ENCOUNTER — Telehealth: Payer: Self-pay

## 2017-03-31 NOTE — Telephone Encounter (Signed)
Left message to call ASAP regarding Imaging ordered in two places.

## 2017-04-01 ENCOUNTER — Ambulatory Visit: Payer: 59

## 2017-04-10 ENCOUNTER — Ambulatory Visit
Admission: RE | Admit: 2017-04-10 | Discharge: 2017-04-10 | Disposition: A | Discharge status: Home / Self Care | Payer: 59 | Source: Ambulatory Visit | Attending: Family Medicine | Admitting: Family Medicine

## 2017-04-10 ENCOUNTER — Other Ambulatory Visit: Payer: Self-pay

## 2017-04-10 DIAGNOSIS — T1590XA Foreign body on external eye, part unspecified, unspecified eye, initial encounter: Secondary | ICD-10-CM

## 2017-04-10 DIAGNOSIS — R413 Other amnesia: Secondary | ICD-10-CM

## 2017-04-14 ENCOUNTER — Other Ambulatory Visit: Payer: Self-pay | Admitting: Family Medicine

## 2017-04-21 ENCOUNTER — Ambulatory Visit: Payer: 59 | Admitting: Family Medicine

## 2017-04-21 ENCOUNTER — Encounter: Payer: Self-pay | Admitting: Family Medicine

## 2017-04-21 VITALS — BP 122/82 | HR 64 | Resp 16 | Ht 68.0 in | Wt 166.0 lb

## 2017-04-21 DIAGNOSIS — E782 Mixed hyperlipidemia: Secondary | ICD-10-CM

## 2017-04-21 DIAGNOSIS — E119 Type 2 diabetes mellitus without complications: Secondary | ICD-10-CM

## 2017-04-21 DIAGNOSIS — E538 Deficiency of other specified B group vitamins: Secondary | ICD-10-CM | POA: Diagnosis not present

## 2017-04-21 DIAGNOSIS — F17200 Nicotine dependence, unspecified, uncomplicated: Secondary | ICD-10-CM | POA: Diagnosis not present

## 2017-04-21 DIAGNOSIS — E559 Vitamin D deficiency, unspecified: Secondary | ICD-10-CM

## 2017-04-21 DIAGNOSIS — I10 Essential (primary) hypertension: Secondary | ICD-10-CM | POA: Diagnosis not present

## 2017-04-21 DIAGNOSIS — G3184 Mild cognitive impairment, so stated: Secondary | ICD-10-CM | POA: Diagnosis not present

## 2017-04-21 DIAGNOSIS — F1010 Alcohol abuse, uncomplicated: Secondary | ICD-10-CM | POA: Diagnosis not present

## 2017-04-21 DIAGNOSIS — F321 Major depressive disorder, single episode, moderate: Secondary | ICD-10-CM | POA: Diagnosis not present

## 2017-04-21 NOTE — Progress Notes (Signed)
Date:  04/21/2017   Name:  Calvin Sanders   DOB:  04/11/1955   MRN:  [id number]  PCP:  Adline Potter, MD    Chief Complaint: Diabetes   History of Present Illness:  This is a 63 y.o. male seen for one month f/u. Family feels more talkative and mood better on Celexa. Blood work ok except high B12, supplement dose reduced, asa also stopped. MRI brain showed minimal small vessel disease and volume loss essentially normal for age. Has cut back to 2 drinks alcohol daily, some days has none, but continues to smoke. Family notes he has lost his job due to memory issues and asks about disability.  Review of Systems:  Review of Systems  Constitutional: Negative for chills and fever.  Respiratory: Negative for cough and shortness of breath.   Cardiovascular: Negative for chest pain and leg swelling.  Genitourinary: Negative for difficulty urinating.  Neurological: Negative for syncope and light-headedness.    Patient Active Problem List   Diagnosis Date Noted  . MCI (mild cognitive impairment) 03/19/2017  . Depression 03/19/2017  . B12 deficiency 05/22/2016  . Vitamin D deficiency 02/08/2015  . Overweight (BMI 25.0-29.9) 02/07/2015  . 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 Family history of prostate cancer 09/06/2014  . Diabetes mellitus type 2, controlled, without complications ([place]) 42/59/5638  . Hyperlipemia, mixed 08/05/2014  . Smoker 08/05/2014  . Excessive drinking of alcohol 08/05/2014  . Colon polyps 05/14/2014  . Chronic prostatitis 05/14/2014  . Hypertension 05/14/2014  . BPH (benign prostatic hyperplasia) 05/14/2014    Prior to Admission medications   Medication Sig Start Date End Date Taking? Authorizing Provider  acetaminophen (TYLENOL) 500 MG tablet Take 2 tablets (1,000 mg total) by mouth every 8 (eight) hours as needed. 05/20/16  Yes Anael Rosch, Gwyndolyn Saxon, MD  atorvastatin (LIPITOR) 40 MG tablet Take 1 tablet (40 mg total) by mouth daily. 11/11/16  Yes Sharetta Ricchio,  Gwyndolyn Saxon, MD  Cholecalciferol (VITAMIN D3) 2000 units TABS Take 1 capsule by mouth.   Yes [provider]  citalopram (CELEXA) 20 MG tablet Take 1 tablet (20 mg total) by mouth daily. 03/19/17  Yes Khole Branch, Gwyndolyn Saxon, MD  Cyanocobalamin (B-12) 500 MCG TABS Take 1 tablet by mouth daily. 03/20/17  Yes Rikayla Demmon, Gwyndolyn Saxon, MD  glucose blood test strip One Touch Zero Flex glucose test strips, use as directed 11/10/14  Yes Spiro Ausborn, Gwyndolyn Saxon, MD  losartan-hydrochlorothiazide (HYZAAR) 100-25 MG tablet Take 1 tablet by mouth daily. 05/22/16  Yes Endiya Klahr, Gwyndolyn Saxon, MD  metFORMIN (GLUCOPHAGE) 500 MG tablet Take 1 tablet (500 mg total) by mouth 2 (two) times daily. 11/11/16  Yes Marigold Mom, Gwyndolyn Saxon, MD  ONE TOUCH LANCETS MISC 1 Units by Does not apply route as directed. 09/22/14  Yes Kaydon Husby, Gwyndolyn Saxon, MD  tadalafil (CIALIS) 20 MG tablet Take 1 tablet (20 mg total) by mouth daily as needed for erectile dysfunction. 09/28/15  Yes McGowan, Larene Beach A, PA-C    No Known Allergies  Past Surgical History:  Procedure Laterality Date  . NO PAST SURGERIES      Social History   Tobacco Use  . Smoking status: Current Every Day Smoker    Packs/day: 0.50    Years: 10.00    Pack years: 5.00    Types: Cigarettes    Start date: 02/12/2004  . Smokeless tobacco: Never Used  Substance Use Topics  . Alcohol use: Yes    Alcohol/week: 15.0 oz    Types: 25 Cans of beer per week    Comment: 4-6  beers every evening  . Drug use: No    Family History  Problem Relation Age of Onset  . Diabetes Mother   . Cancer Father        bone  . Heart disease Brother   . Prostate cancer Brother   . Kidney cancer Neg Hx   . Kidney disease Neg Hx   . Bladder Cancer Neg Hx     Medication list has been reviewed and updated.  Physical Examination: BP 122/82   Pulse 64   Resp 16   Ht 5\' 8"  (1.727 m)   Wt 166 lb (75.3 kg)   SpO2 98%   BMI 25.24 kg/m   Physical Exam  Constitutional: He appears well-developed and well-nourished.   Cardiovascular: Normal rate, regular rhythm and normal heart sounds.  Pulmonary/Chest: Effort normal and breath sounds normal.  Musculoskeletal: He exhibits no edema.  Neurological: He is alert.  Skin: Skin is warm and dry.  Psychiatric: His behavior is normal.  Flat affect but brighter than last visit  Nursing note and vitals reviewed.   Assessment and Plan:  1. Controlled type 2 diabetes mellitus without complication, without long-term current use of insulin ([facility]) Well controlled on metformin alone, consider a1c next visit  2. Current moderate episode of major depressive disorder without prior episode ([facility]) Improved on Celexa, continue current dose  3. MCI (mild cognitive impairment) Likely related to alcohol use with vascular component given risk factors, consider repeat cognitive testing next visit, discussed application process for SS disability given job loss.  4. Essential hypertension Well controlled on Hyzaar  5. Hyperlipemia, mixed Well controlled on Lipitor  6. Smoker Advised cessation  7. Excessive drinking of alcohol Continue to reduce use, discuss cessation next visit  8. Vitamin D deficiency Well controlled on supplement  9. B12 deficiency On decreased supplement, consider recheck level next visit  Return in about 2 months (around 06/21/2017).  Satira Anis. [facility]  04/21/2017

## 2017-04-21 NOTE — Patient Instructions (Signed)
Quit smoking and limit alcohol use.

## 2017-06-10 ENCOUNTER — Other Ambulatory Visit: Payer: Self-pay

## 2017-06-10 MED ORDER — CITALOPRAM HYDROBROMIDE 20 MG PO TABS: 20.0000 mg | ORAL_TABLET | Freq: Every day | ORAL | 0 refills | Status: DC

## 2017-06-11 ENCOUNTER — Telehealth: Payer: Self-pay

## 2017-06-12 MED ORDER — LOSARTAN POTASSIUM-HCTZ 100-25 MG PO TABS: 1.0000 | ORAL_TABLET | Freq: Every day | ORAL | 0 refills | Status: DC

## 2017-06-12 NOTE — Telephone Encounter (Signed)
NO MORE REFILLS MUST HAVE PCP AS HE DECLINED [place] AS PCP

## 2017-06-23 ENCOUNTER — Ambulatory Visit: Payer: 59 | Admitting: Family Medicine

## 2017-06-27 ENCOUNTER — Ambulatory Visit: Payer: 59 | Admitting: Internal Medicine

## 2017-07-12 ENCOUNTER — Other Ambulatory Visit: Payer: Self-pay | Admitting: Internal Medicine

## 2017-07-23 ENCOUNTER — Encounter: Payer: Self-pay | Admitting: Internal Medicine

## 2017-07-23 ENCOUNTER — Ambulatory Visit (INDEPENDENT_AMBULATORY_CARE_PROVIDER_SITE_OTHER): Payer: Self-pay | Admitting: Internal Medicine

## 2017-07-23 VITALS — BP 170/72 | HR 65 | Temp 98.0°F | Resp 16 | Ht 68.0 in | Wt 160.0 lb

## 2017-07-23 DIAGNOSIS — M7989 Other specified soft tissue disorders: Secondary | ICD-10-CM

## 2017-07-23 NOTE — Progress Notes (Signed)
Date:  07/23/2017   Name:  Calvin Sanders   DOB:  [birth year]   MRN:  [id number]   Chief Complaint: Hand Problem (left thumb had screw in it 2 years ago did not seek medical attention ) Hand Injury   Incident onset: 2 years ago. Pertinent negatives include no chest pain.  Screwed a screw through the side of his distal left thumb 2 years ago.  It healed with problem or follow up.  Now he is moving house and has been doing much more lifting and carrying on a daily basis.  The area is now swollen but not tender or red.  No drainage.  His wife wanted it to be check out.   Review of Systems  Constitutional: Negative for chills, fatigue and fever.  Respiratory: Negative for chest tightness, shortness of breath and wheezing.   Cardiovascular: Negative for chest pain and palpitations.  Musculoskeletal: Positive for joint swelling. Negative for arthralgias and gait problem.    Patient Active Problem List   Diagnosis Date Noted  . MCI (mild cognitive impairment) 03/19/2017  . Depression 03/19/2017  . B12 deficiency 05/22/2016  . Vitamin D deficiency 02/08/2015  . Overweight (BMI 25.0-29.9) 02/07/2015  . 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 Family history of prostate cancer 09/06/2014  . Diabetes mellitus type 2, controlled, without complications ([place]) 76/73/4193  . Hyperlipemia, mixed 08/05/2014  . Smoker 08/05/2014  . Excessive drinking of alcohol 08/05/2014  . Colon polyps 05/14/2014  . Chronic prostatitis 05/14/2014  . Hypertension 05/14/2014  . BPH (benign prostatic hyperplasia) 05/14/2014    Prior to Admission medications   Medication Sig Start Date End Date Taking? Authorizing Provider  acetaminophen (TYLENOL) 500 MG tablet Take 2 tablets (1,000 mg total) by mouth every 8 (eight) hours as needed. 05/20/16  Yes Plonk, Gwyndolyn Saxon, MD  atorvastatin (LIPITOR) 40 MG tablet Take 1 tablet (40 mg total) by mouth daily. 11/11/16  Yes Plonk, Gwyndolyn Saxon, MD  Cholecalciferol  (VITAMIN D3) 2000 units TABS Take 1 capsule by mouth.   Yes [provider]  citalopram (CELEXA) 20 MG tablet Take 1 tablet (20 mg total) by mouth daily. 06/10/17  Yes Plonk, Gwyndolyn Saxon, MD  Cyanocobalamin (B-12) 500 MCG TABS Take 1 tablet by mouth daily. 03/20/17  Yes Plonk, Gwyndolyn Saxon, MD  glucose blood test strip One Touch Zero Flex glucose test strips, use as directed 11/10/14  Yes Plonk, Gwyndolyn Saxon, MD  losartan-hydrochlorothiazide (HYZAAR) 100-25 MG tablet TAKE 1 TABLET BY MOUTH DAILY 07/12/17  Yes Glean Hess, MD  metFORMIN (GLUCOPHAGE) 500 MG tablet Take 1 tablet (500 mg total) by mouth 2 (two) times daily. 11/11/16  Yes Plonk, Gwyndolyn Saxon, MD  ONE TOUCH LANCETS MISC 1 Units by Does not apply route as directed. 09/22/14  Yes Plonk, Gwyndolyn Saxon, MD  tadalafil (CIALIS) 20 MG tablet Take 1 tablet (20 mg total) by mouth daily as needed for erectile dysfunction. 09/28/15  Yes McGowan, Larene Beach A, PA-C    No Known Allergies  Past Surgical History:  Procedure Laterality Date  . NO PAST SURGERIES      Social History   Tobacco Use  . Smoking status: Current Every Day Smoker    Packs/day: 0.50    Years: 10.00    Pack years: 5.00    Types: Cigarettes    Start date: 02/12/2004  . Smokeless tobacco: Never Used  Substance Use Topics  . Alcohol use: Yes    Alcohol/week: 15.0 oz    Types: 25 Cans of beer per  week    Comment: 4-6 beers every evening  . Drug use: No     Medication list has been reviewed and updated.  Current Meds  Medication Sig  . acetaminophen (TYLENOL) 500 MG tablet Take 2 tablets (1,000 mg total) by mouth every 8 (eight) hours as needed.  [name] atorvastatin (LIPITOR) 40 MG tablet Take 1 tablet (40 mg total) by mouth daily.  . Cholecalciferol (VITAMIN D3) 2000 units TABS Take 1 capsule by mouth.  . citalopram (CELEXA) 20 MG tablet Take 1 tablet (20 mg total) by mouth daily.  . Cyanocobalamin (B-12) 500 MCG TABS Take 1 tablet by mouth daily.  [name] glucose blood test strip One Touch  Zero Flex glucose test strips, use as directed  . losartan-hydrochlorothiazide (HYZAAR) 100-25 MG tablet TAKE 1 TABLET BY MOUTH DAILY  . metFORMIN (GLUCOPHAGE) 500 MG tablet Take 1 tablet (500 mg total) by mouth 2 (two) times daily.  . ONE TOUCH LANCETS MISC 1 Units by Does not apply route as directed.  . tadalafil (CIALIS) 20 MG tablet Take 1 tablet (20 mg total) by mouth daily as needed for erectile dysfunction.    PHQ 2/9 Scores 07/23/2017 04/21/2017 09/16/2016 09/05/2014  PHQ - 2 Score 0 0 0 0  PHQ- 9 Score 0 0 - -    Physical Exam  Constitutional: He is oriented to person, place, and time. He appears well-developed. No distress.  HENT:  Head: Normocephalic and atraumatic.  Pulmonary/Chest: Effort normal. No respiratory distress.  Musculoskeletal: Normal range of motion.       Arms: Neurological: He is alert and oriented to person, place, and time.  Skin: Skin is warm and dry. No rash noted.  Psychiatric: He has a normal mood and affect. His behavior is normal. Thought content normal.  Nursing note and vitals reviewed.   BP (!) 170/72   Pulse 65   Temp 98 F (36.7 C) (Oral)   Resp 16   Ht 5\' 8"  (1.727 m)   Wt 160 lb (72.6 kg)   SpO2 99%   BMI 24.33 kg/m   Assessment and Plan: 1. Swelling of thumb, left Due to recent repeated trauma Monitor for worsening or change in sx Should resolve once moving is complete.   No orders of the defined types were placed in this encounter.   Partially dictated using Editor, commissioning. Any errors are unintentional.  Halina Maidens, MD [facility]  07/23/2017

## 2017-08-01 ENCOUNTER — Telehealth: Payer: Self-pay

## 2017-08-01 ENCOUNTER — Other Ambulatory Visit: Payer: Self-pay

## 2017-08-01 MED ORDER — CITALOPRAM HYDROBROMIDE 20 MG PO TABS: 20.0000 mg | ORAL_TABLET | Freq: Every day | ORAL | 2 refills | Status: DC

## 2017-08-01 NOTE — Telephone Encounter (Signed)
Wife left VM that she needs refills for husband but did not say what meds. I have LM to call me back.

## 2017-08-12 ENCOUNTER — Other Ambulatory Visit: Payer: Self-pay | Admitting: Internal Medicine

## 2017-09-15 ENCOUNTER — Telehealth: Payer: Self-pay | Admitting: Urology

## 2017-09-15 ENCOUNTER — Other Ambulatory Visit: Payer: Self-pay

## 2017-09-15 DIAGNOSIS — N401 Enlarged prostate with lower urinary tract symptoms: Principal | ICD-10-CM

## 2017-09-15 DIAGNOSIS — N138 Other obstructive and reflux uropathy: Secondary | ICD-10-CM

## 2017-09-15 NOTE — Telephone Encounter (Signed)
Pt has upcoming appt and wants to get his labs before his appt, No orders are in for him to go to [place]. Please enter orders, pt states he will go to [place] 1 week to 2 days before his appt to get labs drawn. Thanks.

## 2017-09-15 NOTE — Telephone Encounter (Signed)
Orders entered

## 2017-09-30 ENCOUNTER — Ambulatory Visit: Payer: Self-pay | Admitting: Internal Medicine

## 2017-10-03 ENCOUNTER — Other Ambulatory Visit: Payer: Self-pay

## 2017-10-03 ENCOUNTER — Ambulatory Visit: Payer: 59 | Admitting: Urology

## 2017-10-03 MED ORDER — METFORMIN HCL 500 MG PO TABS: 500.0000 mg | ORAL_TABLET | Freq: Two times a day (BID) | ORAL | 1 refills | Status: DC

## 2017-10-10 ENCOUNTER — Other Ambulatory Visit: Payer: Self-pay | Admitting: Internal Medicine

## 2017-10-17 ENCOUNTER — Ambulatory Visit: Payer: Self-pay | Admitting: Urology

## 2017-10-28 ENCOUNTER — Other Ambulatory Visit: Payer: Self-pay | Admitting: Internal Medicine

## 2017-11-04 ENCOUNTER — Encounter: Payer: Self-pay | Admitting: Internal Medicine

## 2017-11-04 ENCOUNTER — Ambulatory Visit (INDEPENDENT_AMBULATORY_CARE_PROVIDER_SITE_OTHER): Payer: Self-pay | Admitting: Internal Medicine

## 2017-11-04 VITALS — BP 132/66 | HR 65 | Ht 68.0 in | Wt 171.0 lb

## 2017-11-04 DIAGNOSIS — I10 Essential (primary) hypertension: Secondary | ICD-10-CM

## 2017-11-04 DIAGNOSIS — E119 Type 2 diabetes mellitus without complications: Secondary | ICD-10-CM

## 2017-11-04 DIAGNOSIS — N401 Enlarged prostate with lower urinary tract symptoms: Secondary | ICD-10-CM

## 2017-11-04 DIAGNOSIS — F324 Major depressive disorder, single episode, in partial remission: Secondary | ICD-10-CM

## 2017-11-04 DIAGNOSIS — E1169 Type 2 diabetes mellitus with other specified complication: Secondary | ICD-10-CM

## 2017-11-04 DIAGNOSIS — E785 Hyperlipidemia, unspecified: Secondary | ICD-10-CM

## 2017-11-04 DIAGNOSIS — R351 Nocturia: Secondary | ICD-10-CM

## 2017-11-04 MED ORDER — ATORVASTATIN CALCIUM 40 MG PO TABS: 40.0000 mg | ORAL_TABLET | Freq: Every day | ORAL | 3 refills | Status: DC

## 2017-11-04 MED ORDER — LOSARTAN POTASSIUM-HCTZ 100-25 MG PO TABS: 1.0000 | ORAL_TABLET | Freq: Every day | ORAL | 0 refills | Status: DC

## 2017-11-04 MED ORDER — CITALOPRAM HYDROBROMIDE 20 MG PO TABS: 20.0000 mg | ORAL_TABLET | Freq: Every day | ORAL | 5 refills | Status: DC

## 2017-11-04 NOTE — Progress Notes (Signed)
Date:  11/04/2017   Name:  Calvin Sanders   DOB:  05-09-1955   MRN:  [id number]   Chief Complaint: Depression; Diabetes; and Hypertension Pt declines influenza vaccine.  Depression         This is a chronic problem.  Associated symptoms include no fatigue, no appetite change and no headaches.  Past treatments include SSRIs - Selective serotonin reuptake inhibitors.  Compliance with treatment is good.  Previous treatment provided significant relief. Diabetes  He presents for his follow-up diabetic visit. He has type 2 diabetes mellitus. His disease course has been stable. Hypoglycemia symptoms include confusion (mild memory issues). Pertinent negatives for hypoglycemia include no headaches or tremors. Pertinent negatives for diabetes include no chest pain, no fatigue, no polydipsia and no polyuria. Current diabetic treatment includes oral agent (monotherapy). He is compliant with treatment most of the time. An ACE inhibitor/angiotensin II receptor blocker is being taken. Eye exam is current.  Hypertension  This is a chronic problem. The problem is controlled. Pertinent negatives include no chest pain, headaches, palpitations or shortness of breath. Past treatments include angiotensin blockers and diuretics.  Benign Prostatic Hypertrophy  This is a chronic problem. The problem is unchanged. Pertinent negatives include no dysuria or hematuria. Treatments tried: cialis.    Review of Systems  Constitutional: Negative for appetite change, fatigue and unexpected weight change.  Eyes: Negative for visual disturbance.  Respiratory: Negative for cough, shortness of breath and wheezing.   Cardiovascular: Negative for chest pain, palpitations and leg swelling.  Gastrointestinal: Negative for abdominal pain and blood in stool.  Endocrine: Negative for polydipsia and polyuria.  Genitourinary: Negative for dysuria and hematuria.  Skin: Negative for color change and rash.  Neurological: Negative for  tremors, numbness and headaches.  Psychiatric/Behavioral: Positive for confusion (mild memory issues) and depression. Negative for dysphoric mood and sleep disturbance.    Patient Active Problem List   Diagnosis Date Noted  . MCI (mild cognitive impairment) 03/19/2017  . Depression 03/19/2017  . B12 deficiency 05/22/2016  . Vitamin D deficiency 02/08/2015  . Overweight (BMI 25.0-29.9) 02/07/2015  . 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 Family history of prostate cancer 09/06/2014  . Diabetes mellitus type 2, controlled, without complications ([place]) 09/38/1829  . Hyperlipemia, mixed 08/05/2014  . Smoker 08/05/2014  . Excessive drinking of alcohol 08/05/2014  . Colon polyps 05/14/2014  . Chronic prostatitis 05/14/2014  . Hypertension 05/14/2014  . BPH (benign prostatic hyperplasia) 05/14/2014    No Known Allergies  Past Surgical History:  Procedure Laterality Date  . NO PAST SURGERIES      Social History   Tobacco Use  . Smoking status: Current Every Day Smoker    Packs/day: 0.50    Years: 10.00    Pack years: 5.00    Types: Cigarettes    Start date: 02/12/2004  . Smokeless tobacco: Never Used  Substance Use Topics  . Alcohol use: Yes    Alcohol/week: 25.0 standard drinks    Types: 25 Cans of beer per week    Comment: 4-6 beers every evening  . Drug use: No     Medication list has been reviewed and updated.  Current Meds  Medication Sig  . acetaminophen (TYLENOL) 500 MG tablet Take 2 tablets (1,000 mg total) by mouth every 8 (eight) hours as needed.  [name] atorvastatin (LIPITOR) 40 MG tablet Take 1 tablet (40 mg total) by mouth daily.  . Cholecalciferol (VITAMIN D3) 2000 units TABS Take 1 capsule by  mouth.  . citalopram (CELEXA) 20 MG tablet TAKE 1 TABLET(20 MG) BY MOUTH DAILY  . Cyanocobalamin (B-12) 500 MCG TABS Take 1 tablet by mouth daily.  [name] glucose blood test strip One Touch Zero Flex glucose test strips, use as directed  .  losartan-hydrochlorothiazide (HYZAAR) 100-25 MG tablet TAKE 1 TABLET BY MOUTH DAILY  . metFORMIN (GLUCOPHAGE) 500 MG tablet Take 1 tablet (500 mg total) by mouth 2 (two) times daily.  . ONE TOUCH LANCETS MISC 1 Units by Does not apply route as directed.  . tadalafil (CIALIS) 20 MG tablet Take 1 tablet (20 mg total) by mouth daily as needed for erectile dysfunction.    PHQ 2/9 Scores 11/04/2017 07/23/2017 04/21/2017 09/16/2016  PHQ - 2 Score 0 0 0 0  PHQ- 9 Score 0 0 0 -    Physical Exam  Constitutional: He is oriented to person, place, and time. He appears well-developed. No distress.  HENT:  Head: Normocephalic and atraumatic.  Eyes: Pupils are equal, round, and reactive to light.  Neck: Normal range of motion. Neck supple. Carotid bruit is not present.  Cardiovascular: Normal rate, regular rhythm and normal heart sounds.  Pulmonary/Chest: Effort normal and breath sounds normal. No respiratory distress. He has no wheezes. He has no rhonchi.  Abdominal: Soft.  Musculoskeletal: Normal range of motion. He exhibits no edema or tenderness.  Lymphadenopathy:    He has no cervical adenopathy.  Neurological: He is alert and oriented to person, place, and time. He has normal strength and normal reflexes. No sensory deficit. Gait normal.  Skin: Skin is warm and dry. No rash noted.  Psychiatric: He has a normal mood and affect. His speech is normal and behavior is normal. Thought content normal.  Nursing note and vitals reviewed.   BP 132/66 (BP Location: Right Arm, Patient Position: Sitting, Cuff Size: Normal)   Pulse 65   Ht 5\' 8"  (1.727 m)   Wt 171 lb (77.6 kg)   SpO2 98%   BMI 26.00 kg/m   Assessment and Plan: 1. Essential hypertension controlled - losartan-hydrochlorothiazide (HYZAAR) 100-25 MG tablet; Take 1 tablet by mouth daily.  Dispense: 30 tablet; Refill: 0 - Comprehensive metabolic panel  2. Controlled type 2 diabetes mellitus without complication, without long-term current  use of insulin (HCC) Continue current medications - Hemoglobin A1c  3. Hyperlipidemia associated with type 2 diabetes mellitus ([place]) On statin therapy - atorvastatin (LIPITOR) 40 MG tablet; Take 1 tablet (40 mg total) by mouth daily.  Dispense: 90 tablet; Refill: 3  4. Major depression single episode, in partial remission ([place]) Doing well on Citalopram - citalopram (CELEXA) 20 MG tablet; Take 1 tablet (20 mg total) by mouth daily.  Dispense: 30 tablet; Refill: 5  5. Benign prostatic hyperplasia with nocturia Reschedule appt with Urology - PSA   Partially dictated using [place]. Any errors are unintentional.  Halina Maidens, MD [facility]  11/04/2017

## 2017-11-05 LAB — COMPREHENSIVE METABOLIC PANEL
ALBUMIN: 4.9 g/dL — AB (ref 3.6–4.8)
ALK PHOS: 72 IU/L (ref 39–117)
ALT: 20 IU/L (ref 0–44)
AST: 18 IU/L (ref 0–40)
Albumin/Globulin Ratio: 2.2 (ref 1.2–2.2)
BILIRUBIN TOTAL: 0.6 mg/dL (ref 0.0–1.2)
BUN/Creatinine Ratio: 9 — ABNORMAL LOW (ref 10–24)
BUN: 7 mg/dL — AB (ref 8–27)
CHLORIDE: 89 mmol/L — AB (ref 96–106)
CO2: 23 mmol/L (ref 20–29)
CREATININE: 0.78 mg/dL (ref 0.76–1.27)
Calcium: 9.7 mg/dL (ref 8.6–10.2)
GFR calc non Af Amer: 96 mL/min/{1.73_m2} (ref >59)
GFR, EST AFRICAN AMERICAN: 111 mL/min/{1.73_m2} (ref >59)
GLUCOSE: 176 mg/dL — AB (ref 65–99)
Globulin, Total: 2.2 g/dL (ref 1.5–4.5)
POTASSIUM: 4.3 mmol/L (ref 3.5–5.2)
Sodium: 130 mmol/L — ABNORMAL LOW (ref 134–144)
Total Protein: 7.1 g/dL (ref 6.0–8.5)

## 2017-11-05 LAB — PSA: Prostate Specific Ag, Serum: 2.5 ng/mL (ref 0.0–4.0)

## 2017-11-05 LAB — HEMOGLOBIN A1C
ESTIMATED AVERAGE GLUCOSE: 186 mg/dL
Hgb A1c MFr Bld: 8.1 % — ABNORMAL HIGH (ref 4.8–5.6)

## 2017-11-12 ENCOUNTER — Other Ambulatory Visit: Payer: Self-pay | Admitting: Internal Medicine

## 2018-04-14 ENCOUNTER — Other Ambulatory Visit: Payer: Self-pay | Admitting: Internal Medicine

## 2018-04-15 ENCOUNTER — Telehealth: Payer: Self-pay | Admitting: Urology

## 2018-04-15 NOTE — Telephone Encounter (Signed)
Spoke to patient and informed him that he should not need labs drawn. He had a PSA drawn in September.

## 2018-04-15 NOTE — Telephone Encounter (Signed)
Pt wife called to make pt his yearly follow up, hasn't been seen since 2018. I made appt for upcoming Monday 3/09, pt wife stated that he usually gets blood work done before appt. Can someone put orders in and let the pt know if he is able to do this before this appt. I wasn't sure if he needed be seen prior, due to being over a year sinc last seen. Please advise. Thanks.

## 2018-04-19 NOTE — Progress Notes (Signed)
04/20/2018 9:11 AM   Calvin Sanders 16-Jul-1955 [id number]  Referring provider: Glean Hess, MD [address] [place], [place] [address]  Chief Complaint  Patient presents with  . Follow-up    Yearly exam    HPI: Calvin Sanders is a 63 year old Caucasian male with a history of ED and BPH with LUTS who presents today for his yearly follow up.    BPH with LU TS His IPSS score today is 0, which is no lower urinary tract symptomatology. He is delighted with his quality life due to his urinary symptoms. His previous IPSS score was 0/0.  His previous PVR was 20 mL.    He has not complaints at this time.  He denies any dysuria, hematuria or suprapubic pain.   His has not had any prostate surgeries or biopsies.     He also denies any recent fevers, chills, nausea or vomiting.  Brother with prostate cancer and he is 65 years of age.     IPSS    Row Name 04/20/18 0800         International Prostate Symptom Score   How often have you had the sensation of not emptying your bladder?  Not at All     How often have you had to urinate less than every two hours?  Not at All     How often have you found you stopped and started again several times when you urinated?  Not at All     How often have you found it difficult to postpone urination?  Not at All     How often have you had a weak urinary stream?  Not at All     How often have you had to strain to start urination?  Not at All     How many times did you typically get up at night to urinate?  None     Total IPSS Score  0        Score:  1-7 Mild 8-19 Moderate 20-35 Severe  Erectile dysfunction His SHIM score is 15, which is mild to moderate ED.   His previous SHIM score was 20.  He has been having difficulty with erections for several years.   His major complaint is maintaining.  His libido is good.   His risk factors for ED are smoking, DM, BPH, alcohol abuse and HTN.  He denies any painful erections or curvatures  with his erections.   He has not taking PDE5i in a long time.  SHIM    Row Name 04/20/18 0900         SHIM: Over the last 6 months:   How do you rate your confidence that you could get and keep an erection?  Moderate     When you had erections with sexual stimulation, how often were your erections hard enough for penetration (entering your partner)?  Sometimes (about half the time)     During sexual intercourse, how often were you able to maintain your erection after you had penetrated (entered) your partner?  Sometimes (about half the time)     During sexual intercourse, how difficult was it to maintain your erection to completion of intercourse?  Difficult     When you attempted sexual intercourse, how often was it satisfactory for you?  Sometimes (about half the time)       SHIM Total Score   SHIM  15        Score: 1-7 Severe  ED 8-11 Moderate ED 12-16 Mild-Moderate ED 17-21 Mild ED 22-25 No ED   PMH: Past Medical History:  Diagnosis Date  . Diabetes mellitus without complication ([place])   . Hypertension     Surgical History: Past Surgical History:  Procedure Laterality Date  . NO PAST SURGERIES      Home Medications:  Allergies as of 04/20/2018   No Known Allergies     Medication List       Accurate as of April 20, 2018  9:11 AM. Always use your most recent med list.        acetaminophen 500 MG tablet Commonly known as:  TYLENOL Take 2 tablets (1,000 mg total) by mouth every 8 (eight) hours as needed.   atorvastatin 40 MG tablet Commonly known as:  LIPITOR Take 1 tablet (40 mg total) by mouth daily.   B-12 500 MCG Tabs Take 1 tablet by mouth daily.   citalopram 20 MG tablet Commonly known as:  CELEXA Take 1 tablet (20 mg total) by mouth daily.   glucose blood test strip One Touch Zero Flex glucose test strips, use as directed   losartan-hydrochlorothiazide 100-25 MG tablet Commonly known as:  HYZAAR Take 1 tablet by mouth daily.     losartan-hydrochlorothiazide 100-25 MG tablet Commonly known as:  HYZAAR TAKE 1 TABLET BY MOUTH DAILY   metFORMIN 500 MG tablet Commonly known as:  GLUCOPHAGE TAKE 1 TABLET(500 MG) BY MOUTH TWICE DAILY   ONE TOUCH LANCETS Misc 1 Units by Does not apply route as directed.   sildenafil 100 MG tablet Commonly known as:  VIAGRA Take 1 tablet (100 mg total) by mouth daily as needed for erectile dysfunction. Take two hours prior to intercourse on an empty stomach   tadalafil 20 MG tablet Commonly known as:  ADCIRCA/CIALIS Take 1 tablet (20 mg total) by mouth daily as needed for erectile dysfunction.   Vitamin D3 50 MCG (2000 UT) Tabs Take 1 capsule by mouth.       Allergies: No Known Allergies  Family History: Family History  Problem Relation Age of Onset  . Diabetes Mother   . Cancer Father        bone  . Heart disease Brother   . Prostate cancer Brother   . Kidney cancer Neg Hx   . Kidney disease Neg Hx   . Bladder Cancer Neg Hx     Social History:  reports that he has been smoking cigarettes. He started smoking about 14 years ago. He has a 5.00 pack-year smoking history. He has never used smokeless tobacco. He reports current alcohol use of about 25.0 standard drinks of alcohol per week. He reports that he does not use drugs.  ROS: UROLOGY Frequent Urination?: No Hard to postpone urination?: No Burning/pain with urination?: No Get up at night to urinate?: No Leakage of urine?: No Urine stream starts and stops?: No Trouble starting stream?: No Do you have to strain to urinate?: No Blood in urine?: No Urinary tract infection?: No Sexually transmitted disease?: No Injury to kidneys or bladder?: No Painful intercourse?: No Weak stream?: No Erection problems?: No Penile pain?: No  Gastrointestinal Nausea?: No Vomiting?: No Indigestion/heartburn?: Yes Diarrhea?: No Constipation?: No  Constitutional Fever: No Night sweats?: No Weight loss?: No Fatigue?:  No  Skin Skin rash/lesions?: No Itching?: No  Eyes Blurred vision?: Yes Double vision?: No  Ears/Nose/Throat Sore throat?: No Sinus problems?: No  Hematologic/Lymphatic Swollen glands?: No Easy bruising?: No  Cardiovascular Leg swelling?: No Chest  pain?: No  Respiratory Cough?: No Shortness of breath?: No  Endocrine Excessive thirst?: No  Musculoskeletal Back pain?: No Joint pain?: No  Neurological Headaches?: Yes Dizziness?: No  Psychologic Depression?: No Anxiety?: No  Physical Exam: BP (!) 170/77   Pulse 60   Temp (!) 97 F (36.1 C) (Oral)   Resp 16   Ht 5\' 8"  (1.727 m)   Wt 179 lb (81.2 kg)   BMI 27.22 kg/m   Constitutional:  Well nourished. Alert and oriented, No acute distress. HEENT: [place] AT, moist mucus membranes.  Trachea midline, no masses. Cardiovascular: No clubbing, cyanosis, or edema. Respiratory: Normal respiratory effort, no increased work of breathing. GI: Abdomen is soft, non tender, non distended, no abdominal masses. Liver and spleen not palpable.  No hernias appreciated.  Stool sample for occult testing is not indicated.   GU: No CVA tenderness.  No bladder fullness or masses.  Patient with circumcised phallus.  Urethral meatus is patent.  No penile discharge. No penile lesions or rashes. Scrotum without lesions, cysts, rashes and/or edema.  Testicles are located scrotally bilaterally. No masses are appreciated in the testicles. Left and right epididymis are normal. Rectal: Patient with  normal sphincter tone. Anus and perineum without scarring or rashes. No rectal masses are appreciated. Prostate is approximately 50 grams, could only palpate the apex and the midportion of the gland, no nodules are appreciated.   Skin: No rashes, bruises or suspicious lesions. Lymph: No cervical or inguinal adenopathy. Neurologic: Grossly intact, no focal deficits, moving all 4 extremities. Psychiatric: Normal mood and affect.   Laboratory  Data: Results for orders placed or performed in visit on 11/04/17  Comprehensive metabolic panel  Result Value Ref Range   Glucose 176 (H) 65 - 99 mg/dL   BUN 7 (L) 8 - 27 mg/dL   Creatinine, Ser 0.78 0.76 - 1.27 mg/dL   GFR calc non Af Amer 96 >59 mL/min/1.73   GFR calc Af Amer 111 >59 mL/min/1.73   BUN/Creatinine Ratio 9 (L) 10 - 24   Sodium 130 (L) 134 - 144 mmol/L   Potassium 4.3 3.5 - 5.2 mmol/L   Chloride 89 (L) 96 - 106 mmol/L   CO2 23 20 - 29 mmol/L   Calcium 9.7 8.6 - 10.2 mg/dL   Total Protein 7.1 6.0 - 8.5 g/dL   Albumin 4.9 (H) 3.6 - 4.8 g/dL   Globulin, Total 2.2 1.5 - 4.5 g/dL   Albumin/Globulin Ratio 2.2 1.2 - 2.2   Bilirubin Total 0.6 0.0 - 1.2 mg/dL   Alkaline Phosphatase 72 39 - 117 IU/L   AST 18 0 - 40 IU/L   ALT 20 0 - 44 IU/L  PSA  Result Value Ref Range   Prostate Specific Ag, Serum 2.5 0.0 - 4.0 ng/mL  Hemoglobin A1c  Result Value Ref Range   Hgb A1c MFr Bld 8.1 (H) 4.8 - 5.6 %   Est. average glucose Bld gHb Est-mCnc 186 mg/dL    Lab Results  Component Value Date   CREATININE 0.78 11/04/2017    PSA History  1.5 ng/mL on 07/16/2013  1.7 ng/mL on 09/21/2014  2.2 ng/mL on 09/21/2015  2.39 ng/mL on 09/20/2016 Component     Latest Ref Rng & Units 09/06/2014 09/21/2015 11/04/2017  Prostate Specific Ag, Serum     0.0 - 4.0 ng/mL 1.7 2.2 2.5    Lab Results  Component Value Date   HGBA1C 8.1 (H) 11/04/2017   I have reviewed the labs  Assessment &  Plan:    1. BPH with LUTS  - IPSS score is 0/0,  it is improving  - Continue conservative management, avoiding bladder irritants and timed voiding's  - RTC in 12 months for IPSS, PSA and exam    2. Erectile dysfunction  - SHIM score is 15, is it worsening  - Continue Viagra, meds refilled  - RTC in 12 months for repeat SHIM score and exam  3. Family history of PCa:   Patient's brother with a h/o PCa.     Return in about 1 year (around 04/20/2019) for IPSS, SHIM, PSA and exam.  Zara Council,  [facility]  [place], [place] [address] [phone]

## 2018-04-20 ENCOUNTER — Encounter: Payer: Self-pay | Admitting: Urology

## 2018-04-20 ENCOUNTER — Ambulatory Visit (INDEPENDENT_AMBULATORY_CARE_PROVIDER_SITE_OTHER): Payer: 59 | Admitting: Urology

## 2018-04-20 ENCOUNTER — Other Ambulatory Visit: Payer: Self-pay

## 2018-04-20 VITALS — BP 170/77 | HR 60 | Temp 97.0°F | Resp 16 | Ht 68.0 in | Wt 179.0 lb

## 2018-04-20 DIAGNOSIS — Z8042 Family history of malignant neoplasm of prostate: Secondary | ICD-10-CM | POA: Diagnosis not present

## 2018-04-20 DIAGNOSIS — N529 Male erectile dysfunction, unspecified: Secondary | ICD-10-CM

## 2018-04-20 DIAGNOSIS — N138 Other obstructive and reflux uropathy: Secondary | ICD-10-CM

## 2018-04-20 DIAGNOSIS — N401 Enlarged prostate with lower urinary tract symptoms: Secondary | ICD-10-CM

## 2018-04-20 MED ORDER — SILDENAFIL CITRATE 100 MG PO TABS: 100.0000 mg | ORAL_TABLET | Freq: Every day | ORAL | 12 refills | Status: DC | PRN

## 2018-05-04 ENCOUNTER — Other Ambulatory Visit: Payer: Self-pay | Admitting: Internal Medicine

## 2018-05-04 DIAGNOSIS — F324 Major depressive disorder, single episode, in partial remission: Secondary | ICD-10-CM

## 2018-05-05 ENCOUNTER — Other Ambulatory Visit: Payer: Self-pay

## 2018-05-05 ENCOUNTER — Ambulatory Visit (INDEPENDENT_AMBULATORY_CARE_PROVIDER_SITE_OTHER): Payer: 59 | Admitting: Internal Medicine

## 2018-05-05 ENCOUNTER — Encounter: Payer: Self-pay | Admitting: Internal Medicine

## 2018-05-05 VITALS — BP 124/80 | HR 60 | Ht 68.0 in | Wt 173.0 lb

## 2018-05-05 DIAGNOSIS — F17200 Nicotine dependence, unspecified, uncomplicated: Secondary | ICD-10-CM | POA: Diagnosis not present

## 2018-05-05 DIAGNOSIS — E1169 Type 2 diabetes mellitus with other specified complication: Secondary | ICD-10-CM | POA: Diagnosis not present

## 2018-05-05 DIAGNOSIS — E119 Type 2 diabetes mellitus without complications: Secondary | ICD-10-CM | POA: Diagnosis not present

## 2018-05-05 DIAGNOSIS — Z125 Encounter for screening for malignant neoplasm of prostate: Secondary | ICD-10-CM

## 2018-05-05 DIAGNOSIS — F1020 Alcohol dependence, uncomplicated: Secondary | ICD-10-CM | POA: Diagnosis not present

## 2018-05-05 DIAGNOSIS — G3184 Mild cognitive impairment, so stated: Secondary | ICD-10-CM

## 2018-05-05 DIAGNOSIS — Z0000 Encounter for general adult medical examination without abnormal findings: Secondary | ICD-10-CM

## 2018-05-05 DIAGNOSIS — F324 Major depressive disorder, single episode, in partial remission: Secondary | ICD-10-CM

## 2018-05-05 DIAGNOSIS — E785 Hyperlipidemia, unspecified: Secondary | ICD-10-CM

## 2018-05-05 DIAGNOSIS — I10 Essential (primary) hypertension: Secondary | ICD-10-CM | POA: Diagnosis not present

## 2018-05-05 LAB — POCT URINALYSIS DIPSTICK
Bilirubin, UA: NEGATIVE
Blood, UA: NEGATIVE
GLUCOSE UA: POSITIVE — AB
Ketones, UA: NEGATIVE
LEUKOCYTES UA: NEGATIVE
Nitrite, UA: NEGATIVE
Protein, UA: POSITIVE — AB
Spec Grav, UA: 1.01 (ref 1.010–1.025)
Urobilinogen, UA: 0.2 E.U./dL
pH, UA: 6 (ref 5.0–8.0)

## 2018-05-05 MED ORDER — METFORMIN HCL 500 MG PO TABS: 1500.0000 mg | ORAL_TABLET | Freq: Every day | ORAL | 1 refills | Status: DC

## 2018-05-05 MED ORDER — LOSARTAN POTASSIUM-HCTZ 100-25 MG PO TABS: 1.0000 | ORAL_TABLET | Freq: Every day | ORAL | 1 refills | Status: DC

## 2018-05-05 NOTE — Progress Notes (Signed)
Date:  05/05/2018   Name:  Calvin Sanders   DOB:  Nov 06, 1955   MRN:  [id number]   Chief Complaint: Annual Exam Calvin Sanders is a 63 y.o. male who presents today for his Complete Annual Exam. He feels well. 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 He reports he is sleeping well. He continues to smoke 1/2 ppd per day and has no interest in quitting.  He does not qualify for LDCT lung cancer screening.  Colonoscopy done 2013 Foot exam 10/2017  Diabetes  He presents for his follow-up diabetic visit. He has type 2 diabetes mellitus. His disease course has been worsening. Pertinent negatives for hypoglycemia include no dizziness or headaches. Pertinent negatives for diabetes include no chest pain and no fatigue. Current diabetic treatment includes oral agent (monotherapy) (increased to three metformin per day). He is compliant with treatment most of the time. His weight is stable. He is following a generally healthy diet. There is no compliance with monitoring of blood glucose. An ACE inhibitor/angiotensin II receptor blocker is being taken. Eye exam is not current.  Hypertension  This is a chronic problem. The problem is controlled. Associated symptoms include shortness of breath. Pertinent negatives include no chest pain, headaches or palpitations. Past treatments include angiotensin blockers and diuretics.  Hyperlipidemia  This is a chronic problem. The problem is controlled. Factors aggravating his hyperlipidemia include smoking. Associated symptoms include shortness of breath. Pertinent negatives include no chest pain or myalgias. Current antihyperlipidemic treatment includes statins.  Depression         This is a chronic problem.The problem is unchanged.  Associated symptoms include decreased concentration.  Associated symptoms include no fatigue, no appetite change, no myalgias and no headaches.  Past treatments include SSRIs - Selective serotonin reuptake inhibitors.   Compliance with treatment is good.  Previous treatment provided significant relief.  Lab Results  Component Value Date   HGBA1C 8.1 (H) 11/04/2017   Lab Results  Component Value Date   CREATININE 0.78 11/04/2017   BUN 7 (L) 11/04/2017   NA 130 (L) 11/04/2017   K 4.3 11/04/2017   CL 89 (L) 11/04/2017   CO2 23 11/04/2017     Review of Systems  Constitutional: Negative for appetite change, chills, diaphoresis, fatigue and unexpected weight change.  HENT: Negative for hearing loss, sinus pressure, tinnitus, trouble swallowing and voice change.   Eyes: Negative for visual disturbance.  Respiratory: Positive for shortness of breath. Negative for choking, chest tightness and wheezing.   Cardiovascular: Negative for chest pain, palpitations and leg swelling.  Gastrointestinal: Negative for abdominal pain, blood in stool, constipation and diarrhea.  Genitourinary: Negative for difficulty urinating, dysuria and frequency.  Musculoskeletal: Negative for arthralgias, back pain and myalgias.  Skin: Negative for color change and rash.  Neurological: Negative for dizziness, syncope and headaches.  Hematological: Negative for adenopathy.  Psychiatric/Behavioral: Positive for decreased concentration and depression. Negative for dysphoric mood and sleep disturbance.    Patient Active Problem List   Diagnosis Date Noted  . MCI (mild cognitive impairment) 03/19/2017  . Major depression single episode, in partial remission ([place]) 03/19/2017  . B12 deficiency 05/22/2016  . Vitamin D deficiency 02/08/2015  . Overweight (BMI 25.0-29.9) 02/07/2015  . 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 Family history of prostate cancer 09/06/2014  . Diabetes mellitus type 2, controlled, without complications ([place]) 74/09/1446  . Hyperlipidemia associated with type 2 diabetes mellitus ([place]) 08/05/2014  . Smoker 08/05/2014  . Excessive drinking  of alcohol 08/05/2014  . Colon polyps 05/14/2014  .  Chronic prostatitis 05/14/2014  . Hypertension 05/14/2014  . BPH (benign prostatic hyperplasia) 05/14/2014    No Known Allergies  Past Surgical History:  Procedure Laterality Date  . NO PAST SURGERIES      Social History   Tobacco Use  . Smoking status: Current Every Day Smoker    Packs/day: 0.50    Years: 45.00    Pack years: 22.50    Types: Cigarettes    Start date: 02/11/1973  . Smokeless tobacco: Never Used  Substance Use Topics  . Alcohol use: Yes    Alcohol/week: 14.0 standard drinks    Types: 14 Cans of beer per week    Comment: cut back from 6 to 2 beers per night  . Drug use: No     Medication list has been reviewed and updated.  Current Meds  Medication Sig  . acetaminophen (TYLENOL) 500 MG tablet Take 2 tablets (1,000 mg total) by mouth every 8 (eight) hours as needed.  [name] atorvastatin (LIPITOR) 40 MG tablet Take 1 tablet (40 mg total) by mouth daily.  . Cholecalciferol (VITAMIN D3) 2000 units TABS Take 1 capsule by mouth.  . citalopram (CELEXA) 20 MG tablet TAKE 1 TABLET(20 MG) BY MOUTH DAILY  . Cyanocobalamin (B-12) 500 MCG TABS Take 1 tablet by mouth daily.  [name] glucose blood test strip One Touch Zero Flex glucose test strips, use as directed  . losartan-hydrochlorothiazide (HYZAAR) 100-25 MG tablet Take 1 tablet by mouth daily.  . metFORMIN (GLUCOPHAGE) 500 MG tablet Take 3 tablets (1,500 mg total) by mouth daily. Patient takes 2 tabs in AM and 1 in PM  . ONE TOUCH LANCETS MISC 1 Units by Does not apply route as directed.  . [DISCONTINUED] losartan-hydrochlorothiazide (HYZAAR) 100-25 MG tablet Take 1 tablet by mouth daily.  . [DISCONTINUED] losartan-hydrochlorothiazide (HYZAAR) 100-25 MG tablet TAKE 1 TABLET BY MOUTH DAILY  . [DISCONTINUED] metFORMIN (GLUCOPHAGE) 500 MG tablet TAKE 1 TABLET(500 MG) BY MOUTH TWICE DAILY (Patient taking differently: Take 1,500 mg by mouth daily. Patient takes 2 tabs in AM and 1 in PM)  . [DISCONTINUED] tadalafil (CIALIS) 20 MG  tablet Take 1 tablet (20 mg total) by mouth daily as needed for erectile dysfunction.    PHQ 2/9 Scores 05/05/2018 11/04/2017 07/23/2017 04/21/2017  PHQ - 2 Score 0 0 0 0  PHQ- 9 Score - 0 0 0   6CIT Screen 05/05/2018  What Year? 0 points  What month? 0 points  What time? 0 points  Count back from 20 0 points  Months in reverse 0 points  Repeat phrase 2 points  Total Score 2     Physical Exam Vitals signs and nursing note reviewed.  Constitutional:      Appearance: Normal appearance. He is well-developed.  HENT:     Head: Normocephalic.     Right Ear: Tympanic membrane, ear canal and external ear normal.     Left Ear: Tympanic membrane, ear canal and external ear normal.     Nose: Nose normal.     Mouth/Throat:     Mouth: Mucous membranes are moist.     Pharynx: Uvula midline.  Eyes:     Conjunctiva/sclera: Conjunctivae normal.     Pupils: Pupils are equal, round, and reactive to light.  Neck:     Musculoskeletal: Normal range of motion and neck supple.     Thyroid: No thyromegaly.     Vascular: No carotid bruit.  Cardiovascular:     Rate and Rhythm: Normal rate and regular rhythm.     Heart sounds: Normal heart sounds. No murmur.  Pulmonary:     Effort: Pulmonary effort is normal.     Breath sounds: Normal breath sounds. No wheezing, rhonchi or rales.  Chest:     Breasts:        Right: No mass.        Left: No mass.  Abdominal:     General: Bowel sounds are normal.     Palpations: Abdomen is soft. There is no mass.     Tenderness: There is no abdominal tenderness. There is no guarding.     Hernia: No hernia is present.  Musculoskeletal:     Right hip: Normal.     Left hip: Normal.     Right knee: Normal.     Left knee: Normal.     Right lower leg: No edema.     Left lower leg: No edema.  Lymphadenopathy:     Cervical: No cervical adenopathy.  Skin:    General: Skin is warm and dry.  Neurological:     Mental Status: He is alert and oriented to person, place,  and time. Mental status is at baseline.     Gait: Gait normal.     Deep Tendon Reflexes: Reflexes are normal and symmetric. Reflexes normal.  Psychiatric:        Speech: Speech normal.        Behavior: Behavior normal. Behavior is cooperative.        Thought Content: Thought content normal.        Cognition and Memory: Cognition normal.        Judgment: Judgment normal.     Comments: Pt defers several questions to his wife - she manages his meds, appointments, etc     Wt Readings from Last 3 Encounters:  05/05/18 173 lb (78.5 kg)  04/20/18 179 lb (81.2 kg)  11/04/17 171 lb (77.6 kg)    BP 124/80   Pulse 60   Ht 5\' 8"  (1.727 m)   Wt 173 lb (78.5 kg)   SpO2 96%   BMI 26.30 kg/m   Assessment and Plan: 1. Annual physical exam Normal exam except for weight Encouraged reducing carbs in diet - POCT urinalysis dipstick  2. Prostate cancer screening DRE deferred to lack of sx Brother with prostate cancer - PSA  3. Controlled type 2 diabetes mellitus without complication, without long-term current use of insulin (HCC) Increased metformin last visit -  Pt reminded to schedule annual eye exam - metFORMIN (GLUCOPHAGE) 500 MG tablet; Take 3 tablets (1,500 mg total) by mouth daily. Patient takes 2 tabs in AM and 1 in PM  Dispense: 270 tablet; Refill: 1 - Comprehensive metabolic panel - Hemoglobin A1c  4. Essential hypertension controlled - losartan-hydrochlorothiazide (HYZAAR) 100-25 MG tablet; Take 1 tablet by mouth daily.  Dispense: 90 tablet; Refill: 1 - CBC with Differential/Platelet  5. Hyperlipidemia associated with type 2 diabetes mellitus ([place]) On statin therapy - Lipid panel  6. Major depression single episode, in partial remission ([place]) Doing well on SSRI  7. MCI (mild cognitive impairment) Stable, continue to monitor, control BP and reduce alchol intake  8. Alcohol dependence, daily use ([place]) Pt congratulated on cutting back to 2 per day  9. Tobacco use  disorder Pt is not interesting in assistance in quitting at this time He does not qualify for LDCT screening   Partially dictated using Dragon  software. Any errors are unintentional.  Halina Maidens, MD [facility]  05/05/2018

## 2018-05-06 ENCOUNTER — Other Ambulatory Visit: Payer: Self-pay | Admitting: Internal Medicine

## 2018-05-06 DIAGNOSIS — E119 Type 2 diabetes mellitus without complications: Secondary | ICD-10-CM

## 2018-05-06 DIAGNOSIS — E118 Type 2 diabetes mellitus with unspecified complications: Secondary | ICD-10-CM

## 2018-05-06 LAB — LIPID PANEL
CHOL/HDL RATIO: 3.2 ratio (ref 0.0–5.0)
Cholesterol, Total: 148 mg/dL (ref 100–199)
HDL: 46 mg/dL (ref >39)
LDL Calculated: 81 mg/dL (ref 0–99)
Triglycerides: 104 mg/dL (ref 0–149)
VLDL Cholesterol Cal: 21 mg/dL (ref 5–40)

## 2018-05-06 LAB — COMPREHENSIVE METABOLIC PANEL
A/G RATIO: 2.3 — AB (ref 1.2–2.2)
ALT: 23 IU/L (ref 0–44)
AST: 17 IU/L (ref 0–40)
Albumin: 5 g/dL — ABNORMAL HIGH (ref 3.8–4.8)
Alkaline Phosphatase: 65 IU/L (ref 39–117)
BILIRUBIN TOTAL: 0.6 mg/dL (ref 0.0–1.2)
BUN / CREAT RATIO: 8 — AB (ref 10–24)
BUN: 8 mg/dL (ref 8–27)
CHLORIDE: 92 mmol/L — AB (ref 96–106)
CO2: 22 mmol/L (ref 20–29)
Calcium: 9.8 mg/dL (ref 8.6–10.2)
Creatinine, Ser: 0.97 mg/dL (ref 0.76–1.27)
GFR calc non Af Amer: 82 mL/min/{1.73_m2} (ref >59)
GFR, EST AFRICAN AMERICAN: 95 mL/min/{1.73_m2} (ref >59)
GLOBULIN, TOTAL: 2.2 g/dL (ref 1.5–4.5)
GLUCOSE: 164 mg/dL — AB (ref 65–99)
POTASSIUM: 4.6 mmol/L (ref 3.5–5.2)
Sodium: 132 mmol/L — ABNORMAL LOW (ref 134–144)
Total Protein: 7.2 g/dL (ref 6.0–8.5)

## 2018-05-06 LAB — CBC WITH DIFFERENTIAL/PLATELET
BASOS ABS: 0 10*3/uL (ref 0.0–0.2)
Basos: 1 %
EOS (ABSOLUTE): 0.1 10*3/uL (ref 0.0–0.4)
Eos: 1 %
HEMOGLOBIN: 15.5 g/dL (ref 13.0–17.7)
Hematocrit: 43.1 % (ref 37.5–51.0)
Immature Grans (Abs): 0 10*3/uL (ref 0.0–0.1)
Immature Granulocytes: 1 %
LYMPHS ABS: 1.8 10*3/uL (ref 0.7–3.1)
Lymphs: 25 %
MCH: 34 pg — ABNORMAL HIGH (ref 26.6–33.0)
MCHC: 36 g/dL — AB (ref 31.5–35.7)
MCV: 95 fL (ref 79–97)
MONOCYTES: 7 %
MONOS ABS: 0.5 10*3/uL (ref 0.1–0.9)
Neutrophils Absolute: 4.8 10*3/uL (ref 1.4–7.0)
Neutrophils: 65 %
Platelets: 230 10*3/uL (ref 150–450)
RBC: 4.56 x10E6/uL (ref 4.14–5.80)
RDW: 12.4 % (ref 11.6–15.4)
WBC: 7.1 10*3/uL (ref 3.4–10.8)

## 2018-05-06 LAB — HEMOGLOBIN A1C
ESTIMATED AVERAGE GLUCOSE: 226 mg/dL
HEMOGLOBIN A1C: 9.5 % — AB (ref 4.8–5.6)

## 2018-05-06 LAB — PSA: Prostate Specific Ag, Serum: 2.6 ng/mL (ref 0.0–4.0)

## 2018-05-06 MED ORDER — DAPAGLIFLOZIN PROPANEDIOL 10 MG PO TABS: 10.0000 mg | ORAL_TABLET | Freq: Every day | ORAL | 1 refills | Status: DC

## 2018-05-21 ENCOUNTER — Other Ambulatory Visit: Payer: Self-pay | Admitting: Internal Medicine

## 2018-05-21 DIAGNOSIS — I10 Essential (primary) hypertension: Secondary | ICD-10-CM

## 2018-09-24 ENCOUNTER — Ambulatory Visit: Payer: 59 | Admitting: Internal Medicine

## 2018-09-28 ENCOUNTER — Other Ambulatory Visit: Payer: Self-pay

## 2018-09-28 ENCOUNTER — Ambulatory Visit (INDEPENDENT_AMBULATORY_CARE_PROVIDER_SITE_OTHER): Payer: 59 | Admitting: Internal Medicine

## 2018-09-28 ENCOUNTER — Encounter: Payer: Self-pay | Admitting: Internal Medicine

## 2018-09-28 VITALS — BP 116/64 | HR 78 | Ht 68.0 in | Wt 160.0 lb

## 2018-09-28 DIAGNOSIS — E118 Type 2 diabetes mellitus with unspecified complications: Secondary | ICD-10-CM

## 2018-09-28 DIAGNOSIS — F1020 Alcohol dependence, uncomplicated: Secondary | ICD-10-CM

## 2018-09-28 DIAGNOSIS — E785 Hyperlipidemia, unspecified: Secondary | ICD-10-CM

## 2018-09-28 DIAGNOSIS — E1169 Type 2 diabetes mellitus with other specified complication: Secondary | ICD-10-CM

## 2018-09-28 DIAGNOSIS — F324 Major depressive disorder, single episode, in partial remission: Secondary | ICD-10-CM | POA: Diagnosis not present

## 2018-09-28 DIAGNOSIS — I10 Essential (primary) hypertension: Secondary | ICD-10-CM

## 2018-09-28 MED ORDER — ATORVASTATIN CALCIUM 40 MG PO TABS: 40.0000 mg | ORAL_TABLET | Freq: Every day | ORAL | 3 refills | Status: DC

## 2018-09-28 MED ORDER — CITALOPRAM HYDROBROMIDE 20 MG PO TABS: 20.0000 mg | ORAL_TABLET | Freq: Every day | ORAL | 3 refills | Status: DC

## 2018-09-28 MED ORDER — LOSARTAN POTASSIUM-HCTZ 100-25 MG PO TABS: 1.0000 | ORAL_TABLET | Freq: Every day | ORAL | 3 refills | Status: DC

## 2018-09-28 MED ORDER — DAPAGLIFLOZIN PROPANEDIOL 10 MG PO TABS: 10.0000 mg | ORAL_TABLET | Freq: Every day | ORAL | 3 refills | Status: DC

## 2018-09-28 MED ORDER — METFORMIN HCL 500 MG PO TABS: 1500.0000 mg | ORAL_TABLET | Freq: Every day | ORAL | 3 refills | Status: DC

## 2018-09-28 NOTE — Progress Notes (Signed)
Date:  09/28/2018   Name:  Calvin Sanders   DOB:  Aug 25, 1955   MRN:  [id number]   Chief Complaint: Diabetes (Follow up. Haven't tested BS in a while at home. ) and Hypertension  Diabetes He presents for his follow-up diabetic visit. He has type 2 diabetes mellitus. His disease course has been worsening. Pertinent negatives for hypoglycemia include no dizziness or headaches. Pertinent negatives for diabetes include no chest pain and no fatigue. Current diabetic treatment includes oral agent (dual therapy) (on metformin but farxiga added last visit). His weight is decreasing steadily. There is no compliance with monitoring of blood glucose. An ACE inhibitor/angiotensin II receptor blocker is being taken. Eye exam is not current.  Hypertension This is a chronic problem. The problem is controlled. Pertinent negatives include no chest pain, headaches, palpitations or shortness of breath. Past treatments include diuretics and angiotensin blockers. The current treatment provides significant improvement.  Depression        This is a chronic problem.  The problem has been resolved since onset.  Associated symptoms include no fatigue and no headaches.  Past treatments include SSRIs - Selective serotonin reuptake inhibitors.  Compliance with treatment is good.  Previous treatment provided significant relief.  (mild cognitive impairment)  Lab Results  Component Value Date   HGBA1C 9.5 (H) 05/05/2018   Lab Results  Component Value Date   CREATININE 0.97 05/05/2018   BUN 8 05/05/2018   NA 132 (L) 05/05/2018   K 4.6 05/05/2018   CL 92 (L) 05/05/2018   CO2 22 05/05/2018   Lab Results  Component Value Date   WBC 7.1 05/05/2018   HGB 15.5 05/05/2018   HCT 43.1 05/05/2018   MCV 95 05/05/2018   PLT 230 05/05/2018   Lab Results  Component Value Date   CHOL 148 05/05/2018   HDL 46 05/05/2018   LDLCALC 81 05/05/2018   TRIG 104 05/05/2018   CHOLHDL 3.2 05/05/2018   Review of Systems   Constitutional: Positive for unexpected weight change (19 lbs since March). Negative for chills, fatigue and fever.  Eyes: Negative for visual disturbance.  Respiratory: Negative for cough, chest tightness, shortness of breath and wheezing.   Cardiovascular: Negative for chest pain, palpitations and leg swelling.  Gastrointestinal: Negative for abdominal pain, diarrhea and nausea.  Genitourinary: Negative for discharge and dysuria.  Neurological: Negative for dizziness and headaches.  Psychiatric/Behavioral: Positive for depression. Negative for dysphoric mood and sleep disturbance.    Patient Active Problem List   Diagnosis Date Noted  . MCI (mild cognitive impairment) 03/19/2017  . Major depression single episode, in partial remission ([place]) 03/19/2017  . B12 deficiency 05/22/2016  . Vitamin D deficiency 02/08/2015  . Overweight (BMI 25.0-29.9) 02/07/2015  . 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 Family history of prostate cancer 09/06/2014  . DM (diabetes mellitus), type 2 with complications ([place]) 22/63/3354  . Hyperlipidemia associated with type 2 diabetes mellitus ([place]) 08/05/2014  . Tobacco use disorder 08/05/2014  . Alcohol dependence, daily use ([place]) 08/05/2014  . Colon polyps 05/14/2014  . Chronic prostatitis 05/14/2014  . Hypertension 05/14/2014  . BPH (benign prostatic hyperplasia) 05/14/2014    No Known Allergies  Past Surgical History:  Procedure Laterality Date  . NO PAST SURGERIES      Social History   Tobacco Use  . Smoking status: Current Every Day Smoker    Packs/day: 0.50    Years: 45.00    Pack years: 22.50    Types:  Cigarettes    Start date: 02/11/1973  . Smokeless tobacco: Never Used  Substance Use Topics  . Alcohol use: Yes    Alcohol/week: 4.0 standard drinks    Types: 4 Cans of beer per week    Comment: cut back from 6 beers per day to 2 beers on saturday and sunday only  . Drug use: No     Medication list has been reviewed  and updated.  Current Meds  Medication Sig  . acetaminophen (TYLENOL) 500 MG tablet Take 2 tablets (1,000 mg total) by mouth every 8 (eight) hours as needed.  [name] atorvastatin (LIPITOR) 40 MG tablet Take 1 tablet (40 mg total) by mouth daily.  . Cholecalciferol (VITAMIN D3) 2000 units TABS Take 1 capsule by mouth.  . citalopram (CELEXA) 20 MG tablet Take 1 tablet (20 mg total) by mouth daily.  . Cyanocobalamin (B-12) 500 MCG TABS Take 1 tablet by mouth daily.  . dapagliflozin propanediol (FARXIGA) 10 MG TABS tablet Take 10 mg by mouth daily.  [name] glucose blood test strip One Touch Zero Flex glucose test strips, use as directed  . losartan-hydrochlorothiazide (HYZAAR) 100-25 MG tablet Take 1 tablet by mouth daily.  . metFORMIN (GLUCOPHAGE) 500 MG tablet Take 3 tablets (1,500 mg total) by mouth daily. Patient takes 2 tabs in AM and 1 in PM  . ONE TOUCH LANCETS MISC 1 Units by Does not apply route as directed.  . sildenafil (VIAGRA) 100 MG tablet Take 1 tablet (100 mg total) by mouth daily as needed for erectile dysfunction. Take two hours prior to intercourse on an empty stomach  . [DISCONTINUED] atorvastatin (LIPITOR) 40 MG tablet Take 1 tablet (40 mg total) by mouth daily.  . [DISCONTINUED] citalopram (CELEXA) 20 MG tablet TAKE 1 TABLET(20 MG) BY MOUTH DAILY  . [DISCONTINUED] dapagliflozin propanediol (FARXIGA) 10 MG TABS tablet Take 10 mg by mouth daily.  . [DISCONTINUED] losartan-hydrochlorothiazide (HYZAAR) 100-25 MG tablet TAKE 1 TABLET BY MOUTH DAILY  . [DISCONTINUED] metFORMIN (GLUCOPHAGE) 500 MG tablet Take 3 tablets (1,500 mg total) by mouth daily. Patient takes 2 tabs in AM and 1 in PM    [facility] 2/9 Scores 09/28/2018 05/05/2018 11/04/2017 07/23/2017  PHQ - 2 Score 0 0 0 0  PHQ- 9 Score - - 0 0    BP Readings from Last 3 Encounters:  09/28/18 116/64  05/05/18 124/80  04/20/18 (!) 170/77    Physical Exam Vitals signs and nursing note reviewed.  Constitutional:      General: He is not  in acute distress.    Appearance: Normal appearance. He is well-developed.  HENT:     Head: Normocephalic and atraumatic.  Neck:     Musculoskeletal: Normal range of motion.     Vascular: No carotid bruit.  Cardiovascular:     Rate and Rhythm: Normal rate and regular rhythm.     Pulses: Normal pulses.     Heart sounds: No murmur.  Pulmonary:     Effort: Pulmonary effort is normal. No respiratory distress.     Breath sounds: No wheezing or rhonchi.  Musculoskeletal: Normal range of motion.     Right lower leg: No edema.     Left lower leg: No edema.  Lymphadenopathy:     Cervical: No cervical adenopathy.  Skin:    General: Skin is warm and dry.     Findings: No rash.  Neurological:     Mental Status: He is alert and oriented to person, place, and time.  Psychiatric:  Behavior: Behavior normal.        Thought Content: Thought content normal.     Wt Readings from Last 3 Encounters:  09/28/18 160 lb (72.6 kg)  05/05/18 173 lb (78.5 kg)  04/20/18 179 lb (81.2 kg)    BP 116/64   Pulse 78   Ht 5\' 8"  (1.727 m)   Wt 160 lb (72.6 kg)   SpO2 94%   BMI 24.33 kg/m   Assessment and Plan: 1. DM (diabetes mellitus), type 2 with complications (HCC) Clinically stable with excellent weight loss and expect better A1C since also consuming fewer beers per week Pt is not checking BS - misplaced glucometer He feels well with no chest pain, sob, blurred vision, change in appetite He denies dysuria or sx of yeast infection since starting Farxiga - Basic metabolic panel - Hemoglobin A1c - metFORMIN (GLUCOPHAGE) 500 MG tablet; Take 3 tablets (1,500 mg total) by mouth daily. Patient takes 2 tabs in AM and 1 in PM  Dispense: 270 tablet; Refill: 3 - dapagliflozin propanediol (FARXIGA) 10 MG TABS tablet; Take 10 mg by mouth daily.  Dispense: 90 tablet; Refill: 3  2. Essential hypertension BP controlled with no s/s of CAD, heart failure, edema Now on Farxiga to aid in Primary prevention  of CHF - losartan-hydrochlorothiazide (HYZAAR) 100-25 MG tablet; Take 1 tablet by mouth daily.  Dispense: 90 tablet; Refill: 3  3. Hyperlipidemia associated with type 2 diabetes mellitus ([place]) Continue statin therapy; no side effects of myalgia, abd pain or other intolerance - atorvastatin (LIPITOR) 40 MG tablet; Take 1 tablet (40 mg total) by mouth daily.  Dispense: 90 tablet; Refill: 3  4. Major depression single episode, in partial remission ([place]) Doing well with no issues on Celexa Mild memory impairment appears stable - wife is supportive and manages medications and MD visits - citalopram (CELEXA) 20 MG tablet; Take 1 tablet (20 mg total) by mouth daily.  Dispense: 90 tablet; Refill: 3   Partially dictated using Editor, commissioning. Any errors are unintentional.  Halina Maidens, MD [facility]  09/28/2018

## 2018-09-28 NOTE — Patient Instructions (Signed)
Schedule Diabetic Eye appointment  Use Farxiga coupon

## 2018-09-29 LAB — BASIC METABOLIC PANEL
BUN/Creatinine Ratio: 10 (ref 10–24)
BUN: 12 mg/dL (ref 8–27)
CO2: 25 mmol/L (ref 20–29)
Calcium: 10 mg/dL (ref 8.6–10.2)
Chloride: 97 mmol/L (ref 96–106)
Creatinine, Ser: 1.19 mg/dL (ref 0.76–1.27)
GFR calc Af Amer: 74 mL/min/{1.73_m2} (ref >59)
GFR calc non Af Amer: 64 mL/min/{1.73_m2} (ref >59)
Glucose: 119 mg/dL — ABNORMAL HIGH (ref 65–99)
Potassium: 4.4 mmol/L (ref 3.5–5.2)
Sodium: 136 mmol/L (ref 134–144)

## 2018-09-29 LAB — HEMOGLOBIN A1C
Est. average glucose Bld gHb Est-mCnc: 171 mg/dL
Hgb A1c MFr Bld: 7.6 % — ABNORMAL HIGH (ref 4.8–5.6)

## 2018-11-26 ENCOUNTER — Ambulatory Visit (INDEPENDENT_AMBULATORY_CARE_PROVIDER_SITE_OTHER): Payer: 59

## 2018-11-26 ENCOUNTER — Other Ambulatory Visit: Payer: Self-pay

## 2018-11-26 DIAGNOSIS — Z23 Encounter for immunization: Secondary | ICD-10-CM | POA: Diagnosis not present

## 2019-02-03 ENCOUNTER — Ambulatory Visit: Payer: 59 | Admitting: Internal Medicine

## 2019-02-24 ENCOUNTER — Encounter: Payer: Self-pay | Admitting: Internal Medicine

## 2019-02-24 ENCOUNTER — Ambulatory Visit: Payer: 59 | Admitting: Internal Medicine

## 2019-02-24 ENCOUNTER — Other Ambulatory Visit: Payer: Self-pay

## 2019-02-24 VITALS — BP 112/60 | HR 64 | Temp 98.3°F | Ht 68.0 in | Wt 168.0 lb

## 2019-02-24 DIAGNOSIS — E785 Hyperlipidemia, unspecified: Secondary | ICD-10-CM

## 2019-02-24 DIAGNOSIS — Z23 Encounter for immunization: Secondary | ICD-10-CM | POA: Diagnosis not present

## 2019-02-24 DIAGNOSIS — E118 Type 2 diabetes mellitus with unspecified complications: Secondary | ICD-10-CM | POA: Diagnosis not present

## 2019-02-24 DIAGNOSIS — I10 Essential (primary) hypertension: Secondary | ICD-10-CM | POA: Diagnosis not present

## 2019-02-24 DIAGNOSIS — E1169 Type 2 diabetes mellitus with other specified complication: Secondary | ICD-10-CM

## 2019-02-24 DIAGNOSIS — F17201 Nicotine dependence, unspecified, in remission: Secondary | ICD-10-CM | POA: Diagnosis not present

## 2019-02-24 NOTE — Progress Notes (Signed)
Date:  02/24/2019   Name:  Calvin Sanders   DOB:  October 23, 1955   MRN:  [id number]   Chief Complaint: Diabetes (Follow up.) and Hypertension  Diabetes He presents for his follow-up diabetic visit. He has type 2 diabetes mellitus. Pertinent negatives for hypoglycemia include no headaches or tremors. Pertinent negatives for diabetes include no chest pain, no fatigue, no polydipsia and no polyuria. Current diabetic treatments: farxiga and metformin. He is compliant with treatment all of the time. His weight is stable. There is no compliance with monitoring of blood glucose. An ACE inhibitor/angiotensin II receptor blocker is being taken. Eye exam is not current.  Hypertension This is a chronic problem. The problem is controlled. Pertinent negatives include no chest pain, headaches, palpitations or shortness of breath. Past treatments include angiotensin blockers and diuretics. The current treatment provides significant improvement. There are no compliance problems.   Hyperlipidemia This is a chronic problem. Pertinent negatives include no chest pain or shortness of breath. Current antihyperlipidemic treatment includes statins. The current treatment provides significant improvement of lipids.    Lab Results  Component Value Date   CREATININE 1.19 09/28/2018   BUN 12 09/28/2018   NA 136 09/28/2018   K 4.4 09/28/2018   CL 97 09/28/2018   CO2 25 09/28/2018   Lab Results  Component Value Date   CHOL 148 05/05/2018   HDL 46 05/05/2018   LDLCALC 81 05/05/2018   TRIG 104 05/05/2018   CHOLHDL 3.2 05/05/2018   Lab Results  Component Value Date   TSH 1.160 03/19/2017   Lab Results  Component Value Date   HGBA1C 7.6 (H) 09/28/2018     Review of Systems  Constitutional: Negative for appetite change, fatigue and unexpected weight change.  Eyes: Negative for visual disturbance.  Respiratory: Negative for cough, shortness of breath and wheezing.   Cardiovascular: Negative for chest pain,  palpitations and leg swelling.  Gastrointestinal: Negative for abdominal pain and blood in stool.  Endocrine: Negative for polydipsia and polyuria.  Genitourinary: Negative for dysuria and hematuria.  Skin: Negative for color change and rash.  Neurological: Negative for tremors, numbness and headaches.  Psychiatric/Behavioral: Negative for dysphoric mood.    Patient Active Problem List   Diagnosis Date Noted  . MCI (mild cognitive impairment) 03/19/2017  . Major depression single episode, in partial remission ([place]) 03/19/2017  . B12 deficiency 05/22/2016  . Vitamin D deficiency 02/08/2015  . 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 Family history of prostate cancer 09/06/2014  . DM (diabetes mellitus), type 2 with complications ([place]) [id number]  . Hyperlipidemia associated with type 2 diabetes mellitus ([place]) 08/05/2014  . Tobacco use disorder, severe, in early remission 08/05/2014  . Colon polyps 05/14/2014  . Chronic prostatitis 05/14/2014  . Essential hypertension 05/14/2014  . BPH (benign prostatic hyperplasia) 05/14/2014    No Known Allergies  Past Surgical History:  Procedure Laterality Date  . NO PAST SURGERIES      Social History   Tobacco Use  . Smoking status: Former Smoker    Packs/day: 0.50    Years: 45.00    Pack years: 22.50    Types: Cigarettes    Start date: 02/11/1973    Quit date: 02/07/2019    Years since quitting: 0.0  . Smokeless tobacco: Never Used  Substance Use Topics  . Alcohol use: Yes    Alcohol/week: 4.0 standard drinks    Types: 4 Cans of beer per week    Comment: cut back from  6 beers per day to 2 beers on saturday and sunday only  . Drug use: No     Medication list has been reviewed and updated.  Current Meds  Medication Sig  . acetaminophen (TYLENOL) 500 MG tablet Take 2 tablets (1,000 mg total) by mouth every 8 (eight) hours as needed.  [name] atorvastatin (LIPITOR) 40 MG tablet Take 1 tablet (40 mg total) by mouth daily.   . Cholecalciferol (VITAMIN D3) 2000 units TABS Take 1 capsule by mouth.  . citalopram (CELEXA) 20 MG tablet Take 1 tablet (20 mg total) by mouth daily.  . Cyanocobalamin (B-12) 500 MCG TABS Take 1 tablet by mouth daily.  . dapagliflozin propanediol (FARXIGA) 10 MG TABS tablet Take 10 mg by mouth daily.  [name] glucose blood test strip One Touch Zero Flex glucose test strips, use as directed  . losartan-hydrochlorothiazide (HYZAAR) 100-25 MG tablet Take 1 tablet by mouth daily.  . metFORMIN (GLUCOPHAGE) 500 MG tablet Take 3 tablets (1,500 mg total) by mouth daily. Patient takes 2 tabs in AM and 1 in PM  . ONE TOUCH LANCETS MISC 1 Units by Does not apply route as directed.  . sildenafil (VIAGRA) 100 MG tablet Take 1 tablet (100 mg total) by mouth daily as needed for erectile dysfunction. Take two hours prior to intercourse on an empty stomach    PHQ 2/9 Scores 02/24/2019 09/28/2018 05/05/2018 11/04/2017  PHQ - 2 Score 0 0 0 0  PHQ- 9 Score 0 - - 0    BP Readings from Last 3 Encounters:  02/24/19 112/60  09/28/18 116/64  05/05/18 124/80    Physical Exam Vitals and nursing note reviewed.  Constitutional:      General: He is not in acute distress.    Appearance: He is well-developed.  HENT:     Head: Normocephalic and atraumatic.  Cardiovascular:     Rate and Rhythm: Normal rate and regular rhythm.     Pulses: Normal pulses.     Heart sounds: No murmur.  Pulmonary:     Effort: Pulmonary effort is normal. No respiratory distress.     Breath sounds: No wheezing or rhonchi.  Musculoskeletal:     Cervical back: Normal range of motion.     Right lower leg: No edema.     Left lower leg: No edema.  Lymphadenopathy:     Cervical: No cervical adenopathy.  Skin:    General: Skin is warm and dry.     Capillary Refill: Capillary refill takes less than 2 seconds.     Findings: No rash.  Neurological:     General: No focal deficit present.     Mental Status: He is alert and oriented to person,  place, and time.  Psychiatric:        Behavior: Behavior normal.        Thought Content: Thought content normal.     Wt Readings from Last 3 Encounters:  02/24/19 168 lb (76.2 kg)  09/28/18 160 lb (72.6 kg)  05/05/18 173 lb (78.5 kg)    BP 112/60   Pulse 64   Temp 98.3 F (36.8 C) (Oral)   Ht 5\' 8"  (1.727 m)   Wt 168 lb (76.2 kg)   SpO2 96%   BMI 25.54 kg/m   Assessment and Plan: 1. DM (diabetes mellitus), type 2 with complications ([place]) Clinically stable by exam and report without s/s of hypoglycemia. DM complicated by HTN. Tolerating medications well without side effects or other concerns. Will continue [place]  and metformin - Hemoglobin A1c - Comprehensive metabolic panel  2. Essential hypertension Clinically stable exam with well controlled BP on Hyzaar 100/25. Tolerating medications without side effects at this time. Pt to continue current regimen and low sodium diet; benefits of regular exercise as able discussed.  3. Hyperlipidemia associated with type 2 diabetes mellitus ([place]) Tolerating statin medication without side effects at this time LDL is at goal of < 70 on current dose Continue same therapy without change at this time  4. Tobacco use disorder, severe, in early remission Pt reports that he quit smoking 2 weeks ago - so far doing well and determined not to resume smoking.  5. Need for vaccination for pneumococcus - Pneumococcal polysaccharide vaccine 23-valent greater than or equal to 2yo subcutaneous/IM   Partially dictated using Editor, commissioning. Any errors are unintentional.  Halina Maidens, MD [facility]  02/24/2019

## 2019-02-24 NOTE — Patient Instructions (Addendum)
Please schedule Diabetic eye exam.  And ask them to send me the notes - I need to get these after each yearly visit.   Pneumococcal Polysaccharide Vaccine (PPSV23): What You Need to Know 1. Why get vaccinated? Pneumococcal polysaccharide vaccine (PPSV23) can prevent pneumococcal disease. Pneumococcal disease refers to any illness caused by pneumococcal bacteria. These bacteria can cause many types of illnesses, including pneumonia, which is an infection of the lungs. Pneumococcal bacteria are one of the most common causes of pneumonia. Besides pneumonia, pneumococcal bacteria can also cause:  Ear infections  Sinus infections  Meningitis (infection of the tissue covering the brain and spinal cord)  Bacteremia (bloodstream infection) Anyone can get pneumococcal disease, but children under 50 years of age, people with certain medical conditions, adults 19 years or older, and cigarette smokers are at the highest risk. Most pneumococcal infections are mild. However, some can result in long-term problems, such as brain damage or hearing loss. Meningitis, bacteremia, and pneumonia caused by pneumococcal disease can be fatal. 2. PPSV23 PPSV23 protects against 23 types of bacteria that cause pneumococcal disease. PPSV23 is recommended for:  All adults 33 years or older,  Anyone 2 years or older with certain medical conditions that can lead to an increased risk for pneumococcal disease. Most people need only one dose of PPSV23. A second dose of PPSV23, and another type of pneumococcal vaccine called PCV13, are recommended for certain high-risk groups. Your health care provider can give you more information. People 65 years or older should get a dose of PPSV23 even if they have already gotten one or more doses of the vaccine before they turned 57. 3. Talk with your health care provider Tell your vaccine provider if the person getting the vaccine:  Has had an allergic reaction after a previous dose  of PPSV23, or has any severe, life-threatening allergies. In some cases, your health care provider may decide to postpone PPSV23 vaccination to a future visit. People with minor illnesses, such as a cold, may be vaccinated. People who are moderately or severely ill should usually wait until they recover before getting PPSV23. Your health care provider can give you more information. 4. Risks of a vaccine reaction  Redness or pain where the shot is given, feeling tired, fever, or muscle aches can happen after PPSV23. People sometimes faint after medical procedures, including vaccination. Tell your provider if you feel dizzy or have vision changes or ringing in the ears. As with any medicine, there is a very remote chance of a vaccine causing a severe allergic reaction, other serious injury, or death. 5. What if there is a serious problem? An allergic reaction could occur after the vaccinated person leaves the clinic. If you see signs of a severe allergic reaction (hives, swelling of the face and throat, difficulty breathing, a fast heartbeat, dizziness, or weakness), call 9-1-1 and get the person to the nearest hospital. For other signs that concern you, call your health care provider. Adverse reactions should be reported to the Vaccine Adverse Event Reporting System (VAERS). Your health care provider will usually file this report, or you can do it yourself. Visit the VAERS website at www.vaers.SamedayNews.es or call 928-381-2644. VAERS is only for reporting reactions, and VAERS staff do not give medical advice. 6. How can I learn more?  Ask your health care provider.  Call your local or state health department.  Contact the Centers for Disease Control and Prevention (CDC): ? Call (250)275-9293 (1-800-CDC-INFO) or ? Visit CDC's website at http://hunter.com/  CDC Vaccine Information Statement PPSV23 Vaccine (12/10/2017) This information is not intended to replace advice given to you by your health  care provider. Make sure you discuss any questions you have with your health care provider. Document Revised: 05/19/2018 Document Reviewed: 09/09/2017 Elsevier Patient Education  [place].

## 2019-02-25 LAB — COMPREHENSIVE METABOLIC PANEL
ALT: 20 IU/L (ref 0–44)
AST: 16 IU/L (ref 0–40)
Albumin/Globulin Ratio: 2 (ref 1.2–2.2)
Albumin: 4.5 g/dL (ref 3.8–4.8)
Alkaline Phosphatase: 70 IU/L (ref 39–117)
BUN/Creatinine Ratio: 17 (ref 10–24)
BUN: 15 mg/dL (ref 8–27)
Bilirubin Total: 0.6 mg/dL (ref 0.0–1.2)
CO2: 26 mmol/L (ref 20–29)
Calcium: 9.9 mg/dL (ref 8.6–10.2)
Chloride: 101 mmol/L (ref 96–106)
Creatinine, Ser: 0.9 mg/dL (ref 0.76–1.27)
GFR calc Af Amer: 103 mL/min/{1.73_m2} (ref >59)
GFR calc non Af Amer: 89 mL/min/{1.73_m2} (ref >59)
Globulin, Total: 2.2 g/dL (ref 1.5–4.5)
Glucose: 133 mg/dL — ABNORMAL HIGH (ref 65–99)
Potassium: 4 mmol/L (ref 3.5–5.2)
Sodium: 140 mmol/L (ref 134–144)
Total Protein: 6.7 g/dL (ref 6.0–8.5)

## 2019-02-25 LAB — HEMOGLOBIN A1C
Est. average glucose Bld gHb Est-mCnc: 237 mg/dL
Hgb A1c MFr Bld: 9.9 % — ABNORMAL HIGH (ref 4.8–5.6)

## 2019-02-26 ENCOUNTER — Other Ambulatory Visit: Payer: Self-pay | Admitting: Internal Medicine

## 2019-02-26 DIAGNOSIS — E118 Type 2 diabetes mellitus with unspecified complications: Secondary | ICD-10-CM

## 2019-02-26 MED ORDER — GLIMEPIRIDE 2 MG PO TABS: 2.0000 mg | ORAL_TABLET | Freq: Every day | ORAL | 1 refills | Status: DC

## 2019-03-03 LAB — HM DIABETES EYE EXAM

## 2019-03-08 ENCOUNTER — Encounter: Payer: Self-pay | Admitting: Internal Medicine

## 2019-04-19 ENCOUNTER — Other Ambulatory Visit: Payer: Self-pay | Admitting: Radiology

## 2019-04-19 DIAGNOSIS — N138 Other obstructive and reflux uropathy: Secondary | ICD-10-CM

## 2019-04-19 DIAGNOSIS — Z8042 Family history of malignant neoplasm of prostate: Secondary | ICD-10-CM

## 2019-04-19 DIAGNOSIS — N401 Enlarged prostate with lower urinary tract symptoms: Secondary | ICD-10-CM

## 2019-04-21 ENCOUNTER — Other Ambulatory Visit: Payer: Medicare Other

## 2019-04-26 ENCOUNTER — Ambulatory Visit: Payer: 59 | Admitting: Urology

## 2019-04-27 ENCOUNTER — Other Ambulatory Visit
Admission: RE | Admit: 2019-04-27 | Discharge: 2019-04-27 | Disposition: A | Discharge status: Home / Self Care | Payer: Medicare Other | Attending: Urology | Admitting: Urology

## 2019-04-27 ENCOUNTER — Other Ambulatory Visit: Payer: Self-pay

## 2019-04-27 DIAGNOSIS — N138 Other obstructive and reflux uropathy: Secondary | ICD-10-CM | POA: Insufficient documentation

## 2019-04-27 DIAGNOSIS — N401 Enlarged prostate with lower urinary tract symptoms: Secondary | ICD-10-CM | POA: Diagnosis present

## 2019-04-27 DIAGNOSIS — Z8042 Family history of malignant neoplasm of prostate: Secondary | ICD-10-CM | POA: Diagnosis not present

## 2019-04-27 LAB — PSA: Prostatic Specific Antigen: 2.68 ng/mL (ref 0.00–4.00)

## 2019-04-27 NOTE — Progress Notes (Signed)
04/29/2019 10:56 AM   Calvin Sanders [birth year] [id number]  Referring provider: Glean Hess, MD [address] [place],  [place] [address]  Chief Complaint  Patient presents with  . Benign Prostatic Hypertrophy    HPI: Calvin Sanders is a 64 year old Caucasian male with a history of ED and BPH with LUTS who presents today for his yearly follow up. He is also here for an IPSS, SHIM, PSA, and exam.    BPH with LU TS His IPSS score today is 1, which is no lower urinary tract symptomatology. He is delighted with his quality life due to his urinary symptoms. His previous IPSS score was 0/0.   He has not complaints at this time.  He denies any dysuria, hematuria or suprapubic pain.   His has not had any prostate surgeries or biopsies.     He also denies any recent fevers, chills, nausea or vomiting.  Brother with prostate cancer and he is currently doing fine. Son had testicular cancer when he was 40, and he is doing fine.  IPSS    Row Name 04/29/19 1000         International Prostate Symptom Score   How often have you had the sensation of not emptying your bladder?  Not at All     How often have you had to urinate less than every two hours?  Not at All     How often have you found you stopped and started again several times when you urinated?  Not at All     How often have you found it difficult to postpone urination?  Less than 1 in 5 times     How often have you had a weak urinary stream?  Not at All     How often have you had to strain to start urination?  Not at All     How many times did you typically get up at night to urinate?  None     Total IPSS Score  1       Quality of Life due to urinary symptoms   If you were to spend the rest of your life with your urinary condition just the way it is now how would you feel about that?  Delighted        Score:  1-7 Mild 8-19 Moderate 20-35 Severe  Erectile dysfunction His SHIM score is 19, which is mild to  moderate ED.   His previous SHIM score was 15.  He has been having difficulty with erections for several years.   His major complaint is maintaining.  His libido is good.   His risk factors for ED are smoking, DM, BPH, alcohol abuse and HTN.  He denies any painful erections or curvatures with his erections. Pt is doing fine with Viagra and denies spontaneous erections.        SHIM    Row Name 04/29/19 1008         SHIM: Over the last 6 months:   How do you rate your confidence that you could get and keep an erection?  Moderate     When you had erections with sexual stimulation, how often were your erections hard enough for penetration (entering your partner)?  Most Times (much more than half the time)     During sexual intercourse, how often were you able to maintain your erection after you had penetrated (entered) your partner?  Most Times (much more than half the time)  During sexual intercourse, how difficult was it to maintain your erection to completion of intercourse?  Slightly Difficult     When you attempted sexual intercourse, how often was it satisfactory for you?  Most Times (much more than half the time)       SHIM Total Score   SHIM  19        Score: 1-7 Severe ED 8-11 Moderate ED 12-16 Mild-Moderate ED 17-21 Mild ED 22-25 No ED   PMH: Past Medical History:  Diagnosis Date  . Alcohol dependence, daily use ([place]) 08/05/2014   Previously 6-8 beers per day, cut back in 2020 to 2 per day; in 09/2018 he is only drinking 2 beers on Sat and Sun  . Diabetes mellitus without complication ([place])   . Hypertension     Surgical History: Past Surgical History:  Procedure Laterality Date  . NO PAST SURGERIES      Home Medications:  Allergies as of 04/29/2019   No Known Allergies     Medication List       Accurate as of April 29, 2019 10:56 AM. If you have any questions, ask your nurse or doctor.        acetaminophen 500 MG tablet Commonly known as: TYLENOL Take 2  tablets (1,000 mg total) by mouth every 8 (eight) hours as needed.   atorvastatin 40 MG tablet Commonly known as: LIPITOR Take 1 tablet (40 mg total) by mouth daily.   B-12 500 MCG Tabs Take 1 tablet by mouth daily.   citalopram 20 MG tablet Commonly known as: CELEXA Take 1 tablet (20 mg total) by mouth daily.   dapagliflozin propanediol 10 MG Tabs tablet Commonly known as: FARXIGA Take 10 mg by mouth daily.   glimepiride 2 MG tablet Commonly known as: AMARYL Take 1 tablet (2 mg total) by mouth daily before breakfast.   glucose blood test strip One Touch Zero Flex glucose test strips, use as directed   losartan-hydrochlorothiazide 100-25 MG tablet Commonly known as: HYZAAR Take 1 tablet by mouth daily.   metFORMIN 500 MG tablet Commonly known as: GLUCOPHAGE Take 3 tablets (1,500 mg total) by mouth daily. Patient takes 2 tabs in AM and 1 in PM   ONE TOUCH LANCETS Misc 1 Units by Does not apply route as directed.   sildenafil 100 MG tablet Commonly known as: VIAGRA Take 1 tablet (100 mg total) by mouth daily as needed for erectile dysfunction. Take two hours prior to intercourse on an empty stomach   Vitamin D3 50 MCG (2000 UT) Tabs Take 1 capsule by mouth.       Allergies: No Known Allergies  Family History: Family History  Problem Relation Age of Onset  . Diabetes Mother   . Cancer Father        bone  . Heart disease Brother   . Prostate cancer Brother   . Kidney cancer Neg Hx   . Kidney disease Neg Hx   . Bladder Cancer Neg Hx     Social History:  reports that he quit smoking about 2 months ago. His smoking use included cigarettes. He started smoking about 46 years ago. He has a 22.50 pack-year smoking history. He has never used smokeless tobacco. He reports current alcohol use of about 4.0 standard drinks of alcohol per week. He reports that he does not use drugs.  ROS: For pertinent review of systems please refer to history of present  illness  Physical Exam: BP (!) 146/76   Pulse  67   Ht 5\' 8"  (1.727 m)   Wt 165 lb (74.8 kg)   BMI 25.09 kg/m   Constitutional:  Well nourished. Alert and oriented, No acute distress. HEENT: [place] AT, Trachea midline, no masses. Mask in place Cardiovascular: No clubbing, cyanosis, or edema. Respiratory: Normal respiratory effort, no increased work of breathing. GI: Abdomen is soft, non tender, non distended, no abdominal masses.   GU: No CVA tenderness.  No bladder fullness or masses.  Patient with circumcised phallus.  Urethral meatus is patent.  No penile discharge. No penile lesions or rashes. Scrotum without lesions, cysts, rashes and/or edema. Angiokeratomas on the scrotum. Testicles are located scrotally bilaterally. No masses are appreciated in the testicles. Left and right epididymis are normal. Rectal: Patient with  normal sphincter tone. Anus and perineum without scarring or rashes. No rectal masses are appreciated. Prostate is approximately 50 grams, and could only palpate the apex. No nodules are appreciated. Seminal vesicles were not palpated. Skin: No rashes, bruises or suspicious lesions. Lymph: No inguinal adenopathy. Neurologic: Grossly intact, no focal deficits, moving all 4 extremities. Psychiatric: Normal mood and affect.   Laboratory Data: Lab Results  Component Value Date   CREATININE 0.90 02/24/2019   PSA History  1.5 ng/mL on 07/16/2013  1.7 ng/mL on 09/06/2014  1.7 ng/mL on 09/21/2014  2.2 ng/mL on 09/21/2015  2.39 ng/mL on 09/20/2016  2.5 ng/mL on 11/04/2017  2.6 ng/mL on 05/05/2018  2.68 ng/mL on 04/29/2019  Lab Results  Component Value Date   HGBA1C 9.9 (H) 02/24/2019   I have reviewed the labs  Assessment & Plan:    1. BPH with LUTS  - IPSS score is 1, it is stable  - Continue conservative management, avoiding bladder irritants and timed voiding's  - RTC in 12 months for IPSS, PSA and exam    2. Erectile dysfunction  - SHIM score is 19, it  is worsening however pt is not bothered  - Continue Viagra, meds refilled  - RTC in 12 months for repeat SHIM score and exam  3. Family history of PCa:   Patient's brother with a h/o PCa.     Return in about 1 year (around 04/28/2020) for IPSS, SHIM, PSA and exam.  Zara Council, [facility]  [place] [address] [place], [place] [address] [phone]  I, Stacie Glaze, am acting as a Education administrator for [employer], PA-C.  I have reviewed the above documentation for accuracy and completeness, and I agree with the above.    Zara Council, PA-C

## 2019-04-29 ENCOUNTER — Other Ambulatory Visit: Payer: Self-pay | Admitting: Family Medicine

## 2019-04-29 ENCOUNTER — Ambulatory Visit (INDEPENDENT_AMBULATORY_CARE_PROVIDER_SITE_OTHER): Payer: 59 | Admitting: Urology

## 2019-04-29 ENCOUNTER — Encounter: Payer: Self-pay | Admitting: Urology

## 2019-04-29 ENCOUNTER — Other Ambulatory Visit: Payer: Self-pay

## 2019-04-29 VITALS — BP 146/76 | HR 67 | Ht 68.0 in | Wt 165.0 lb

## 2019-04-29 DIAGNOSIS — N138 Other obstructive and reflux uropathy: Secondary | ICD-10-CM | POA: Diagnosis not present

## 2019-04-29 DIAGNOSIS — Z8042 Family history of malignant neoplasm of prostate: Secondary | ICD-10-CM | POA: Diagnosis not present

## 2019-04-29 DIAGNOSIS — N401 Enlarged prostate with lower urinary tract symptoms: Secondary | ICD-10-CM

## 2019-04-29 DIAGNOSIS — N529 Male erectile dysfunction, unspecified: Secondary | ICD-10-CM | POA: Diagnosis not present

## 2019-05-06 ENCOUNTER — Encounter: Payer: 59 | Admitting: Internal Medicine

## 2019-05-19 ENCOUNTER — Encounter: Payer: 59 | Admitting: Internal Medicine

## 2019-06-16 ENCOUNTER — Other Ambulatory Visit: Payer: Self-pay

## 2019-06-16 ENCOUNTER — Encounter: Payer: Self-pay | Admitting: Internal Medicine

## 2019-06-16 ENCOUNTER — Ambulatory Visit (INDEPENDENT_AMBULATORY_CARE_PROVIDER_SITE_OTHER): Payer: 59 | Admitting: Internal Medicine

## 2019-06-16 ENCOUNTER — Encounter: Payer: 59 | Admitting: Internal Medicine

## 2019-06-16 ENCOUNTER — Other Ambulatory Visit: Payer: Self-pay | Admitting: Internal Medicine

## 2019-06-16 VITALS — BP 112/74 | HR 65 | Temp 98.2°F | Ht 68.0 in | Wt 171.0 lb

## 2019-06-16 DIAGNOSIS — F331 Major depressive disorder, recurrent, moderate: Secondary | ICD-10-CM

## 2019-06-16 DIAGNOSIS — E1169 Type 2 diabetes mellitus with other specified complication: Secondary | ICD-10-CM

## 2019-06-16 DIAGNOSIS — E785 Hyperlipidemia, unspecified: Secondary | ICD-10-CM

## 2019-06-16 DIAGNOSIS — B351 Tinea unguium: Secondary | ICD-10-CM

## 2019-06-16 DIAGNOSIS — I10 Essential (primary) hypertension: Secondary | ICD-10-CM | POA: Diagnosis not present

## 2019-06-16 DIAGNOSIS — E118 Type 2 diabetes mellitus with unspecified complications: Secondary | ICD-10-CM | POA: Diagnosis not present

## 2019-06-16 DIAGNOSIS — E538 Deficiency of other specified B group vitamins: Secondary | ICD-10-CM

## 2019-06-16 DIAGNOSIS — Z0000 Encounter for general adult medical examination without abnormal findings: Secondary | ICD-10-CM | POA: Diagnosis not present

## 2019-06-16 DIAGNOSIS — N401 Enlarged prostate with lower urinary tract symptoms: Secondary | ICD-10-CM

## 2019-06-16 DIAGNOSIS — G3184 Mild cognitive impairment, so stated: Secondary | ICD-10-CM

## 2019-06-16 DIAGNOSIS — N138 Other obstructive and reflux uropathy: Secondary | ICD-10-CM

## 2019-06-16 DIAGNOSIS — Z789 Other specified health status: Secondary | ICD-10-CM

## 2019-06-16 DIAGNOSIS — E559 Vitamin D deficiency, unspecified: Secondary | ICD-10-CM

## 2019-06-16 LAB — POCT URINALYSIS DIPSTICK
Bilirubin, UA: NEGATIVE
Blood, UA: NEGATIVE
Glucose, UA: POSITIVE — AB
Ketones, UA: NEGATIVE
Leukocytes, UA: NEGATIVE
Nitrite, UA: NEGATIVE
Protein, UA: NEGATIVE
Spec Grav, UA: <=1.005 — AB (ref 1.010–1.025)
Urobilinogen, UA: 0.2 E.U./dL
pH, UA: 5 (ref 5.0–8.0)

## 2019-06-16 NOTE — Progress Notes (Signed)
Date:  06/16/2019   Name:  Calvin Sanders   DOB:  1955-12-06   MRN:  [id number]   Chief Complaint: Annual Exam, Diabetes, and Toe Pain (Right Great toe- tingling. Light yellow discoloration to toenail. ) Calvin Sanders is a 64 y.o. male who presents today for his Complete Annual Exam. He feels fairly well. He reports exercising regularly. He reports he is sleeping fairly well.   Colonoscopy  06/2011 Immunization History  Administered Date(s) Administered  . Influenza,inj,Quad PF,6+ Mos 11/26/2018  . Pneumococcal Conjugate-13 09/16/2016  . Pneumococcal Polysaccharide-23 02/24/2019  . Tdap 09/16/2016    Hypertension The problem is controlled. Pertinent negatives include no chest pain, headaches, palpitations or shortness of breath. Past treatments include ACE inhibitors and diuretics.  Diabetes He presents for his follow-up diabetic visit. He has type 2 diabetes mellitus. Pertinent negatives for hypoglycemia include no dizziness or headaches. Pertinent negatives for diabetes include no chest pain and no fatigue. Current diabetic treatments: glimepiride, metformin, farxiga. He is compliant with treatment all of the time. An ACE inhibitor/angiotensin II receptor blocker is being taken.  Hyperlipidemia The problem is controlled. Pertinent negatives include no chest pain, myalgias or shortness of breath. Current antihyperlipidemic treatment includes statins. The current treatment provides significant improvement of lipids.  Depression        This is a chronic problem.The problem is unchanged.  Associated symptoms include no fatigue, no appetite change, no myalgias and no headaches.  Past treatments include SSRIs - Selective serotonin reuptake inhibitors.  Compliance with treatment is good.  Previous treatment provided significant relief. 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  He is still highly functioning, pleasant and interactive.    Lab Results   Component Value Date   CREATININE 0.90 02/24/2019   BUN 15 02/24/2019   NA 140 02/24/2019   K 4.0 02/24/2019   CL 101 02/24/2019   CO2 26 02/24/2019   Lab Results  Component Value Date   CHOL 148 05/05/2018   HDL 46 05/05/2018   LDLCALC 81 05/05/2018   TRIG 104 05/05/2018   CHOLHDL 3.2 05/05/2018   Lab Results  Component Value Date   TSH 1.160 03/19/2017   Lab Results  Component Value Date   HGBA1C 9.9 (H) 02/24/2019   Lab Results  Component Value Date   WBC 7.1 05/05/2018   HGB 15.5 05/05/2018   HCT 43.1 05/05/2018   MCV 95 05/05/2018   PLT 230 05/05/2018   Lab Results  Component Value Date   ALT 20 02/24/2019   AST 16 02/24/2019   ALKPHOS 70 02/24/2019   BILITOT 0.6 02/24/2019   Lab Results  Component Value Date   PSA1 2.6 05/05/2018   PSA1 2.5 11/04/2017   PSA1 2.2 09/21/2015       Review of Systems  Constitutional: Negative for appetite change, chills, diaphoresis, fatigue and unexpected weight change.  HENT: Negative for hearing loss, tinnitus, trouble swallowing and voice change.   Eyes: Negative for visual disturbance.  Respiratory: Negative for choking, shortness of breath and wheezing.   Cardiovascular: Negative for chest pain, palpitations and leg swelling.  Gastrointestinal: Negative for abdominal pain, blood in stool, constipation and diarrhea.  Genitourinary: Negative for difficulty urinating, dysuria and frequency.  Musculoskeletal: Negative for arthralgias, back pain and myalgias.  Skin: Negative for color change and rash.       Right great toe nail issue  Neurological: Negative for dizziness, syncope and headaches.  Hematological: Negative for adenopathy.  Psychiatric/Behavioral: Positive for depression. Negative for dysphoric mood and sleep disturbance.    Patient Active Problem List   Diagnosis Date Noted  . MCI (mild cognitive impairment) 03/19/2017  . Major depressive disorder, recurrent episode, moderate ([place]) 03/19/2017  .  B12 deficiency 05/22/2016  . Vitamin D deficiency 02/08/2015  . 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 Family history of prostate cancer 09/06/2014  . DM (diabetes mellitus), type 2 with complications ([place]) [id number]  . Hyperlipidemia associated with type 2 diabetes mellitus ([place]) 08/05/2014  . Tobacco use disorder, severe, in early remission 08/05/2014  . Colon polyps 05/14/2014  . Chronic prostatitis 05/14/2014  . Essential hypertension 05/14/2014  . BPH (benign prostatic hyperplasia) 05/14/2014    No Known Allergies  Past Surgical History:  Procedure Laterality Date  . NO PAST SURGERIES      Social History   Tobacco Use  . Smoking status: Former Smoker    Packs/day: 0.50    Years: 45.00    Pack years: 22.50    Types: Cigarettes    Start date: 02/11/1973    Quit date: 02/07/2019    Years since quitting: 0.3  . Smokeless tobacco: Never Used  Substance Use Topics  . Alcohol use: Yes    Alcohol/week: 14.0 standard drinks    Types: 14 Cans of beer per week    Comment: back up to 1-2 beers per day  . Drug use: No     Medication list has been reviewed and updated.  Current Meds  Medication Sig  . acetaminophen (TYLENOL) 500 MG tablet Take 2 tablets (1,000 mg total) by mouth every 8 (eight) hours as needed.  [name] atorvastatin (LIPITOR) 40 MG tablet Take 1 tablet (40 mg total) by mouth daily.  . Cholecalciferol (VITAMIN D3) 2000 units TABS Take 1 capsule by mouth.  . citalopram (CELEXA) 20 MG tablet Take 1 tablet (20 mg total) by mouth daily.  . Cyanocobalamin (B-12) 500 MCG TABS Take 1 tablet by mouth daily.  . dapagliflozin propanediol (FARXIGA) 10 MG TABS tablet Take 10 mg by mouth daily.  [name] glimepiride (AMARYL) 2 MG tablet Take 1 tablet (2 mg total) by mouth daily before breakfast.  . glucose blood test strip One Touch Zero Flex glucose test strips, use as directed  . losartan-hydrochlorothiazide (HYZAAR) 100-25 MG tablet Take 1 tablet by mouth daily.  .  metFORMIN (GLUCOPHAGE) 500 MG tablet Take 3 tablets (1,500 mg total) by mouth daily. Patient takes 2 tabs in AM and 1 in PM  . ONE TOUCH LANCETS MISC 1 Units by Does not apply route as directed.  . sildenafil (VIAGRA) 100 MG tablet Take 1 tablet (100 mg total) by mouth daily as needed for erectile dysfunction. Take two hours prior to intercourse on an empty stomach    PHQ 2/9 Scores 06/16/2019 02/24/2019 09/28/2018 05/05/2018  PHQ - 2 Score 0 0 0 0  PHQ- 9 Score 0 0 - -   GAD 7 : Generalized Anxiety Score 06/16/2019  Nervous, Anxious, on Edge 0  Control/stop worrying 0  Worry too much - different things 0  Trouble relaxing 0  Restless 0  Easily annoyed or irritable 0  Afraid - awful might happen 0  Total GAD 7 Score 0  Anxiety Difficulty Not difficult at all    6CIT Screen 06/16/2019 05/05/2018  What Year? 4 points 0 points  What month? 3 points 0 points  What time? 3 points 0 points  Count back from 20 0 points 0  points  Months in reverse 0 points 0 points  Repeat phrase 0 points 2 points  Total Score 10 2     BP Readings from Last 3 Encounters:  06/16/19 112/74  04/29/19 (!) 146/76  02/24/19 112/60    Physical Exam Vitals and nursing note reviewed.  Constitutional:      Appearance: Normal appearance. He is well-developed.  HENT:     Head: Normocephalic.     Right Ear: Tympanic membrane, ear canal and external ear normal.     Left Ear: Tympanic membrane, ear canal and external ear normal.     Nose: Nose normal.     Mouth/Throat:     Pharynx: Uvula midline.  Eyes:     Conjunctiva/sclera: Conjunctivae normal.     Pupils: Pupils are equal, round, and reactive to light.  Neck:     Thyroid: No thyromegaly.     Vascular: No carotid bruit.  Cardiovascular:     Rate and Rhythm: Normal rate and regular rhythm.     Heart sounds: Normal heart sounds.  Pulmonary:     Effort: Pulmonary effort is normal.     Breath sounds: Normal breath sounds. No wheezing.  Chest:     Breasts:         Right: No mass.        Left: No mass.  Abdominal:     General: Bowel sounds are normal.     Palpations: Abdomen is soft.     Tenderness: There is no abdominal tenderness.  Musculoskeletal:        General: Normal range of motion.     Cervical back: Normal range of motion and neck supple.  Lymphadenopathy:     Cervical: No cervical adenopathy.  Skin:    General: Skin is warm and dry.  Neurological:     Mental Status: He is alert and oriented to person, place, and time.     Deep Tendon Reflexes: Reflexes are normal and symmetric.  Psychiatric:        Speech: Speech normal.        Behavior: Behavior normal.        Thought Content: Thought content normal.        Judgment: Judgment normal.     Wt Readings from Last 3 Encounters:  06/16/19 171 lb (77.6 kg)  04/29/19 165 lb (74.8 kg)  02/24/19 168 lb (76.2 kg)    BP 112/74   Pulse 65   Temp 98.2 F (36.8 C) (Temporal)   Ht 5\' 8"  (1.727 m)   Wt 171 lb (77.6 kg)   SpO2 96%   BMI 26.00 kg/m   Assessment and Plan: 1. Annual physical exam Normal exam Continue healthy diet and normal weight  2. Essential hypertension Clinically stable exam with well controlled BP. Tolerating medications without side effects at this time. Pt to continue current regimen and low sodium diet; benefits of regular exercise as able discussed. - CBC with Differential/Platelet - Comprehensive metabolic panel - POCT urinalysis dipstick  3. Hyperlipidemia associated with type 2 diabetes mellitus ([place]) Tolerating statin medication without side effects at this time LDL is nearly at goal of < 70 on current dose Continue same therapy without change at this time. - Lipid panel  4. DM (diabetes mellitus), type 2 with complications ([place]) Clinically stable by exam and report without s/s of hypoglycemia. Glimepiride added last visit for elevated A1C - hopefully improved Pt does not do home monitoring DM complicated by HTN and lipids. Tolerating  medications  well without side effects or other concerns. - Hemoglobin A1c  5. MCI (mild cognitive impairment) Appears stable from our interaction today 6CIT shows some declines in orientation to date and time.  6. Major depressive disorder, recurrent episode, moderate ([place]) Doing well on current therapy with SSRI Will continue same dose of 20 mg Celexa for now.  7. B12 deficiency On oral supplementation - will check to verify that levels are normal - Vitamin B12  8. Vitamin D deficiency - VITAMIN D 25 Hydroxy (Vit-D Deficiency, Fractures)  9. BPH with obstruction/lower urinary tract symptoms Being followed by Urology Most recent PSA last month was  2.68  10. Onychomycosis Pt is completely asymptomatic with regards to the nail He is advised that no treatment is needed  11. Daily consumption of alcohol We discussed cutting back to consume less than 2 beers per day - This is still a significant improvement from previous consumption levels   Partially dictated using Editor, commissioning. Any errors are unintentional.  Halina Maidens, MD [facility]  06/16/2019

## 2019-06-17 LAB — LIPID PANEL
Chol/HDL Ratio: 3.1 ratio (ref 0.0–5.0)
Cholesterol, Total: 147 mg/dL (ref 100–199)
HDL: 47 mg/dL (ref >39)
LDL Chol Calc (NIH): 81 mg/dL (ref 0–99)
Triglycerides: 100 mg/dL (ref 0–149)
VLDL Cholesterol Cal: 19 mg/dL (ref 5–40)

## 2019-06-17 LAB — COMPREHENSIVE METABOLIC PANEL
ALT: 17 IU/L (ref 0–44)
AST: 19 IU/L (ref 0–40)
Albumin/Globulin Ratio: 2.2 (ref 1.2–2.2)
Albumin: 4.9 g/dL — ABNORMAL HIGH (ref 3.8–4.8)
Alkaline Phosphatase: 80 IU/L (ref 39–117)
BUN/Creatinine Ratio: 10 (ref 10–24)
BUN: 12 mg/dL (ref 8–27)
Bilirubin Total: 0.8 mg/dL (ref 0.0–1.2)
CO2: 21 mmol/L (ref 20–29)
Calcium: 9.9 mg/dL (ref 8.6–10.2)
Chloride: 95 mmol/L — ABNORMAL LOW (ref 96–106)
Creatinine, Ser: 1.21 mg/dL (ref 0.76–1.27)
GFR calc Af Amer: 72 mL/min/{1.73_m2} (ref >59)
GFR calc non Af Amer: 62 mL/min/{1.73_m2} (ref >59)
Globulin, Total: 2.2 g/dL (ref 1.5–4.5)
Glucose: 117 mg/dL — ABNORMAL HIGH (ref 65–99)
Potassium: 4.1 mmol/L (ref 3.5–5.2)
Sodium: 135 mmol/L (ref 134–144)
Total Protein: 7.1 g/dL (ref 6.0–8.5)

## 2019-06-17 LAB — CBC WITH DIFFERENTIAL/PLATELET
Basophils Absolute: 0 10*3/uL (ref 0.0–0.2)
Basos: 0 %
EOS (ABSOLUTE): 0.1 10*3/uL (ref 0.0–0.4)
Eos: 2 %
Hematocrit: 45.9 % (ref 37.5–51.0)
Hemoglobin: 16.2 g/dL (ref 13.0–17.7)
Immature Grans (Abs): 0 10*3/uL (ref 0.0–0.1)
Immature Granulocytes: 0 %
Lymphocytes Absolute: 1.6 10*3/uL (ref 0.7–3.1)
Lymphs: 20 %
MCH: 34.2 pg — ABNORMAL HIGH (ref 26.6–33.0)
MCHC: 35.3 g/dL (ref 31.5–35.7)
MCV: 97 fL (ref 79–97)
Monocytes Absolute: 0.5 10*3/uL (ref 0.1–0.9)
Monocytes: 7 %
Neutrophils Absolute: 5.7 10*3/uL (ref 1.4–7.0)
Neutrophils: 71 %
Platelets: 198 10*3/uL (ref 150–450)
RBC: 4.74 x10E6/uL (ref 4.14–5.80)
RDW: 12.7 % (ref 11.6–15.4)
WBC: 8 10*3/uL (ref 3.4–10.8)

## 2019-06-17 LAB — HEMOGLOBIN A1C
Est. average glucose Bld gHb Est-mCnc: 197 mg/dL
Hgb A1c MFr Bld: 8.5 % — ABNORMAL HIGH (ref 4.8–5.6)

## 2019-06-17 LAB — VITAMIN B12: Vitamin B-12: 493 pg/mL (ref 232–1245)

## 2019-06-17 LAB — VITAMIN D 25 HYDROXY (VIT D DEFICIENCY, FRACTURES): Vit D, 25-Hydroxy: 67 ng/mL (ref 30.0–100.0)

## 2019-06-22 ENCOUNTER — Telehealth: Payer: Self-pay | Admitting: Internal Medicine

## 2019-06-22 NOTE — Telephone Encounter (Signed)
Pt. returned call re: lab results.  Advised of result note per Dr. Army Melia from 06/17/19.  Pt. Verb. Understanding.  Denied any other needs at this time.

## 2019-06-30 ENCOUNTER — Ambulatory Visit: Payer: Medicare Other

## 2019-08-18 ENCOUNTER — Other Ambulatory Visit: Payer: Self-pay | Admitting: Internal Medicine

## 2019-08-18 DIAGNOSIS — E118 Type 2 diabetes mellitus with unspecified complications: Secondary | ICD-10-CM

## 2019-08-18 NOTE — Telephone Encounter (Signed)
Requested Prescriptions  Pending Prescriptions Disp Refills  . glimepiride (AMARYL) 2 MG tablet [Pharmacy Med Name: GLIMEPIRIDE 2MG  TABLETS] 90 tablet 1    Sig: TAKE 1 TABLET(2 MG) BY MOUTH DAILY BEFORE BREAKFAST     Endocrinology:  Diabetes - Sulfonylureas Failed - 08/18/2019  3:37 AM      Failed - HBA1C is between 0 and 7.9 and within 180 days    Hgb A1c MFr Bld  Date Value Ref Range Status  06/16/2019 8.5 (H) 4.8 - 5.6 % Final    Comment:             Prediabetes: 5.7 - 6.4          Diabetes: >6.4          Glycemic control for adults with diabetes: <7.0          Passed - Valid encounter within last 6 months    Recent Outpatient Visits          2 months ago Annual physical exam   [facility] Glean Hess, MD   5 months ago DM (diabetes mellitus), type 2 with complications [facility])   Muse, MD   10 months ago DM (diabetes mellitus), type 2 with complications [facility])   [facility] Glean Hess, MD   1 year ago Annual physical exam   [facility] Glean Hess, MD   1 year ago Essential hypertension   [facility] Glean Hess, MD      Future Appointments            In 2 months Army Melia Jesse Sans, MD [facility], [place]   In 8 months McGowan, Gordan Payment [place]   In 10 months Army Melia, Jesse Sans, MD [facility], [facility]

## 2019-09-13 ENCOUNTER — Other Ambulatory Visit: Payer: Self-pay | Admitting: Internal Medicine

## 2019-09-13 DIAGNOSIS — F324 Major depressive disorder, single episode, in partial remission: Secondary | ICD-10-CM

## 2019-09-13 DIAGNOSIS — E1169 Type 2 diabetes mellitus with other specified complication: Secondary | ICD-10-CM

## 2019-09-13 DIAGNOSIS — E118 Type 2 diabetes mellitus with unspecified complications: Secondary | ICD-10-CM

## 2019-09-13 DIAGNOSIS — E785 Hyperlipidemia, unspecified: Secondary | ICD-10-CM

## 2019-09-13 NOTE — Telephone Encounter (Signed)
Requested Prescriptions  Pending Prescriptions Disp Refills   citalopram (CELEXA) 20 MG tablet [Pharmacy Med Name: CITALOPRAM 20MG TABLETS] 90 tablet 1    Sig: TAKE 1 TABLET(20 MG) BY MOUTH DAILY     Psychiatry:  Antidepressants - SSRI Passed - 09/13/2019  3:42 AM      Passed - Completed PHQ-2 or PHQ-9 in the last 360 days.      Passed - Valid encounter within last 6 months    Recent Outpatient Visits          2 months ago Annual physical exam   [facility] Glean Hess, MD   6 months ago DM (diabetes mellitus), type 2 with complications [facility])   [facility] Glean Hess, MD   11 months ago DM (diabetes mellitus), type 2 with complications [facility])   [facility] Glean Hess, MD   1 year ago Annual physical exam   [facility] Glean Hess, MD   1 year ago Essential hypertension   [place], MD      Future Appointments            In 1 month Army Melia Jesse Sans, MD [facility], [place]   In 7 months McGowan, Gordan Payment [facility]   In 9 months Army Melia, Jesse Sans, MD [facility]            atorvastatin (LIPITOR) 40 MG tablet [Pharmacy Med Name: ATORVASTATIN 40MG TABLETS] 90 tablet 3    Sig: TAKE 1 TABLET(40 MG) BY MOUTH DAILY     Cardiovascular:  Antilipid - Statins Failed - 09/13/2019  3:42 AM      Failed - LDL in normal range and within 360 days    LDL Chol Calc (NIH)  Date Value Ref Range Status  06/16/2019 81 0 - 99 mg/dL Final         Passed - Total Cholesterol in normal range and within 360 days    Cholesterol, Total  Date Value Ref Range Status  06/16/2019 147 100 - 199 mg/dL Final         Passed - HDL in normal range and within 360 days    HDL  Date Value Ref Range Status  06/16/2019 47 >39 mg/dL Final         Passed - Triglycerides in normal range and within 360 days    Triglycerides  Date Value Ref Range Status  06/16/2019  100 0 - 149 mg/dL Final         Passed - Patient is not pregnant      Passed - Valid encounter within last 12 months    Recent Outpatient Visits          2 months ago Annual physical exam   [facility] Glean Hess, MD   6 months ago DM (diabetes mellitus), type 2 with complications [facility])   [facility] Glean Hess, MD   11 months ago DM (diabetes mellitus), type 2 with complications [facility])   [place], Laura H, MD   1 year ago Annual physical exam   [facility] Glean Hess, MD   1 year ago Essential hypertension   [facility] Glean Hess, MD      Future Appointments            In 1 month Army Melia, Jesse Sans, MD [facility]  In 7 months McGowan, Hunt Oris, PA-C [place]   In 9 months Army Melia, Jesse Sans, MD [facility], [facility]            metFORMIN (GLUCOPHAGE) 500 MG tablet [Pharmacy Med Name: METFORMIN 500MG TABLETS] 270 tablet 1    Sig: TAKE 2 TABLETS IN THE MORNING AND 1 TABLET IN THE EVENING     Endocrinology:  Diabetes - Biguanides Failed - 09/13/2019  3:42 AM      Failed - HBA1C is between 0 and 7.9 and within 180 days    Hgb A1c MFr Bld  Date Value Ref Range Status  06/16/2019 8.5 (H) 4.8 - 5.6 % Final    Comment:             Prediabetes: 5.7 - 6.4          Diabetes: >6.4          Glycemic control for adults with diabetes: <7.0          Passed - Cr in normal range and within 360 days    Creatinine, Ser  Date Value Ref Range Status  06/16/2019 1.21 0.76 - 1.27 mg/dL Final         Passed - eGFR in normal range and within 360 days    GFR calc Af Amer  Date Value Ref Range Status  06/16/2019 72 >59 mL/min/1.73 Final    Comment:    **[facility] currently reports eGFR in compliance with the current**   recommendations of the [employer]. [facility] will   update reporting as new guidelines are published from the NKF-ASN    Task force.    GFR calc non Af Amer  Date Value Ref Range Status  06/16/2019 62 >59 mL/min/1.73 Final         Passed - Valid encounter within last 6 months    Recent Outpatient Visits          2 months ago Annual physical exam   [facility] Glean Hess, MD   6 months ago DM (diabetes mellitus), type 2 with complications [facility])   [facility] Glean Hess, MD   11 months ago DM (diabetes mellitus), type 2 with complications [facility])   [facility] Glean [facility], MD   1 year ago Annual physical exam   [facility] Glean Hess, MD   1 year ago Essential hypertension   [facility] Glean Hess, MD      Future Appointments            In 1 month Army Melia Jesse Sans, MD [facility], [place]   In 7 months McGowan, Gordan Payment [facility]   In 9 months Army Melia, Jesse Sans, MD [facility], [facility]

## 2019-09-23 ENCOUNTER — Other Ambulatory Visit: Payer: Self-pay | Admitting: Internal Medicine

## 2019-09-23 DIAGNOSIS — E118 Type 2 diabetes mellitus with unspecified complications: Secondary | ICD-10-CM

## 2019-10-06 ENCOUNTER — Other Ambulatory Visit: Payer: Self-pay | Admitting: Internal Medicine

## 2019-10-06 DIAGNOSIS — I10 Essential (primary) hypertension: Secondary | ICD-10-CM

## 2019-10-06 NOTE — Telephone Encounter (Signed)
Requested Prescriptions  Pending Prescriptions Disp Refills  . losartan-hydrochlorothiazide (HYZAAR) 100-25 MG tablet [Pharmacy Med Name: LOSARTAN/HCTZ 100/25MG  TABLETS] 90 tablet 0    Sig: TAKE 1 TABLET BY MOUTH DAILY     Cardiovascular: ARB + Diuretic Combos Passed - 10/06/2019  3:40 AM      Passed - K in normal range and within 180 days    Potassium  Date Value Ref Range Status  06/16/2019 4.1 3.5 - 5.2 mmol/L Final         Passed - Na in normal range and within 180 days    Sodium  Date Value Ref Range Status  06/16/2019 135 134 - 144 mmol/L Final         Passed - Cr in normal range and within 180 days    Creatinine, Ser  Date Value Ref Range Status  06/16/2019 1.21 0.76 - 1.27 mg/dL Final         Passed - Ca in normal range and within 180 days    Calcium  Date Value Ref Range Status  06/16/2019 9.9 8.6 - 10.2 mg/dL Final         Passed - Patient is not pregnant      Passed - Last BP in normal range    BP Readings from Last 1 Encounters:  06/16/19 112/74         Passed - Valid encounter within last 6 months    Recent Outpatient Visits          3 months ago Annual physical exam   [facility] Glean Hess, MD   7 months ago DM (diabetes mellitus), type 2 with complications [facility])   [place], Laura H, MD   1 year ago DM (diabetes mellitus), type 2 with complications [facility])   [facility] Glean Hess, MD   1 year ago Annual physical exam   [facility] Glean Hess, MD   1 year ago Essential hypertension   [facility] Glean Hess, MD      Future Appointments            In 2 weeks Army Melia Jesse Sans, MD [facility], [place]   In 6 months McGowan, Gordan Payment [facility]   In 8 months Army Melia, Jesse Sans, MD [facility]

## 2019-10-20 ENCOUNTER — Other Ambulatory Visit: Payer: Self-pay

## 2019-10-20 ENCOUNTER — Encounter: Payer: Self-pay | Admitting: Internal Medicine

## 2019-10-20 ENCOUNTER — Ambulatory Visit (INDEPENDENT_AMBULATORY_CARE_PROVIDER_SITE_OTHER): Payer: 59 | Admitting: Internal Medicine

## 2019-10-20 VITALS — BP 132/66 | HR 61 | Ht 68.0 in | Wt 176.0 lb

## 2019-10-20 DIAGNOSIS — Z23 Encounter for immunization: Secondary | ICD-10-CM

## 2019-10-20 DIAGNOSIS — E118 Type 2 diabetes mellitus with unspecified complications: Secondary | ICD-10-CM | POA: Diagnosis not present

## 2019-10-20 NOTE — Progress Notes (Signed)
Date:  10/20/2019   Name:  Calvin Sanders   DOB:  [birth year]   MRN:  [id number]   Chief Complaint: Diabetes  Diabetes He presents for his follow-up diabetic visit. He has type 2 diabetes mellitus. His disease course has been stable. There are no hypoglycemic associated symptoms. Pertinent negatives for hypoglycemia include no headaches or tremors. Pertinent negatives for diabetes include no chest pain, no fatigue, no polydipsia and no polyuria. Current diabetic treatment includes oral agent (triple therapy). He is compliant with treatment all of the time. His weight is stable. He is following a generally healthy diet. His home blood glucose trend is decreasing steadily. An ACE inhibitor/angiotensin II receptor blocker is being taken. Eye exam is current.    Lab Results  Component Value Date   CREATININE 1.21 06/16/2019   BUN 12 06/16/2019   NA 135 06/16/2019   K 4.1 06/16/2019   CL 95 (L) 06/16/2019   CO2 21 06/16/2019   Lab Results  Component Value Date   CHOL 147 06/16/2019   HDL 47 06/16/2019   LDLCALC 81 06/16/2019   TRIG 100 06/16/2019   CHOLHDL 3.1 06/16/2019   Lab Results  Component Value Date   TSH 1.160 03/19/2017   Lab Results  Component Value Date   HGBA1C 8.5 (H) 06/16/2019   Lab Results  Component Value Date   WBC 8.0 06/16/2019   HGB 16.2 06/16/2019   HCT 45.9 06/16/2019   MCV 97 06/16/2019   PLT 198 06/16/2019   Lab Results  Component Value Date   ALT 17 06/16/2019   AST 19 06/16/2019   ALKPHOS 80 06/16/2019   BILITOT 0.8 06/16/2019     Review of Systems  Constitutional: Negative for appetite change, fatigue and unexpected weight change.  Eyes: Negative for visual disturbance.  Respiratory: Negative for cough, shortness of breath and wheezing.   Cardiovascular: Negative for chest pain, palpitations and leg swelling.  Gastrointestinal: Negative for abdominal pain and blood in stool.  Endocrine: Negative for polydipsia and polyuria.    Genitourinary: Negative for dysuria and hematuria.  Skin: Negative for color change and rash.  Neurological: Negative for tremors, numbness and headaches.  Psychiatric/Behavioral: Negative for dysphoric mood.    Patient Active Problem List   Diagnosis Date Noted  . MCI (mild cognitive impairment) 03/19/2017  . Major depressive disorder, recurrent episode, moderate ([place]) 03/19/2017  . B12 deficiency 05/22/2016  . Vitamin D deficiency 02/08/2015  . 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 Family history of prostate cancer 09/06/2014  . DM (diabetes mellitus), type 2 with complications ([place]) 66/29/4765  . Hyperlipidemia associated with type 2 diabetes mellitus ([place]) 08/05/2014  . Tobacco use disorder, severe, in early remission 08/05/2014  . Colon polyps 05/14/2014  . Chronic prostatitis 05/14/2014  . Essential hypertension 05/14/2014  . BPH (benign prostatic hyperplasia) 05/14/2014    No Known Allergies  Past Surgical History:  Procedure Laterality Date  . NO PAST SURGERIES      Social History   Tobacco Use  . Smoking status: Former Smoker    Packs/day: 0.50    Years: 45.00    Pack years: 22.50    Types: Cigarettes    Start date: 02/11/1973    Quit date: 02/07/2019    Years since quitting: 0.6  . Smokeless tobacco: Never Used  Substance Use Topics  . Alcohol use: Yes    Alcohol/week: 14.0 standard drinks    Types: 14 Cans of beer per week  Comment: back up to 1-2 beers per day  . Drug use: No     Medication list has been reviewed and updated.  Current Meds  Medication Sig  . acetaminophen (TYLENOL) 500 MG tablet Take 2 tablets (1,000 mg total) by mouth every 8 (eight) hours as needed.  [name] atorvastatin (LIPITOR) 40 MG tablet TAKE 1 TABLET(40 MG) BY MOUTH DAILY  . Cholecalciferol (VITAMIN D3) 2000 units TABS Take 1 capsule by mouth.  . citalopram (CELEXA) 20 MG tablet TAKE 1 TABLET(20 MG) BY MOUTH DAILY  . Cyanocobalamin (B-12) 500 MCG TABS Take 1  tablet by mouth daily.  [name] FARXIGA 10 MG TABS tablet TAKE 1 TABLET BY MOUTH DAILY  . glimepiride (AMARYL) 2 MG tablet TAKE 1 TABLET(2 MG) BY MOUTH DAILY BEFORE BREAKFAST  . glucose blood test strip One Touch Zero Flex glucose test strips, use as directed  . losartan-hydrochlorothiazide (HYZAAR) 100-25 MG tablet TAKE 1 TABLET BY MOUTH DAILY  . metFORMIN (GLUCOPHAGE) 500 MG tablet TAKE 2 TABLETS IN THE MORNING AND 1 TABLET IN THE EVENING  . ONE TOUCH LANCETS MISC 1 Units by Does not apply route as directed.  . sildenafil (VIAGRA) 100 MG tablet Take 1 tablet (100 mg total) by mouth daily as needed for erectile dysfunction. Take two hours prior to intercourse on an empty stomach    PHQ 2/9 Scores 10/20/2019 06/16/2019 02/24/2019 09/28/2018  PHQ - 2 Score 0 0 0 0  PHQ- 9 Score 0 0 0 -    GAD 7 : Generalized Anxiety Score 10/20/2019 06/16/2019  Nervous, Anxious, on Edge 0 0  Control/stop worrying 0 0  Worry too much - different things 0 0  Trouble relaxing 0 0  Restless 0 0  Easily annoyed or irritable 0 0  Afraid - awful might happen 0 0  Total GAD 7 Score 0 0  Anxiety Difficulty Not difficult at all Not difficult at all    BP Readings from Last 3 Encounters:  10/20/19 132/66  06/16/19 112/74  04/29/19 (!) 146/76    Physical Exam Vitals and nursing note reviewed.  Constitutional:      General: He is not in acute distress.    Appearance: He is well-developed.  HENT:     Head: Normocephalic and atraumatic.  Cardiovascular:     Rate and Rhythm: Normal rate and regular rhythm.     Pulses: Normal pulses.     Heart sounds: No murmur heard.   Pulmonary:     Effort: Pulmonary effort is normal. No respiratory distress.     Breath sounds: No wheezing or rhonchi.  Musculoskeletal:        General: Normal range of motion.     Cervical back: Normal range of motion.     Right lower leg: No edema.     Left lower leg: No edema.  Skin:    General: Skin is warm and dry.     Findings: No rash.    Neurological:     Mental Status: He is alert and oriented to person, place, and time.  Psychiatric:        Behavior: Behavior normal.        Thought Content: Thought content normal.     Wt Readings from Last 3 Encounters:  10/20/19 176 lb (79.8 kg)  06/16/19 171 lb (77.6 kg)  04/29/19 165 lb (74.8 kg)    BP 132/66   Pulse 61   Ht 5\' 8"  (1.727 m)   Wt 176 lb (79.8 kg)  SpO2 96%   BMI 26.76 kg/m   Assessment and Plan: 1. DM (diabetes mellitus), type 2 with complications ([place]) Clinically stable by exam and report without s/s of hypoglycemia. DM complicated by HTN. Tolerating medications well without side effects or other concerns. He does not check his BS and has gained a bit of weight - hopefully A1C will continue to improve - Hemoglobin A1c  2. Need for immunization against influenza - Flu Vaccine QUAD High Dose(Fluad)   Partially dictated using Editor, commissioning. Any errors are unintentional.  Halina Maidens, MD [facility]  10/20/2019

## 2019-10-21 LAB — HEMOGLOBIN A1C
Est. average glucose Bld gHb Est-mCnc: 189 mg/dL
Hgb A1c MFr Bld: 8.2 % — ABNORMAL HIGH (ref 4.8–5.6)

## 2019-11-17 ENCOUNTER — Ambulatory Visit (INDEPENDENT_AMBULATORY_CARE_PROVIDER_SITE_OTHER): Payer: 59

## 2019-11-17 DIAGNOSIS — Z0000 Encounter for general adult medical examination without abnormal findings: Secondary | ICD-10-CM | POA: Diagnosis not present

## 2019-11-17 NOTE — Patient Instructions (Signed)
Mr. Calvin Sanders , Thank you for taking time to come for your Medicare Wellness Visit. I appreciate your ongoing commitment to your health goals. Please review the following plan we discussed and let me know if I can assist you in the future.   Screening recommendations/referrals: Colonoscopy: done 07/02/11. Repeat in 2023 Recommended yearly ophthalmology/optometry visit for glaucoma screening and checkup Recommended yearly dental visit for hygiene and checkup  Vaccinations: Influenza vaccine: done 10/20/19 Pneumococcal vaccine: done 02/24/19 Tdap vaccine: done 09/16/16 Shingles vaccine: Shingrix discussed. Please contact your pharmacy for coverage information.  Covid-19:  Done 06/30/19 7 07/28/19  Advanced directives: Advance directive discussed with you today. Even though you declined this today please call our office should you change your mind and we can give you the proper paperwork for you to fill out.  Conditions/risks identified: Recommend healthy eating and physical activity to lower A1c  Next appointment: Follow up in one year for your annual wellness visit.   Preventive Care 66 Years and Older, Male Preventive care refers to lifestyle choices and visits with your health care provider that can promote health and wellness. What does preventive care include?  A yearly physical exam. This is also called an annual well check.  Dental exams once or twice a year.  Routine eye exams. Ask your health care provider how often you should have your eyes checked.  Personal lifestyle choices, including:  Daily care of your teeth and gums.  Regular physical activity.  Eating a healthy diet.  Avoiding tobacco and drug use.  Limiting alcohol use.  Practicing safe sex.  Taking low doses of aspirin every day.  Taking vitamin and mineral supplements as recommended by your health care provider. What happens during an annual well check? The services and screenings done by your health care  provider during your annual well check will depend on your age, overall health, lifestyle risk factors, and family history of disease. Counseling  Your health care provider may ask you questions about your:  Alcohol use.  Tobacco use.  Drug use.  Emotional well-being.  Home and relationship well-being.  Sexual activity.  Eating habits.  History of falls.  Memory and ability to understand (cognition).  Work and work Statistician. Screening  You may have the following tests or measurements:  Height, weight, and BMI.  Blood pressure.  Lipid and cholesterol levels. These may be checked every 5 years, or more frequently if you are over 66 years old.  Skin check.  Lung cancer screening. You may have this screening every year starting at age 66 if you have a 30-pack-year history of smoking and currently smoke or have quit within the past 15 years.  Fecal occult blood test (FOBT) of the stool. You may have this test every year starting at age 66.  Flexible sigmoidoscopy or colonoscopy. You may have a sigmoidoscopy every 5 years or a colonoscopy every 10 years starting at age 48.  Prostate cancer screening. Recommendations will vary depending on your family history and other risks.  Hepatitis C blood test.  Hepatitis B blood test.  Sexually transmitted disease (STD) testing.  Diabetes screening. This is done by checking your blood sugar (glucose) after you have not eaten for a while (fasting). You may have this done every 1-3 years.  Abdominal aortic aneurysm (AAA) screening. You may need this if you are a current or former smoker.  Osteoporosis. You may be screened starting at age 66 if you are at high risk. Talk with your health care  provider about your test results, treatment options, and if necessary, the need for more tests. Vaccines  Your health care provider may recommend certain vaccines, such as:  Influenza vaccine. This is recommended every year.  Tetanus,  diphtheria, and acellular pertussis (Tdap, Td) vaccine. You may need a Td booster every 10 years.  Zoster vaccine. You may need this after age 66.  Pneumococcal 13-valent conjugate (PCV13) vaccine. One dose is recommended after age 66.  Pneumococcal polysaccharide (PPSV23) vaccine. One dose is recommended after age 66. Talk to your health care provider about which screenings and vaccines you need and how often you need them. This information is not intended to replace advice given to you by your health care provider. Make sure you discuss any questions you have with your health care provider. Document Released: 02/24/2015 Document Revised: 10/18/2015 Document Reviewed: 11/29/2014 Elsevier Interactive Patient Education  2017 [place] Prevention in the Home Falls can cause injuries. They can happen to people of all ages. There are many things you can do to make your home safe and to help prevent falls. What can I do on the outside of my home?  Regularly fix the edges of walkways and driveways and fix any cracks.  Remove anything that might make you trip as you walk through a door, such as a raised step or threshold.  Trim any bushes or trees on the path to your home.  Use bright outdoor lighting.  Clear any walking paths of anything that might make someone trip, such as rocks or tools.  Regularly check to see if handrails are loose or broken. Make sure that both sides of any steps have handrails.  Any raised decks and porches should have guardrails on the edges.  Have any leaves, snow, or ice cleared regularly.  Use sand or salt on walking paths during winter.  Clean up any spills in your garage right away. This includes oil or grease spills. What can I do in the bathroom?  Use night lights.  Install grab bars by the toilet and in the tub and shower. Do not use towel bars as grab bars.  Use non-skid mats or decals in the tub or shower.  If you need to sit down in  the shower, use a plastic, non-slip stool.  Keep the floor dry. Clean up any water that spills on the floor as soon as it happens.  Remove soap buildup in the tub or shower regularly.  Attach bath mats securely with double-sided non-slip rug tape.  Do not have throw rugs and other things on the floor that can make you trip. What can I do in the bedroom?  Use night lights.  Make sure that you have a light by your bed that is easy to reach.  Do not use any sheets or blankets that are too big for your bed. They should not hang down onto the floor.  Have a firm chair that has side arms. You can use this for support while you get dressed.  Do not have throw rugs and other things on the floor that can make you trip. What can I do in the kitchen?  Clean up any spills right away.  Avoid walking on wet floors.  Keep items that you use a lot in easy-to-reach places.  If you need to reach something above you, use a strong step stool that has a grab bar.  Keep electrical cords out of the way.  Do not use floor polish  or wax that makes floors slippery. If you must use wax, use non-skid floor wax.  Do not have throw rugs and other things on the floor that can make you trip. What can I do with my stairs?  Do not leave any items on the stairs.  Make sure that there are handrails on both sides of the stairs and use them. Fix handrails that are broken or loose. Make sure that handrails are as long as the stairways.  Check any carpeting to make sure that it is firmly attached to the stairs. Fix any carpet that is loose or worn.  Avoid having throw rugs at the top or bottom of the stairs. If you do have throw rugs, attach them to the floor with carpet tape.  Make sure that you have a light switch at the top of the stairs and the bottom of the stairs. If you do not have them, ask someone to add them for you. What else can I do to help prevent falls?  Wear shoes that:  Do not have high  heels.  Have rubber bottoms.  Are comfortable and fit you well.  Are closed at the toe. Do not wear sandals.  If you use a stepladder:  Make sure that it is fully opened. Do not climb a closed stepladder.  Make sure that both sides of the stepladder are locked into place.  Ask someone to hold it for you, if possible.  Clearly mark and make sure that you can see:  Any grab bars or handrails.  First and last steps.  Where the edge of each step is.  Use tools that help you move around (mobility aids) if they are needed. These include:  Canes.  Walkers.  Scooters.  Crutches.  Turn on the lights when you go into a dark area. Replace any light bulbs as soon as they burn out.  Set up your furniture so you have a clear path. Avoid moving your furniture around.  If any of your floors are uneven, fix them.  If there are any pets around you, be aware of where they are.  Review your medicines with your doctor. Some medicines can make you feel dizzy. This can increase your chance of falling. Ask your doctor what other things that you can do to help prevent falls. This information is not intended to replace advice given to you by your health care provider. Make sure you discuss any questions you have with your health care provider. Document Released: 11/24/2008 Document Revised: 07/06/2015 Document Reviewed: 03/04/2014 Elsevier Interactive Patient Education  2017 [employer].

## 2019-11-17 NOTE — Progress Notes (Signed)
Subjective:   Calvin Sanders is a 64 y.o. male who presents for an Initial Medicare Annual Wellness Visit.  Virtual Visit via Telephone Note  I connected with  [name] on 11/17/19 at 10:40 AM EDT by telephone and verified that I am speaking with the correct person using two identifiers.  Medicare Annual Wellness visit completed telephonically due to Covid-19 pandemic.   Location: Patient: home Provider: [facility]   I discussed the limitations, risks, security and privacy concerns of performing an evaluation and management service by telephone and the availability of in person appointments. The patient expressed understanding and agreed to proceed.  Unable to perform video visit due to video visit attempted and failed and/or patient does not have video capability.   Some vital signs may be absent or patient reported.   Clemetine Marker, LPN    Review of Systems     Cardiac Risk Factors include: advanced age (>19men, >95 women);diabetes mellitus;dyslipidemia;hypertension;male gender     Objective:    There were no vitals filed for this visit. There is no height or weight on file to calculate BMI.  Advanced Directives 11/17/2019 05/08/2015 09/05/2014  Does Patient Have a Medical Advance Directive? No No No  Would patient like information on creating a medical advance directive? No - Patient declined - No - patient declined information    Current Medications (verified) Outpatient Encounter Medications as of 11/17/2019  Medication Sig  . acetaminophen (TYLENOL) 500 MG tablet Take 2 tablets (1,000 mg total) by mouth every 8 (eight) hours as needed.  [name] atorvastatin (LIPITOR) 40 MG tablet TAKE 1 TABLET(40 MG) BY MOUTH DAILY  . Cholecalciferol (VITAMIN D3) 2000 units TABS Take 1 capsule by mouth.  . citalopram (CELEXA) 20 MG tablet TAKE 1 TABLET(20 MG) BY MOUTH DAILY  . Cyanocobalamin (B-12) 500 MCG TABS Take 1 tablet by mouth daily.  [name] FARXIGA 10 MG TABS tablet TAKE 1 TABLET BY  MOUTH DAILY  . glimepiride (AMARYL) 2 MG tablet TAKE 1 TABLET(2 MG) BY MOUTH DAILY BEFORE BREAKFAST  . losartan-hydrochlorothiazide (HYZAAR) 100-25 MG tablet TAKE 1 TABLET BY MOUTH DAILY  . metFORMIN (GLUCOPHAGE) 500 MG tablet TAKE 2 TABLETS IN THE MORNING AND 1 TABLET IN THE EVENING  . sildenafil (VIAGRA) 100 MG tablet Take 1 tablet (100 mg total) by mouth daily as needed for erectile dysfunction. Take two hours prior to intercourse on an empty stomach  . glucose blood test strip One Touch Zero Flex glucose test strips, use as directed (Patient not taking: Reported on 11/17/2019)   No facility-administered encounter medications on file as of 11/17/2019.    Allergies (verified) Patient has no known allergies.   History: Past Medical History:  Diagnosis Date  . Alcohol dependence, daily use ([place]) 08/05/2014   Previously 6-8 beers per day, cut back in 2020 to 2 per day; in 09/2018 he is only drinking 2 beers on Sat and Sun  . Diabetes mellitus without complication ([place])   . Hypertension    Past Surgical History:  Procedure Laterality Date  . NO PAST SURGERIES     Family History  Problem Relation Age of Onset  . Diabetes Mother   . Cancer Father        bone  . Heart disease Brother   . Prostate cancer Brother   . Kidney cancer Neg Hx   . Kidney disease Neg Hx   . Bladder Cancer Neg Hx    Social History   Socioeconomic History  . Marital status: Married  Spouse name: Not on file  . Number of children: Not on file  . Years of education: Not on file  . Highest education level: Not on file  Occupational History  . Not on file  Tobacco Use  . Smoking status: Former Smoker    Packs/day: 0.50    Years: 45.00    Pack years: 22.50    Types: Cigarettes    Start date: 02/11/1973    Quit date: 02/07/2019    Years since quitting: 0.7  . Smokeless tobacco: Never Used  Vaping Use  . Vaping Use: Never used  Substance and Sexual Activity  . Alcohol use: Yes    Alcohol/week: 14.0  standard drinks    Types: 14 Cans of beer per week    Comment: back up to 1-2 beers per day  . Drug use: No  . Sexual activity: Yes    Partners: Female  Other Topics Concern  . Not on file  Social History Narrative  . Not on file   Social Determinants of Health   Financial Resource Strain: Low Risk   . Difficulty of Paying Living Expenses: Not hard at all  Food Insecurity: No Food Insecurity  . Worried About Charity fundraiser in the Last Year: Never true  . Ran Out of Food in the Last Year: Never true  Transportation Needs: No Transportation Needs  . Lack of Transportation (Medical): No  . Lack of Transportation (Non-Medical): No  Physical Activity: Inactive  . Days of Exercise per Week: 0 days  . Minutes of Exercise per Session: 0 min  Stress: No Stress Concern Present  . Feeling of Stress : Not at all  Social Connections: Moderately Isolated  . Frequency of Communication with Friends and Family: More than three times a week  . Frequency of Social Gatherings with Friends and Family: More than three times a week  . Attends Religious Services: Never  . Active Member of Clubs or Organizations: No  . Attends Archivist Meetings: Never  . Marital Status: Married    Tobacco Counseling Counseling given: Not Answered   Clinical Intake:  Pre-visit preparation completed: Yes  Pain : No/denies pain     Nutritional Risks: None Diabetes: Yes CBG done?: No Did pt. bring in CBG monitor from home?: No  How often do you need to have someone help you when you read instructions, pamphlets, or other written materials from your doctor or pharmacy?: 1 - Never  Nutrition Risk Assessment:  Has the patient had any N/V/D within the last 2 months?  No  Does the patient have any non-healing wounds?  No  Has the patient had any unintentional weight loss or weight gain?  No   Diabetes:  Is the patient diabetic?  Yes  If diabetic, was a CBG obtained today?  No  Did the  patient bring in their glucometer from home?  No  How often do you monitor your CBG's? Pt does not check blood sugar.   Financial Strains and Diabetes Management:  Are you having any financial strains with the device, your supplies or your medication? No .  Does the patient want to be seen by Chronic Care Management for management of their diabetes?  No  Would the patient like to be referred to a Nutritionist or for Diabetic Management?  No   Diabetic Exams:  Diabetic Eye Exam: Completed 03/03/19 negative retinopathy.   Diabetic Foot Exam: Completed 06/16/19.    Interpreter Needed?: No  Information entered  by :: Clemetine Marker LPN   Activities of Daily Living In your present state of health, do you have any difficulty performing the following activities: 11/17/2019 02/24/2019  Hearing? N N  Comment declines hearing aids -  Vision? N N  Difficulty concentrating or making decisions? N N  Walking or climbing stairs? N N  Dressing or bathing? N N  Doing errands, shopping? N N  Preparing Food and eating ? N -  Using the Toilet? N -  In the past six months, have you accidently leaked urine? N -  Do you have problems with loss of bowel control? N -  Managing your Medications? N -  Managing your Finances? N -  Some recent data might be hidden    Patient Care Team: Glean Hess, MD as PCP - General (Internal Medicine) Nori Riis, PA-C as Physician Assistant (Urology)  Indicate any recent Medical Services you may have received from other than [facility] providers in the past year (date may be approximate).     Assessment:   This is a routine wellness examination for [place].  Hearing/Vision screen  Hearing Screening   125Hz  250Hz  500Hz  1000Hz  2000Hz  3000Hz  4000Hz  6000Hz  8000Hz   Right ear:           Left ear:           Comments: Pt denies hearing difficulty  Vision Screening Comments: [facility]  Dietary issues and exercise activities  discussed: Current Exercise Habits: The patient has a physically strenuous job, but has no regular exercise apart from work., Exercise limited by: None identified  Goals    . Patient Stated     Recommend lowering A1c to goal of less than 7% with healthy eating and physical activity      Depression Screen PHQ 2/9 Scores 11/17/2019 10/20/2019 06/16/2019 02/24/2019 09/28/2018 05/05/2018 11/04/2017  PHQ - 2 Score 0 0 0 0 0 0 0  PHQ- 9 Score - 0 0 0 - - 0    Fall Risk Fall Risk  11/17/2019 10/20/2019 06/16/2019 05/05/2018 04/20/2018  Falls in the past year? 0 0 0 0 0  Number falls in past yr: 0 0 0 0 -  Injury with Fall? 0 0 0 0 -  Risk for fall due to : No Fall Risks No Fall Risks No Fall Risks - -  Follow up Falls prevention discussed Falls evaluation completed Falls evaluation completed Falls evaluation completed -    Any stairs in or around the home? No  If so, are there any without handrails? No  Home free of loose throw rugs in walkways, pet beds, electrical cords, etc? Yes  Adequate lighting in your home to reduce risk of falls? Yes   ASSISTIVE DEVICES UTILIZED TO PREVENT FALLS:  Life alert? No  Use of a cane, walker or w/c? No  Grab bars in the bathroom? No  Shower chair or bench in shower? Yes  Elevated toilet seat or a handicapped toilet? No   TIMED UP AND GO:  Was the test performed? No . Telephonic visit.   Cognitive Function: 6CIT completed 06/16/19; pt declined during AWV     6CIT Screen 06/16/2019 05/05/2018  What Year? 4 points 0 points  What month? 3 points 0 points  What time? 3 points 0 points  Count back from 20 0 points 0 points  Months in reverse 0 points 0 points  Repeat phrase 0 points 2 points  Total Score 10 2  Immunizations Immunization History  Administered Date(s) Administered  . Fluad Quad(high Dose 65+) 10/20/2019  . Influenza,inj,Quad PF,6+ Mos 11/26/2018  . Moderna SARS-COVID-2 Vaccination 06/30/2019, 07/28/2019  . Pneumococcal Conjugate-13  09/16/2016  . Pneumococcal Polysaccharide-23 02/24/2019  . Tdap 09/16/2016    TDAP status: Up to date   Flu Vaccine status: Up to date   Pneumococcal vaccine status: Up to date   Covid-19 vaccine status: Completed vaccines  Qualifies for Shingles Vaccine? Yes   Zostavax completed No   Shingrix Completed?: No.    Education has been provided regarding the importance of this vaccine. Patient has been advised to call insurance company to determine out of pocket expense if they have not yet received this vaccine. Advised may also receive vaccine at local pharmacy or Health Dept. Verbalized acceptance and understanding.  Screening Tests Health Maintenance  Topic Date Due  . HIV Screening  Never done  . OPHTHALMOLOGY EXAM  03/02/2020  . HEMOGLOBIN A1C  04/18/2020  . FOOT EXAM  06/15/2020  . COLONOSCOPY  07/01/2021  . TETANUS/TDAP  09/17/2026  . INFLUENZA VACCINE  Completed  . COVID-19 Vaccine  Completed  . Hepatitis C Screening  Completed  . PNA vac Low Risk Adult  Completed    Health Maintenance  Health Maintenance Due  Topic Date Due  . HIV Screening  Never done    Colorectal cancer screening: Completed 07/02/11. Repeat every 10 years  Lung Cancer Screening: (Low Dose CT Chest recommended if Age 57-80 years, 30 pack-year currently smoking OR have quit w/in 15years.) does not qualify.   Additional Screening:  Hepatitis C Screening: does qualify; Completed 09/16/16  Vision Screening: Recommended annual ophthalmology exams for early detection of glaucoma and other disorders of the eye. Is the patient up to date with their annual eye exam?  Yes  Who is the provider or what is the name of the office in which the patient attends annual eye exams? [facility]  Dental Screening: Recommended annual dental exams for proper oral hygiene  Community Resource Referral / Chronic Care Management: CRR required this visit?  No   CCM required this visit?  No      Plan:      I have personally reviewed and noted the following in the patient's chart:   . Medical and social history . Use of alcohol, tobacco or illicit drugs  . Current medications and supplements . Functional ability and status . Nutritional status . Physical activity . Advanced directives . List of other physicians . Hospitalizations, surgeries, and ER visits in previous 12 months . Vitals . Screenings to include cognitive, depression, and falls . Referrals and appointments  In addition, I have reviewed and discussed with patient certain preventive protocols, quality metrics, and best practice recommendations. A written personalized care plan for preventive services as well as general preventive health recommendations were provided to patient.     Clemetine Marker, LPN   73/03/2023   Nurse Notes: none

## 2019-11-30 ENCOUNTER — Other Ambulatory Visit: Payer: Self-pay | Admitting: Internal Medicine

## 2019-11-30 DIAGNOSIS — E118 Type 2 diabetes mellitus with unspecified complications: Secondary | ICD-10-CM

## 2019-12-05 ENCOUNTER — Other Ambulatory Visit: Payer: Self-pay | Admitting: Internal Medicine

## 2019-12-05 DIAGNOSIS — I10 Essential (primary) hypertension: Secondary | ICD-10-CM

## 2019-12-05 NOTE — Telephone Encounter (Signed)
Requested Prescriptions  Pending Prescriptions Disp Refills  . losartan-hydrochlorothiazide (HYZAAR) 100-25 MG tablet [Pharmacy Med Name: LOSARTAN/HCTZ 100/25MG  TABLETS] 90 tablet 0    Sig: TAKE 1 TABLET BY MOUTH DAILY     Cardiovascular: ARB + Diuretic Combos Passed - 12/05/2019  8:00 AM      Passed - K in normal range and within 180 days    Potassium  Date Value Ref Range Status  06/16/2019 4.1 3.5 - 5.2 mmol/L Final         Passed - Na in normal range and within 180 days    Sodium  Date Value Ref Range Status  06/16/2019 135 134 - 144 mmol/L Final         Passed - Cr in normal range and within 180 days    Creatinine, Ser  Date Value Ref Range Status  06/16/2019 1.21 0.76 - 1.27 mg/dL Final         Passed - Ca in normal range and within 180 days    Calcium  Date Value Ref Range Status  06/16/2019 9.9 8.6 - 10.2 mg/dL Final         Passed - Patient is not pregnant      Passed - Last BP in normal range    BP Readings from Last 1 Encounters:  10/20/19 132/66         Passed - Valid encounter within last 6 months    Recent Outpatient Visits          1 month ago DM (diabetes mellitus), type 2 with complications [facility])   [facility] Glean Hess, MD   5 months ago Annual physical exam   [facility] Glean Hess, MD   9 months ago DM (diabetes mellitus), type 2 with complications [facility])   [place], Laura H, MD   1 year ago DM (diabetes mellitus), type 2 with complications [facility])   [facility] Glean Hess, MD   1 year ago Annual physical exam   [facility] Glean Hess, MD      Future Appointments            In 2 months Army Melia Jesse Sans, MD [facility], [place]   In 4 months [place], [facility]   In 6 months Army Melia, Jesse Sans, MD [facility], [facility]

## 2020-01-12 ENCOUNTER — Other Ambulatory Visit: Payer: Self-pay | Admitting: Internal Medicine

## 2020-01-12 DIAGNOSIS — E118 Type 2 diabetes mellitus with unspecified complications: Secondary | ICD-10-CM

## 2020-02-10 ENCOUNTER — Other Ambulatory Visit: Payer: Self-pay | Admitting: Internal Medicine

## 2020-02-10 ENCOUNTER — Ambulatory Visit: Payer: Medicare Other | Admitting: Internal Medicine

## 2020-02-10 DIAGNOSIS — E118 Type 2 diabetes mellitus with unspecified complications: Secondary | ICD-10-CM

## 2020-02-10 DIAGNOSIS — F324 Major depressive disorder, single episode, in partial remission: Secondary | ICD-10-CM

## 2020-02-14 ENCOUNTER — Ambulatory Visit (INDEPENDENT_AMBULATORY_CARE_PROVIDER_SITE_OTHER): Payer: BC Managed Care – PPO | Admitting: Internal Medicine

## 2020-02-14 ENCOUNTER — Other Ambulatory Visit: Payer: Self-pay

## 2020-02-14 ENCOUNTER — Encounter: Payer: Self-pay | Admitting: Internal Medicine

## 2020-02-14 VITALS — BP 124/58 | HR 68 | Temp 98.4°F | Ht 68.0 in | Wt 170.0 lb

## 2020-02-14 DIAGNOSIS — I10 Essential (primary) hypertension: Secondary | ICD-10-CM | POA: Diagnosis not present

## 2020-02-14 DIAGNOSIS — E118 Type 2 diabetes mellitus with unspecified complications: Secondary | ICD-10-CM | POA: Diagnosis not present

## 2020-02-14 MED ORDER — DAPAGLIFLOZIN PROPANEDIOL 10 MG PO TABS: 10.0000 mg | ORAL_TABLET | Freq: Every day | ORAL | 1 refills | Status: DC

## 2020-02-14 NOTE — Patient Instructions (Addendum)
Diabetes Eye exam is due this month.  Please schedule at [facility].

## 2020-02-14 NOTE — Progress Notes (Signed)
Date:  02/14/2020   Name:  Calvin Sanders   DOB:  02/20/1955   MRN:  [id number]   Chief Complaint: Diabetes and Hypertension  Diabetes He presents for his follow-up diabetic visit. He has type 2 diabetes mellitus. His disease course has been stable. Pertinent negatives for hypoglycemia include no headaches or tremors. Pertinent negatives for diabetes include no chest pain, no fatigue, no polydipsia and no polyuria. Current diabetic treatments: farxiga, metformin, glimepiride. He is compliant with treatment all of the time. An ACE inhibitor/angiotensin II receptor blocker is being taken.  Hypertension This is a chronic problem. The problem is controlled. Pertinent negatives include no chest pain, headaches, palpitations or shortness of breath. Past treatments include angiotensin blockers and diuretics. The current treatment provides significant improvement.    Lab Results  Component Value Date   CREATININE 1.21 06/16/2019   BUN 12 06/16/2019   NA 135 06/16/2019   K 4.1 06/16/2019   CL 95 (L) 06/16/2019   CO2 21 06/16/2019   Lab Results  Component Value Date   CHOL 147 06/16/2019   HDL 47 06/16/2019   LDLCALC 81 06/16/2019   TRIG 100 06/16/2019   CHOLHDL 3.1 06/16/2019   Lab Results  Component Value Date   TSH 1.160 03/19/2017   Lab Results  Component Value Date   HGBA1C 8.2 (H) 10/20/2019   Lab Results  Component Value Date   WBC 8.0 06/16/2019   HGB 16.2 06/16/2019   HCT 45.9 06/16/2019   MCV 97 06/16/2019   PLT 198 06/16/2019   Lab Results  Component Value Date   ALT 17 06/16/2019   AST 19 06/16/2019   ALKPHOS 80 06/16/2019   BILITOT 0.8 06/16/2019     Review of Systems  Constitutional: Negative for appetite change, fatigue and unexpected weight change.  Eyes: Negative for visual disturbance.  Respiratory: Negative for cough, shortness of breath and wheezing.   Cardiovascular: Negative for chest pain, palpitations and leg swelling.  Gastrointestinal:  Negative for abdominal pain and blood in stool.  Endocrine: Negative for polydipsia and polyuria.  Genitourinary: Negative for dysuria and hematuria.  Skin: Negative for color change and rash.  Neurological: Negative for tremors, numbness and headaches.  Psychiatric/Behavioral: Negative for dysphoric mood.    Patient Active Problem List   Diagnosis Date Noted  . MCI (mild cognitive impairment) 03/19/2017  . Major depressive disorder, recurrent episode, moderate (HCC) 03/19/2017  . B12 deficiency 05/22/2016  . Vitamin D deficiency 02/08/2015  . 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 Family history of prostate cancer 09/06/2014  . DM (diabetes mellitus), type 2 with complications (HCC) 08/05/2014  . Hyperlipidemia associated with type 2 diabetes mellitus (HCC) 08/05/2014  . Tobacco use disorder, severe, in early remission 08/05/2014  . Colon polyps 05/14/2014  . Chronic prostatitis 05/14/2014  . Essential hypertension 05/14/2014  . BPH (benign prostatic hyperplasia) 05/14/2014    No Known Allergies  Past Surgical History:  Procedure Laterality Date  . NO PAST SURGERIES      Social History   Tobacco Use  . Smoking status: Current Every Day Smoker    Packs/day: 0.25    Years: 45.00    Pack years: 11.25    Types: Cigarettes    Start date: 02/11/1973  . Smokeless tobacco: Never Used  . Tobacco comment: 3 a day   Vaping Use  . Vaping Use: Never used  Substance Use Topics  . Alcohol use: Yes    Alcohol/week: 14.0 standard drinks  Types: 14 Cans of beer per week    Comment: back up to 1-2 beers per day  . Drug use: No     Medication list has been reviewed and updated.  Current Meds  Medication Sig  . acetaminophen (TYLENOL) 500 MG tablet Take 2 tablets (1,000 mg total) by mouth every 8 (eight) hours as needed.  [name] atorvastatin (LIPITOR) 40 MG tablet TAKE 1 TABLET(40 MG) BY MOUTH DAILY  . Cholecalciferol (VITAMIN D3) 2000 units TABS Take 1 capsule by  mouth.  . citalopram (CELEXA) 20 MG tablet TAKE 1 TABLET(20 MG) BY MOUTH DAILY  . Cyanocobalamin (B-12) 500 MCG TABS Take 1 tablet by mouth daily.  [name] FARXIGA 10 MG TABS tablet TAKE 1 TABLET BY MOUTH DAILY  . glimepiride (AMARYL) 2 MG tablet TAKE 1 TABLET(2 MG) BY MOUTH DAILY BEFORE BREAKFAST  . losartan-hydrochlorothiazide (HYZAAR) 100-25 MG tablet TAKE 1 TABLET BY MOUTH DAILY  . metFORMIN (GLUCOPHAGE) 500 MG tablet TAKE 2 TABLETS IN THE MORNING AND 1 TABLET IN THE EVENING  . sildenafil (VIAGRA) 100 MG tablet Take 1 tablet (100 mg total) by mouth daily as needed for erectile dysfunction. Take two hours prior to intercourse on an empty stomach  . [DISCONTINUED] glucose blood test strip One Touch Zero Flex glucose test strips, use as directed    PHQ 2/9 Scores 02/14/2020 11/17/2019 10/20/2019 06/16/2019  PHQ - 2 Score 0 0 0 0  PHQ- 9 Score 0 - 0 0    GAD 7 : Generalized Anxiety Score 02/14/2020 10/20/2019 06/16/2019  Nervous, Anxious, on Edge 0 0 0  Control/stop worrying 0 0 0  Worry too much - different things 0 0 0  Trouble relaxing 0 0 0  Restless 0 0 0  Easily annoyed or irritable 0 0 0  Afraid - awful might happen 0 0 0  Total GAD 7 Score 0 0 0  Anxiety Difficulty - Not difficult at all Not difficult at all   6CIT Screen 02/14/2020 06/16/2019 05/05/2018  What Year? 0 points 4 points 0 points  What month? 0 points 3 points 0 points  What time? 0 points 3 points 0 points  Count back from 20 0 points 0 points 0 points  Months in reverse 0 points 0 points 0 points  Repeat phrase 0 points 0 points 2 points  Total Score 0 10 2      BP Readings from Last 3 Encounters:  02/14/20 (!) 124/58  10/20/19 132/66  06/16/19 112/74    Physical Exam Vitals and nursing note reviewed.  Constitutional:      General: He is not in acute distress.    Appearance: He is well-developed.  HENT:     Head: Normocephalic and atraumatic.  Pulmonary:     Effort: Pulmonary effort is normal. No respiratory  distress.  Musculoskeletal:        General: Normal range of motion.  Skin:    General: Skin is warm and dry.     Findings: No rash.  Neurological:     Mental Status: He is alert and oriented to person, place, and time.  Psychiatric:        Mood and Affect: Mood and affect normal.        Behavior: Behavior normal.        Thought Content: Thought content normal.     Wt Readings from Last 3 Encounters:  02/14/20 170 lb (77.1 kg)  10/20/19 176 lb (79.8 kg)  06/16/19 171 lb (77.6 kg)  BP (!) 124/58   Pulse 68   Temp 98.4 F (36.9 C) (Oral)   Ht 5\' 8"  (1.727 m)   Wt 170 lb (77.1 kg)   SpO2 96%   BMI 25.85 kg/m   Assessment and Plan: 1. DM (diabetes mellitus), type 2 with complications ([place]) Clinically stable by exam and report without s/s of hypoglycemia. DM complicated by HTN. Tolerating medications well without side effects or other concerns. Due for Eye exam - dapagliflozin propanediol (FARXIGA) 10 MG TABS tablet; Take 1 tablet (10 mg total) by mouth daily.  Dispense: 90 tablet; Refill: 1 - Hemoglobin A1c  2. Essential hypertension Clinically stable exam with well controlled BP. Tolerating medications without side effects at this time. Pt to continue current regimen and low sodium diet; benefits of regular exercise as able discussed.    Partially dictated using Editor, commissioning. Any errors are unintentional.  Halina Maidens, MD [facility]  02/14/2020

## 2020-02-15 DIAGNOSIS — E118 Type 2 diabetes mellitus with unspecified complications: Secondary | ICD-10-CM | POA: Diagnosis not present

## 2020-02-16 LAB — HEMOGLOBIN A1C
Est. average glucose Bld gHb Est-mCnc: 177 mg/dL
Hgb A1c MFr Bld: 7.8 % — ABNORMAL HIGH (ref 4.8–5.6)

## 2020-04-27 ENCOUNTER — Other Ambulatory Visit: Payer: Self-pay

## 2020-05-01 ENCOUNTER — Ambulatory Visit
Admission: RE | Admit: 2020-05-01 | Discharge: 2020-05-01 | Disposition: A | Discharge status: Home / Self Care | Payer: BC Managed Care – PPO | Attending: Urology | Admitting: Urology

## 2020-05-01 ENCOUNTER — Other Ambulatory Visit: Payer: Self-pay | Admitting: *Deleted

## 2020-05-01 ENCOUNTER — Other Ambulatory Visit: Payer: Self-pay

## 2020-05-01 DIAGNOSIS — N401 Enlarged prostate with lower urinary tract symptoms: Secondary | ICD-10-CM

## 2020-05-01 DIAGNOSIS — Z8042 Family history of malignant neoplasm of prostate: Secondary | ICD-10-CM | POA: Diagnosis not present

## 2020-05-02 ENCOUNTER — Ambulatory Visit: Payer: Self-pay | Admitting: Urology

## 2020-05-02 LAB — PSA: Prostatic Specific Antigen: 3.26 ng/mL (ref 0.00–4.00)

## 2020-05-05 NOTE — Progress Notes (Signed)
04/29/2019 3:45 PM   Calvin Sanders 1955-03-28 [id number]  Referring provider: Glean Hess, MD [address] [place],  [place] [address]  Chief Complaint  Patient presents with  . Follow-up    1 year follow-up   Urological history: 1. BPH with LU TS -PSA 3.26 in 04/2020 -I PSS 2/1  2. ED -SHIM 15 -Contributing factors of smoking, DM, BPH, alcohol abuse and HTN -Managed with sildenafil 100 mg, on-demand dosing  3. Family history of prostate cancer -brother with prostate cancer  4. Family history of testicular cancer -son diagnosed with testicular cancer at age 64 - cleared  HPI: Calvin Sanders is a 65 y.o. male who presents today for his yearly follow up.  No urinary complaints at this visit.    Patient denies any modifying or aggravating factors.  Patient denies any gross hematuria, dysuria or suprapubic/flank pain.  Patient denies any fevers, chills, nausea or vomiting.     IPSS    Row Name 05/08/20 1500         International Prostate Symptom Score   How often have you had the sensation of not emptying your bladder? Not at All     How often have you had to urinate less than every two hours? Less than 1 in 5 times     How often have you found you stopped and started again several times when you urinated? Not at All     How often have you found it difficult to postpone urination? Less than 1 in 5 times     How often have you had a weak urinary stream? Not at All     How often have you had to strain to start urination? Not at All     How many times did you typically get up at night to urinate? None     Total IPSS Score 2           Quality of Life due to urinary symptoms   If you were to spend the rest of your life with your urinary condition just the way it is now how would you feel about that? Pleased            Score:  1-7 Mild 8-19 Moderate 20-35 Severe  Patient still having spontaneous erections.  He denies any pain or curvature  with erections.      SHIM    Row Name 05/08/20 1532         SHIM: Over the last 6 months:   How do you rate your confidence that you could get and keep an erection? Moderate     When you had erections with sexual stimulation, how often were your erections hard enough for penetration (entering your partner)? Sometimes (about half the time)     During sexual intercourse, how often were you able to maintain your erection after you had penetrated (entered) your partner? Sometimes (about half the time)     During sexual intercourse, how difficult was it to maintain your erection to completion of intercourse? Difficult     When you attempted sexual intercourse, how often was it satisfactory for you? Sometimes (about half the time)           SHIM Total Score   SHIM 15            Score: 1-7 Severe ED 8-11 Moderate ED 12-16 Mild-Moderate ED 17-21 Mild ED 22-25 No ED   PMH: Past Medical History:  Diagnosis Date  .  Alcohol dependence, daily use ([place]) 08/05/2014   Previously 6-8 beers per day, cut back in 2020 to 2 per day; in 09/2018 he is only drinking 2 beers on Sat and Sun  . Diabetes mellitus without complication ([place])   . Hypertension     Surgical History: Past Surgical History:  Procedure Laterality Date  . NO PAST SURGERIES      Home Medications:  Allergies as of 05/08/2020   No Known Allergies     Medication List       Accurate as of May 08, 2020  3:45 PM. If you have any questions, ask your nurse or doctor.        acetaminophen 500 MG tablet Commonly known as: TYLENOL Take 2 tablets (1,000 mg total) by mouth every 8 (eight) hours as needed.   atorvastatin 40 MG tablet Commonly known as: LIPITOR TAKE 1 TABLET(40 MG) BY MOUTH DAILY   B-12 500 MCG Tabs Take 1 tablet by mouth daily.   citalopram 20 MG tablet Commonly known as: CELEXA TAKE 1 TABLET(20 MG) BY MOUTH DAILY   dapagliflozin propanediol 10 MG Tabs tablet Commonly known as: Farxiga Take 1  tablet (10 mg total) by mouth daily.   glimepiride 2 MG tablet Commonly known as: AMARYL TAKE 1 TABLET(2 MG) BY MOUTH DAILY BEFORE BREAKFAST   losartan-hydrochlorothiazide 100-25 MG tablet Commonly known as: HYZAAR TAKE 1 TABLET BY MOUTH DAILY   metFORMIN 500 MG tablet Commonly known as: GLUCOPHAGE TAKE 2 TABLETS IN THE MORNING AND 1 TABLET IN THE EVENING   sildenafil 100 MG tablet Commonly known as: VIAGRA Take 1 tablet (100 mg total) by mouth daily as needed for erectile dysfunction. Take two hours prior to intercourse on an empty stomach   Vitamin D3 50 MCG (2000 UT) Tabs Take 1 capsule by mouth.       Allergies: No Known Allergies  Family History: Family History  Problem Relation Age of Onset  . Diabetes Mother   . Cancer Father        bone  . Heart disease Brother   . Prostate cancer Brother   . Kidney cancer Neg Hx   . Kidney disease Neg Hx   . Bladder Cancer Neg Hx     Social History:  reports that he has been smoking cigarettes. He started smoking about 47 years ago. He has a 11.25 pack-year smoking history. He has never used smokeless tobacco. He reports current alcohol use of about 14.0 standard drinks of alcohol per week. He reports that he does not use drugs.  ROS: For pertinent review of systems please refer to history of present illness  Physical Exam: BP (!) 152/76   Pulse 72   Ht 5\' 8"  (1.727 m)   Wt 173 lb (78.5 kg)   BMI 26.30 kg/m   Constitutional:  Well nourished. Alert and oriented, No acute distress. HEENT: [place] AT, mask in place.  Trachea midline Cardiovascular: No clubbing, cyanosis, or edema. Respiratory: Normal respiratory effort, no increased work of breathing. GU: No CVA tenderness.  No bladder fullness or masses.  Patient with circumcised phallus.  Urethral meatus is patent.  No penile discharge. No penile lesions or rashes. Scrotum without lesions, cysts, rashes and/or edema.  Testicles are located scrotally bilaterally.  They are  atrophic.  No masses are appreciated in the testicles. Left and right epididymis are normal. Rectal: Patient with  normal sphincter tone. Anus and perineum without scarring or rashes. No rectal masses are appreciated. Prostate is approximately  55+ grams, could only palpate the apex and midportion of the gland, apex is firm and there is a small linear nodule on the left edge of apex (60mm x 72mm). Seminal vesicles could not be palpated.  Lymph: No inguinal adenopathy. Neurologic: Grossly intact, no focal deficits, moving all 4 extremities. Psychiatric: Normal mood and affect.   Laboratory Data: Component     Latest Ref Rng & Units 09/20/2016 04/27/2019 05/01/2020  Prostatic Specific Antigen     0.00 - 4.00 ng/mL 2.39 2.68 3.26   Component     Latest Ref Rng & Units 02/15/2020  Hemoglobin A1C     4.8 - 5.6 % 7.8 (H)  Est. average glucose Bld gHb Est-mCnc     mg/dL 177      I have reviewed the labs  Assessment & Plan:    1. BPH with LU TS -symptoms stable -Continue conservative management, avoiding bladder irritants and timed voiding's   2. Erectile dysfunction -symptoms stable -Continue Viagra  3. Increase in PSA velocity/abnormal DRE -Explained to the patient that the findings are concerning for prostate cancer and that a prostate biopsy would be recommended for further evaluation, he asked if there were other options he could pursue at this time.  His other options would be to undergo a prostate MRI for further stratification to see if the rise in the PSA and the abnormalities of feeling on his prostate exam are more likely due to high-grade prostate cancer versus low-grade or benign growth or repeat his PSA and DRE in 3 months to see if the abnormalities during the prostate exam improve and the PSA decreases.  I stated the only way to be sure is to have the prostate biopsy at this time as the other 2 options could delay a diagnosis or miss a prostate cancer altogether -He would like  still like to return in 3 months for his PSA and DRE  4. Family history of PCa -Patient's brother with a h/o PCa  5. Family history of testicular cancer -testicular exam benign    Return in about 3 months (around 08/08/2020) for PSA and DRE .  Zara Council, PA-C  [facility] [address] [place], [place] [address] [phone]

## 2020-05-08 ENCOUNTER — Ambulatory Visit (INDEPENDENT_AMBULATORY_CARE_PROVIDER_SITE_OTHER): Payer: BC Managed Care – PPO | Admitting: Urology

## 2020-05-08 ENCOUNTER — Other Ambulatory Visit: Payer: Self-pay

## 2020-05-08 ENCOUNTER — Encounter: Payer: Self-pay | Admitting: Urology

## 2020-05-08 VITALS — BP 152/76 | HR 72 | Ht 68.0 in | Wt 173.0 lb

## 2020-05-08 DIAGNOSIS — N401 Enlarged prostate with lower urinary tract symptoms: Secondary | ICD-10-CM

## 2020-05-08 DIAGNOSIS — N529 Male erectile dysfunction, unspecified: Secondary | ICD-10-CM

## 2020-05-08 DIAGNOSIS — N138 Other obstructive and reflux uropathy: Secondary | ICD-10-CM

## 2020-05-08 DIAGNOSIS — Z8042 Family history of malignant neoplasm of prostate: Secondary | ICD-10-CM | POA: Diagnosis not present

## 2020-05-08 DIAGNOSIS — Z8043 Family history of malignant neoplasm of testis: Secondary | ICD-10-CM

## 2020-05-08 DIAGNOSIS — R972 Elevated prostate specific antigen [PSA]: Secondary | ICD-10-CM | POA: Diagnosis not present

## 2020-06-07 ENCOUNTER — Other Ambulatory Visit: Payer: Self-pay | Admitting: Internal Medicine

## 2020-06-07 DIAGNOSIS — E118 Type 2 diabetes mellitus with unspecified complications: Secondary | ICD-10-CM

## 2020-06-07 DIAGNOSIS — I10 Essential (primary) hypertension: Secondary | ICD-10-CM

## 2020-06-12 ENCOUNTER — Other Ambulatory Visit: Payer: Self-pay | Admitting: Internal Medicine

## 2020-06-12 DIAGNOSIS — E118 Type 2 diabetes mellitus with unspecified complications: Secondary | ICD-10-CM

## 2020-06-21 ENCOUNTER — Encounter: Payer: Self-pay | Admitting: Internal Medicine

## 2020-06-21 ENCOUNTER — Ambulatory Visit (INDEPENDENT_AMBULATORY_CARE_PROVIDER_SITE_OTHER): Payer: BC Managed Care – PPO | Admitting: Internal Medicine

## 2020-06-21 ENCOUNTER — Other Ambulatory Visit: Payer: Self-pay

## 2020-06-21 ENCOUNTER — Other Ambulatory Visit: Payer: Self-pay | Admitting: Internal Medicine

## 2020-06-21 VITALS — BP 110/58 | HR 69 | Temp 98.1°F | Ht 68.0 in | Wt 171.0 lb

## 2020-06-21 DIAGNOSIS — Z0000 Encounter for general adult medical examination without abnormal findings: Secondary | ICD-10-CM | POA: Diagnosis not present

## 2020-06-21 DIAGNOSIS — F331 Major depressive disorder, recurrent, moderate: Secondary | ICD-10-CM

## 2020-06-21 DIAGNOSIS — E1169 Type 2 diabetes mellitus with other specified complication: Secondary | ICD-10-CM

## 2020-06-21 DIAGNOSIS — G3184 Mild cognitive impairment, so stated: Secondary | ICD-10-CM | POA: Diagnosis not present

## 2020-06-21 DIAGNOSIS — E118 Type 2 diabetes mellitus with unspecified complications: Secondary | ICD-10-CM

## 2020-06-21 DIAGNOSIS — E785 Hyperlipidemia, unspecified: Secondary | ICD-10-CM | POA: Diagnosis not present

## 2020-06-21 DIAGNOSIS — I10 Essential (primary) hypertension: Secondary | ICD-10-CM

## 2020-06-21 DIAGNOSIS — H353131 Nonexudative age-related macular degeneration, bilateral, early dry stage: Secondary | ICD-10-CM | POA: Diagnosis not present

## 2020-06-21 LAB — POCT URINALYSIS DIPSTICK
Bilirubin, UA: NEGATIVE
Blood, UA: NEGATIVE
Glucose, UA: POSITIVE — AB
Ketones, UA: NEGATIVE
Leukocytes, UA: NEGATIVE
Nitrite, UA: NEGATIVE
Protein, UA: NEGATIVE
Spec Grav, UA: 1.015 (ref 1.010–1.025)
Urobilinogen, UA: 0.2 E.U./dL
pH, UA: 6 (ref 5.0–8.0)

## 2020-06-21 MED ORDER — CITALOPRAM HYDROBROMIDE 20 MG PO TABS: 20.0000 mg | ORAL_TABLET | Freq: Every day | ORAL | 1 refills | Status: DC

## 2020-06-21 MED ORDER — GLIMEPIRIDE 2 MG PO TABS: 2.0000 mg | ORAL_TABLET | Freq: Every day | ORAL | 1 refills | Status: DC

## 2020-06-21 NOTE — Patient Instructions (Addendum)
Schedule Diabetic eye exam  Schedule skin exam with dermatology

## 2020-06-21 NOTE — Progress Notes (Signed)
Date:  06/21/2020   Name:  Calvin Sanders   DOB:  March 17, 1955   MRN:  [id number]   Chief Complaint: Annual Exam  Calvin Sanders is a 65 y.o. male who presents today for his yearly Exam. He feels well. He reports exercising - walking 5 days weekly. Still working. He reports he is sleeping well.   Colonoscopy: 06/2011  Immunization History  Administered Date(s) Administered  . Fluad Quad(high Dose 65+) 10/20/2019  . Influenza,inj,Quad PF,6+ Mos 11/26/2018  . Moderna Sars-Covid-2 Vaccination 06/30/2019, 07/28/2019  . Pneumococcal Conjugate-13 09/16/2016  . Pneumococcal Polysaccharide-23 02/24/2019  . Tdap 09/16/2016    Hypertension This is a chronic problem. The problem is controlled. Pertinent negatives include no chest pain, headaches, palpitations or shortness of breath. Past treatments include angiotensin blockers and diuretics. The current treatment provides significant improvement. There are no compliance problems.   Diabetes He presents for his follow-up diabetic visit. He has type 2 diabetes mellitus. His disease course has been stable. Pertinent negatives for hypoglycemia include no dizziness or headaches. Pertinent negatives for diabetes include no chest pain and no fatigue. Current diabetic treatment includes oral agent (triple therapy) (metformin, glimepiride and farxiga). He is compliant with treatment all of the time. There is no compliance with monitoring of blood glucose. An ACE inhibitor/angiotensin II receptor blocker is being taken. Eye exam is not current.  Hyperlipidemia This is a chronic problem. The problem is controlled. Pertinent negatives include no chest pain, myalgias or shortness of breath. Current antihyperlipidemic treatment includes statins.  Depression        This is a chronic problem.  The problem has been resolved since onset.  Associated symptoms include no fatigue, no appetite change, no myalgias and no headaches.  Past treatments include SSRIs -  Selective serotonin reuptake inhibitors.  Compliance with treatment is good.  Previous treatment provided significant relief.   Lab Results  Component Value Date   CREATININE 1.21 06/16/2019   BUN 12 06/16/2019   NA 135 06/16/2019   K 4.1 06/16/2019   CL 95 (L) 06/16/2019   CO2 21 06/16/2019   Lab Results  Component Value Date   CHOL 147 06/16/2019   HDL 47 06/16/2019   LDLCALC 81 06/16/2019   TRIG 100 06/16/2019   CHOLHDL 3.1 06/16/2019   Lab Results  Component Value Date   TSH 1.160 03/19/2017   Lab Results  Component Value Date   HGBA1C 7.8 (H) 02/15/2020   Lab Results  Component Value Date   WBC 8.0 06/16/2019   HGB 16.2 06/16/2019   HCT 45.9 06/16/2019   MCV 97 06/16/2019   PLT 198 06/16/2019   Lab Results  Component Value Date   ALT 17 06/16/2019   AST 19 06/16/2019   ALKPHOS 80 06/16/2019   BILITOT 0.8 06/16/2019   Lab Results  Component Value Date   PSA1 2.6 05/05/2018   PSA1 2.5 11/04/2017   PSA1 2.2 09/21/2015    Last vitamin D Lab Results  Component Value Date   VD25OH 67.0 06/16/2019      Review of Systems  Constitutional: Negative for appetite change, chills, diaphoresis, fatigue and unexpected weight change.  HENT: Negative for hearing loss, tinnitus, trouble swallowing and voice change.   Eyes: Negative for visual disturbance.  Respiratory: Negative for choking, shortness of breath and wheezing.   Cardiovascular: Negative for chest pain, palpitations and leg swelling.  Gastrointestinal: Negative for abdominal pain, blood in stool, constipation and diarrhea.  Genitourinary: Negative for difficulty urinating,  dysuria and frequency.  Musculoskeletal: Negative for arthralgias, back pain and myalgias.  Skin: Negative for color change and rash.  Neurological: Negative for dizziness, syncope and headaches.  Hematological: Negative for adenopathy.  Psychiatric/Behavioral: Positive for depression. Negative for dysphoric mood and sleep  disturbance.    Patient Active Problem List   Diagnosis Date Noted  . MCI (mild cognitive impairment) 03/19/2017  . Major depressive disorder, recurrent episode, moderate ([place]) 03/19/2017  . B12 deficiency 05/22/2016  . Vitamin D deficiency 02/08/2015  . 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 Family history of prostate cancer 09/06/2014  . DM (diabetes mellitus), type 2 with complications ([place]) 16/11/9602  . Hyperlipidemia associated with type 2 diabetes mellitus ([place]) 08/05/2014  . Tobacco use disorder, severe, in early remission 08/05/2014  . Colon polyps 05/14/2014  . Chronic prostatitis 05/14/2014  . Essential hypertension 05/14/2014  . BPH (benign prostatic hyperplasia) 05/14/2014    No Known Allergies  Past Surgical History:  Procedure Laterality Date  . NO PAST SURGERIES      Social History   Tobacco Use  . Smoking status: Current Every Day Smoker    Packs/day: 0.25    Years: 45.00    Pack years: 11.25    Types: Cigarettes    Start date: 02/11/1973  . Smokeless tobacco: Never Used  . Tobacco comment: 3 a day   Vaping Use  . Vaping Use: Never used  Substance Use Topics  . Alcohol use: Yes    Alcohol/week: 14.0 standard drinks    Types: 14 Cans of beer per week    Comment: back up to 1-2 beers per day  . Drug use: No     Medication list has been reviewed and updated.  Current Meds  Medication Sig  . acetaminophen (TYLENOL) 500 MG tablet Take 2 tablets (1,000 mg total) by mouth every 8 (eight) hours as needed.  [name] atorvastatin (LIPITOR) 40 MG tablet TAKE 1 TABLET(40 MG) BY MOUTH DAILY  . Cholecalciferol (VITAMIN D3) 2000 units TABS Take 1 capsule by mouth.  . citalopram (CELEXA) 20 MG tablet TAKE 1 TABLET(20 MG) BY MOUTH DAILY  . Cyanocobalamin (B-12) 500 MCG TABS Take 1 tablet by mouth daily.  . dapagliflozin propanediol (FARXIGA) 10 MG TABS tablet Take 1 tablet (10 mg total) by mouth daily.  [name] glimepiride (AMARYL) 2 MG tablet TAKE 1 TABLET(2  MG) BY MOUTH DAILY BEFORE BREAKFAST  . losartan-hydrochlorothiazide (HYZAAR) 100-25 MG tablet TAKE 1 TABLET BY MOUTH EVERY DAY  . metFORMIN (GLUCOPHAGE) 500 MG tablet TAKE 2 TABLETS BY MOUTH EVERY MORNING AND 1 TABLET IN THE EVENING  . sildenafil (VIAGRA) 100 MG tablet Take 1 tablet (100 mg total) by mouth daily as needed for erectile dysfunction. Take two hours prior to intercourse on an empty stomach    PHQ 2/9 Scores 06/21/2020 02/14/2020 11/17/2019 10/20/2019  PHQ - 2 Score 0 0 0 0  PHQ- 9 Score 0 0 - 0    GAD 7 : Generalized Anxiety Score 06/21/2020 02/14/2020 10/20/2019 06/16/2019  Nervous, Anxious, on Edge 0 0 0 0  Control/stop worrying 0 0 0 0  Worry too much - different things 0 0 0 0  Trouble relaxing 0 0 0 0  Restless 0 0 0 0  Easily annoyed or irritable 0 0 0 0  Afraid - awful might happen 0 0 0 0  Total GAD 7 Score 0 0 0 0  Anxiety Difficulty Not difficult at all - Not difficult at all Not difficult  at all   6CIT Screen 02/14/2020 06/16/2019 05/05/2018  What Year? 0 points 4 points 0 points  What month? 0 points 3 points 0 points  What time? 0 points 3 points 0 points  Count back from 20 0 points 0 points 0 points  Months in reverse 0 points 0 points 0 points  Repeat phrase 0 points 0 points 2 points  Total Score 0 10 2     BP Readings from Last 3 Encounters:  06/21/20 (!) 110/58  05/08/20 (!) 152/76  02/14/20 (!) 124/58    Physical Exam Vitals and nursing note reviewed.  Constitutional:      Appearance: Normal appearance. He is well-developed.  HENT:     Head: Normocephalic.     Right Ear: Tympanic membrane, ear canal and external ear normal.     Left Ear: Tympanic membrane, ear canal and external ear normal.     Nose: Nose normal.  Eyes:     Conjunctiva/sclera: Conjunctivae normal.     Pupils: Pupils are equal, round, and reactive to light.  Neck:     Thyroid: No thyromegaly.     Vascular: No carotid bruit.  Cardiovascular:     Rate and Rhythm: Normal rate and  regular rhythm.     Heart sounds: Normal heart sounds.  Pulmonary:     Effort: Pulmonary effort is normal.     Breath sounds: Normal breath sounds. No wheezing.  Chest:  Breasts:     Right: No mass.     Left: No mass.    Abdominal:     General: Bowel sounds are normal.     Palpations: Abdomen is soft.     Tenderness: There is no abdominal tenderness.  Musculoskeletal:        General: Normal range of motion.     Cervical back: Normal range of motion and neck supple.     Right lower leg: No edema.     Left lower leg: No edema.  Lymphadenopathy:     Cervical: No cervical adenopathy.  Skin:    General: Skin is warm and dry.     Capillary Refill: Capillary refill takes less than 2 seconds.  Neurological:     General: No focal deficit present.     Mental Status: He is alert and oriented to person, place, and time.     Deep Tendon Reflexes: Reflexes are normal and symmetric.  Psychiatric:        Attention and Perception: Attention normal.        Mood and Affect: Mood normal.        Thought Content: Thought content normal.     Wt Readings from Last 3 Encounters:  06/21/20 171 lb (77.6 kg)  05/08/20 173 lb (78.5 kg)  02/14/20 170 lb (77.1 kg)    BP (!) 110/58   Pulse 69   Temp 98.1 F (36.7 C) (Oral)   Ht 5\' 8"  (1.727 m)   Wt 171 lb (77.6 kg)   SpO2 96%   BMI 26.00 kg/m   Assessment and Plan: 1. Annual physical exam Normal exam Continue healthy diet, exercise  2. Essential hypertension Clinically stable exam with well controlled BP. Tolerating medications without side effects at this time. Pt to continue current regimen and low sodium diet; benefits of regular exercise as able discussed. - CBC with Differential/Platelet - POCT urinalysis dipstick  3. DM (diabetes mellitus), type 2 with complications ([place]) Clinically stable by exam and report without s/s of hypoglycemia. DM complicated by  HTN. Tolerating medications well without side effects or other  concerns. - Comprehensive metabolic panel - Hemoglobin A1c - glimepiride (AMARYL) 2 MG tablet; Take 1 tablet (2 mg total) by mouth daily with breakfast.  Dispense: 90 tablet; Refill: 1 - AMB Referral to [facility] - Microalbumin / creatinine urine ratio  4. Hyperlipidemia associated with type 2 diabetes mellitus ([place]) Tolerating statin medication without side effects at this time LDL is at goal of < 70 on current dose Continue same therapy without change at this time. - Lipid panel  5. Major depressive disorder, recurrent episode, moderate ([facility]) Clinically stable on current regimen with good control of symptoms, No SI or HI. Will continue current therapy. - TSH - citalopram (CELEXA) 20 MG tablet; Take 1 tablet (20 mg total) by mouth daily.  Dispense: 90 tablet; Refill: 1  6. MCI (mild cognitive impairment) stable   Partially dictated using Editor, commissioning. Any errors are unintentional.  Halina Maidens, MD [facility]  06/21/2020

## 2020-06-22 ENCOUNTER — Other Ambulatory Visit: Payer: Self-pay | Admitting: Internal Medicine

## 2020-06-22 DIAGNOSIS — E118 Type 2 diabetes mellitus with unspecified complications: Secondary | ICD-10-CM

## 2020-06-22 LAB — CBC WITH DIFFERENTIAL/PLATELET
Basophils Absolute: 0 10*3/uL (ref 0.0–0.2)
Basos: 0 %
EOS (ABSOLUTE): 0.1 10*3/uL (ref 0.0–0.4)
Eos: 2 %
Hematocrit: 44.3 % (ref 37.5–51.0)
Hemoglobin: 16 g/dL (ref 13.0–17.7)
Immature Grans (Abs): 0 10*3/uL (ref 0.0–0.1)
Immature Granulocytes: 0 %
Lymphocytes Absolute: 1.8 10*3/uL (ref 0.7–3.1)
Lymphs: 26 %
MCH: 33.8 pg — ABNORMAL HIGH (ref 26.6–33.0)
MCHC: 36.1 g/dL — ABNORMAL HIGH (ref 31.5–35.7)
MCV: 94 fL (ref 79–97)
Monocytes Absolute: 0.4 10*3/uL (ref 0.1–0.9)
Monocytes: 6 %
Neutrophils Absolute: 4.5 10*3/uL (ref 1.4–7.0)
Neutrophils: 66 %
Platelets: 233 10*3/uL (ref 150–450)
RBC: 4.74 x10E6/uL (ref 4.14–5.80)
RDW: 12 % (ref 11.6–15.4)
WBC: 6.8 10*3/uL (ref 3.4–10.8)

## 2020-06-22 LAB — COMPREHENSIVE METABOLIC PANEL
ALT: 30 IU/L (ref 0–44)
AST: 21 IU/L (ref 0–40)
Albumin/Globulin Ratio: 1.9 (ref 1.2–2.2)
Albumin: 4.7 g/dL (ref 3.8–4.8)
Alkaline Phosphatase: 94 IU/L (ref 44–121)
BUN/Creatinine Ratio: 12 (ref 10–24)
BUN: 13 mg/dL (ref 8–27)
Bilirubin Total: 0.9 mg/dL (ref 0.0–1.2)
CO2: 23 mmol/L (ref 20–29)
Calcium: 10.2 mg/dL (ref 8.6–10.2)
Chloride: 98 mmol/L (ref 96–106)
Creatinine, Ser: 1.05 mg/dL (ref 0.76–1.27)
Globulin, Total: 2.5 g/dL (ref 1.5–4.5)
Glucose: 149 mg/dL — ABNORMAL HIGH (ref 65–99)
Potassium: 4.2 mmol/L (ref 3.5–5.2)
Sodium: 140 mmol/L (ref 134–144)
Total Protein: 7.2 g/dL (ref 6.0–8.5)
eGFR: 78 mL/min/{1.73_m2} (ref >59)

## 2020-06-22 LAB — MICROALBUMIN / CREATININE URINE RATIO
Creatinine, Urine: 54.5 mg/dL
Microalb/Creat Ratio: 7 mg/g creat (ref 0–29)
Microalbumin, Urine: 4 ug/mL

## 2020-06-22 LAB — HEMOGLOBIN A1C
Est. average glucose Bld gHb Est-mCnc: 206 mg/dL
Hgb A1c MFr Bld: 8.8 % — ABNORMAL HIGH (ref 4.8–5.6)

## 2020-06-22 LAB — LIPID PANEL
Chol/HDL Ratio: 4.3 ratio (ref 0.0–5.0)
Cholesterol, Total: 175 mg/dL (ref 100–199)
HDL: 41 mg/dL (ref >39)
LDL Chol Calc (NIH): 108 mg/dL — ABNORMAL HIGH (ref 0–99)
Triglycerides: 144 mg/dL (ref 0–149)
VLDL Cholesterol Cal: 26 mg/dL (ref 5–40)

## 2020-06-22 LAB — TSH: TSH: 1.56 u[IU]/mL (ref 0.450–4.500)

## 2020-06-26 ENCOUNTER — Other Ambulatory Visit: Payer: Self-pay | Admitting: Internal Medicine

## 2020-06-26 DIAGNOSIS — E118 Type 2 diabetes mellitus with unspecified complications: Secondary | ICD-10-CM

## 2020-06-26 DIAGNOSIS — I10 Essential (primary) hypertension: Secondary | ICD-10-CM

## 2020-07-06 ENCOUNTER — Other Ambulatory Visit: Payer: Self-pay | Admitting: Internal Medicine

## 2020-07-06 DIAGNOSIS — I10 Essential (primary) hypertension: Secondary | ICD-10-CM

## 2020-07-06 DIAGNOSIS — E118 Type 2 diabetes mellitus with unspecified complications: Secondary | ICD-10-CM

## 2020-07-18 DIAGNOSIS — L578 Other skin changes due to chronic exposure to nonionizing radiation: Secondary | ICD-10-CM | POA: Diagnosis not present

## 2020-07-18 DIAGNOSIS — Z872 Personal history of diseases of the skin and subcutaneous tissue: Secondary | ICD-10-CM | POA: Diagnosis not present

## 2020-07-18 DIAGNOSIS — L570 Actinic keratosis: Secondary | ICD-10-CM | POA: Diagnosis not present

## 2020-07-18 DIAGNOSIS — Z86018 Personal history of other benign neoplasm: Secondary | ICD-10-CM | POA: Diagnosis not present

## 2020-07-18 DIAGNOSIS — D485 Neoplasm of uncertain behavior of skin: Secondary | ICD-10-CM | POA: Diagnosis not present

## 2020-07-18 DIAGNOSIS — Z859 Personal history of malignant neoplasm, unspecified: Secondary | ICD-10-CM | POA: Diagnosis not present

## 2020-07-18 DIAGNOSIS — C4441 Basal cell carcinoma of skin of scalp and neck: Secondary | ICD-10-CM | POA: Diagnosis not present

## 2020-07-24 ENCOUNTER — Other Ambulatory Visit: Payer: Self-pay | Admitting: Internal Medicine

## 2020-07-24 DIAGNOSIS — I10 Essential (primary) hypertension: Secondary | ICD-10-CM

## 2020-07-24 DIAGNOSIS — E118 Type 2 diabetes mellitus with unspecified complications: Secondary | ICD-10-CM

## 2020-08-03 DIAGNOSIS — C4442 Squamous cell carcinoma of skin of scalp and neck: Secondary | ICD-10-CM | POA: Diagnosis not present

## 2020-08-07 ENCOUNTER — Other Ambulatory Visit: Payer: Self-pay | Admitting: *Deleted

## 2020-08-07 DIAGNOSIS — N138 Other obstructive and reflux uropathy: Secondary | ICD-10-CM

## 2020-08-07 NOTE — Addendum Note (Signed)
Addended by: Despina Hidden on: 08/07/2020 09:58 AM   Modules accepted: Orders

## 2020-08-09 ENCOUNTER — Other Ambulatory Visit
Admission: RE | Admit: 2020-08-09 | Discharge: 2020-08-09 | Disposition: A | Discharge status: Home / Self Care | Payer: BC Managed Care – PPO | Attending: Urology | Admitting: Urology

## 2020-08-09 ENCOUNTER — Other Ambulatory Visit: Payer: Self-pay

## 2020-08-09 DIAGNOSIS — N138 Other obstructive and reflux uropathy: Secondary | ICD-10-CM | POA: Diagnosis not present

## 2020-08-09 DIAGNOSIS — N401 Enlarged prostate with lower urinary tract symptoms: Secondary | ICD-10-CM | POA: Insufficient documentation

## 2020-08-09 LAB — PSA: Prostatic Specific Antigen: 3.49 ng/mL (ref 0.00–4.00)

## 2020-08-18 NOTE — Progress Notes (Signed)
04/29/2019 4:10 PM   [name] 11-Feb-1956 [id number]  Referring provider: Glean Hess, MD [address] [place],  [place] [address]  Urological history: 1. BPH with LU TS -PSA 3.26 in 04/2020 -I PSS 2/1  2. ED -SHIM 15 -Contributing factors of smoking, DM, BPH, alcohol abuse and HTN -Managed with sildenafil 100 mg, on-demand dosing  3. Family history of prostate cancer -brother with prostate cancer  4. Family history of testicular cancer -son diagnosed with testicular cancer at age 29 - cleared  Chief Complaint  Patient presents with   Follow-up    75mth follow-up    HPI: Calvin Sanders is a 65 y.o. male who presents today 3 month follow up for an abnormal DRE and an increase in PSA velocity with his wife, Joelene Millin.    Prostate is approximately 55+ grams, could only palpate the apex and midportion of the gland, apex is firm and there is a small linear nodule on the left edge of apex (49mm x 66mm). Seminal vesicles could not be palpated and PSA increased to 3.26 from 2.68 last year in March, 2022.    His current PSA is now 3.49.    Patient denies any modifying or aggravating factors.  Patient denies any gross hematuria, dysuria or suprapubic/flank pain.  Patient denies any fevers, chills, nausea or vomiting.    PMH: Past Medical History:  Diagnosis Date   Alcohol dependence, daily use ([place]) 08/05/2014   Previously 6-8 beers per day, cut back in 2020 to 2 per day; in 09/2018 he is only drinking 2 beers on Sat and Sun   Diabetes mellitus without complication ([place])    Hypertension     Surgical History: Past Surgical History:  Procedure Laterality Date   NO PAST SURGERIES      Home Medications:  Allergies as of 08/21/2020   No Known Allergies      Medication List        Accurate as of August 21, 2020  4:10 PM. If you have any questions, ask your nurse or doctor.          acetaminophen 500 MG tablet Commonly known as: TYLENOL Take 2  tablets (1,000 mg total) by mouth every 8 (eight) hours as needed.   atorvastatin 40 MG tablet Commonly known as: LIPITOR TAKE 1 TABLET(40 MG) BY MOUTH DAILY   B-12 500 MCG Tabs Take 1 tablet by mouth daily.   citalopram 20 MG tablet Commonly known as: CELEXA Take 1 tablet (20 mg total) by mouth daily.   Farxiga 10 MG Tabs tablet Generic drug: dapagliflozin propanediol TAKE 1 TABLET BY MOUTH EVERY DAY   glimepiride 2 MG tablet Commonly known as: AMARYL Take 1 tablet (2 mg total) by mouth daily with breakfast.   losartan-hydrochlorothiazide 100-25 MG tablet Commonly known as: HYZAAR TAKE 1 TABLET BY MOUTH EVERY DAY   metFORMIN 500 MG tablet Commonly known as: GLUCOPHAGE TAKE 2 TABLETS BY MOUTH EVERY MORNING AND 1 TABLET IN THE EVENING   sildenafil 100 MG tablet Commonly known as: VIAGRA Take 1 tablet (100 mg total) by mouth daily as needed for erectile dysfunction. Take two hours prior to intercourse on an empty stomach   Vitamin D3 50 MCG (2000 UT) Tabs Take 1 capsule by mouth.        Allergies: No Known Allergies  Family History: Family History  Problem Relation Age of Onset   Diabetes Mother    Cancer Father  bone   Heart disease Brother    Prostate cancer Brother    Kidney cancer Neg Hx    Kidney disease Neg Hx    Bladder Cancer Neg Hx     Social History:  reports that he has been smoking cigarettes. He started smoking about 47 years ago. He has a 11.25 pack-year smoking history. He has never used smokeless tobacco. He reports current alcohol use of about 14.0 standard drinks of alcohol per week. He reports that he does not use drugs.  ROS: For pertinent review of systems please refer to history of present illness  Physical Exam: BP (!) 144/70   Pulse (!) 58   Ht 5\' 8"  (1.727 m)   Wt 175 lb (79.4 kg)   BMI 26.61 kg/m   Constitutional:  Well nourished. Alert and oriented, No acute distress. HEENT: [place] AT, mask in place  Trachea  midline Cardiovascular: No clubbing, cyanosis, or edema. Respiratory: Normal respiratory effort, no increased work of breathing. Rectal: Patient with  normal sphincter tone. Anus and perineum without scarring or rashes. No rectal masses are appreciated. Prostate is approximately 55 + grams, a linear nodule is still palpated in the left lobe along the left edge of the apex, patient was tense during the exam, so I was not able to extend my examining finger past the apex, but I am concerned that I felt the firmness extending past my finger, which would mean it has increased since last exam.  Seminal vesicles could not be palpated Neurologic: Grossly intact, no focal deficits, moving all 4 extremities. Psychiatric: Normal mood and affect.   Laboratory Data: Component     Latest Ref Rng & Units 09/20/2016 04/27/2019 05/01/2020 08/09/2020  Prostatic Specific Antigen     0.00 - 4.00 ng/mL 2.39 2.68 3.26 3.49  I have reviewed the labs  Assessment & Plan:    1. Increase in PSA velocity/abnormal DRE -As I feel the exam is more abnormal today and the PSA has increased, I have recommended he undergo a biopsy -Patient will be schedule for a TRUSPBx of prostate.  The procedure is explained and the risks involved, such as blood in urine, blood in stool, blood in semen, infection, urinary retention, and on rare occasions sepsis and death.  Patient understands the risks as explained to him and he wishes to proceed -he will only drink one beer daily until after his biopsy  2. Family history of PCa -Patient's brother with a h/o PCa   Return in about 3 months (around 11/21/2020) for TRUSPBx of prostate .  Zara Council, PA-C  [facility] [address] [place], [place] [address] [phone]

## 2020-08-21 ENCOUNTER — Other Ambulatory Visit: Payer: Self-pay

## 2020-08-21 ENCOUNTER — Encounter: Payer: Self-pay | Admitting: Urology

## 2020-08-21 ENCOUNTER — Ambulatory Visit (INDEPENDENT_AMBULATORY_CARE_PROVIDER_SITE_OTHER): Payer: BC Managed Care – PPO | Admitting: Urology

## 2020-08-21 VITALS — BP 144/70 | HR 58 | Ht 68.0 in | Wt 175.0 lb

## 2020-08-21 DIAGNOSIS — R972 Elevated prostate specific antigen [PSA]: Secondary | ICD-10-CM

## 2020-08-21 DIAGNOSIS — R3989 Other symptoms and signs involving the genitourinary system: Secondary | ICD-10-CM

## 2020-08-21 NOTE — Patient Instructions (Signed)

## 2020-08-24 ENCOUNTER — Other Ambulatory Visit: Payer: Self-pay | Admitting: Internal Medicine

## 2020-08-24 DIAGNOSIS — E118 Type 2 diabetes mellitus with unspecified complications: Secondary | ICD-10-CM

## 2020-08-24 NOTE — Telephone Encounter (Signed)
Future visit in 1 month  

## 2020-08-30 ENCOUNTER — Ambulatory Visit (INDEPENDENT_AMBULATORY_CARE_PROVIDER_SITE_OTHER): Payer: BC Managed Care – PPO | Admitting: Urology

## 2020-08-30 ENCOUNTER — Encounter: Payer: Self-pay | Admitting: Urology

## 2020-08-30 ENCOUNTER — Other Ambulatory Visit: Payer: Self-pay

## 2020-08-30 VITALS — BP 118/64 | HR 63 | Ht 68.0 in | Wt 175.0 lb

## 2020-08-30 DIAGNOSIS — R972 Elevated prostate specific antigen [PSA]: Secondary | ICD-10-CM

## 2020-08-30 MED ORDER — LEVOFLOXACIN 500 MG PO TABS
500.0000 mg | ORAL_TABLET | Freq: Once | ORAL | Status: AC
  Administered 2020-08-30: 500 mg via ORAL

## 2020-08-30 MED ORDER — GENTAMICIN SULFATE 40 MG/ML IJ SOLN
80.0000 mg | Freq: Once | INTRAMUSCULAR | Status: AC
  Administered 2020-08-30: 80 mg via INTRAMUSCULAR

## 2020-08-30 NOTE — Progress Notes (Signed)
Prostate Biopsy Procedure   Informed consent was obtained after discussing risks/benefits of the procedure.  A time out was performed to ensure correct patient identity.  Pre-Procedure: - Last PSA Level: Abnormal PSA velocity 3.49 629/22 - Gentamicin given prophylactically - Levaquin 500 mg administered PO - Unable to tolerate insertion of the transrectal ultrasound probe and the procedure was unable to be performed  Will need to schedule TRUS/biopsy prostate and same-day surgery under sedation

## 2020-09-06 ENCOUNTER — Ambulatory Visit: Payer: BC Managed Care – PPO | Admitting: Urology

## 2020-09-07 ENCOUNTER — Other Ambulatory Visit: Payer: Self-pay | Admitting: Internal Medicine

## 2020-09-07 DIAGNOSIS — E1169 Type 2 diabetes mellitus with other specified complication: Secondary | ICD-10-CM

## 2020-09-07 NOTE — Telephone Encounter (Signed)
Requested Prescriptions  Pending Prescriptions Disp Refills  . atorvastatin (LIPITOR) 40 MG tablet [Pharmacy Med Name: ATORVASTATIN '40MG'$  TABLETS] 90 tablet 0    Sig: TAKE 1 TABLET BY MOUTH DAILY     Cardiovascular:  Antilipid - Statins Failed - 09/07/2020  3:42 AM      Failed - LDL in normal range and within 360 days    LDL Chol Calc (NIH)  Date Value Ref Range Status  06/21/2020 108 (H) 0 - 99 mg/dL Final         Passed - Total Cholesterol in normal range and within 360 days    Cholesterol, Total  Date Value Ref Range Status  06/21/2020 175 100 - 199 mg/dL Final         Passed - HDL in normal range and within 360 days    HDL  Date Value Ref Range Status  06/21/2020 41 >39 mg/dL Final         Passed - Triglycerides in normal range and within 360 days    Triglycerides  Date Value Ref Range Status  06/21/2020 144 0 - 149 mg/dL Final         Passed - Patient is not pregnant      Passed - Valid encounter within last 12 months    Recent Outpatient Visits          2 months ago Annual physical exam   [facility] Glean Hess, MD   6 months ago Essential hypertension   [place], MD   10 months ago DM (diabetes mellitus), type 2 with complications [facility])   [facility] Glean Hess, MD   1 year ago Annual physical exam   [facility] Glean Hess, MD   1 year ago DM (diabetes mellitus), type 2 with complications [facility])   [place], Laura H, MD      Future Appointments            In 1 month Army Melia, Jesse Sans, MD [facility], [facility]

## 2020-09-20 ENCOUNTER — Other Ambulatory Visit: Payer: Self-pay | Admitting: Internal Medicine

## 2020-09-20 DIAGNOSIS — E118 Type 2 diabetes mellitus with unspecified complications: Secondary | ICD-10-CM

## 2020-09-21 ENCOUNTER — Other Ambulatory Visit: Payer: Self-pay

## 2020-09-21 ENCOUNTER — Other Ambulatory Visit: Payer: Self-pay | Admitting: Urology

## 2020-09-21 DIAGNOSIS — R972 Elevated prostate specific antigen [PSA]: Secondary | ICD-10-CM

## 2020-09-22 ENCOUNTER — Other Ambulatory Visit: Payer: Self-pay | Admitting: Internal Medicine

## 2020-09-22 ENCOUNTER — Telehealth: Payer: Self-pay | Admitting: Urology

## 2020-09-22 DIAGNOSIS — I10 Essential (primary) hypertension: Secondary | ICD-10-CM

## 2020-09-22 NOTE — Telephone Encounter (Signed)
Pt wants to wait to schedule biopsy until he gets his Medicare supplement.  She wants to call back to get biopsy scheduled.

## 2020-09-22 NOTE — Telephone Encounter (Signed)
I will cancel biopsy in the OR and await for return call to reschedule.

## 2020-10-04 ENCOUNTER — Ambulatory Visit: Admit: 2020-10-04 | Payer: Medicare Other | Admitting: Urology

## 2020-10-04 ENCOUNTER — Other Ambulatory Visit: Payer: Self-pay

## 2020-10-04 DIAGNOSIS — R972 Elevated prostate specific antigen [PSA]: Secondary | ICD-10-CM

## 2020-10-04 DIAGNOSIS — R3989 Other symptoms and signs involving the genitourinary system: Secondary | ICD-10-CM

## 2020-10-04 SURGERY — ULTRASOUND, RECTAL APPROACH
Anesthesia: Monitor Anesthesia Care

## 2020-10-04 NOTE — Progress Notes (Signed)
[facility] Posting Form    Inpt ( No  )   Outpt (Yes)   Obs ( No  )   Diagnosis: elevated PSA  -CPT: [id number]  Surgery:  TRUS (1192)  -CPT: 5570  Surgery:  Prostate Biopsy [id number])   Surgeon: John Giovanni, MD  Surgery Date/Time: Date: 10/24/20  Surgery Location: Day Surgery   Cardiac/Medical/Pulmonary Clearance needed: No  - Pre-op UA & CX : 10/12/20   -Hold Anticoags : patient not currently taking    -Fleets Enema morning of procedure  *Orders entered into EPIC  Date: 10/04/20   *Case booked in EPIC  Date: 10/04/20  *Notified pt of Surgery: Date: 10/04/20   *Placed into Prior Authorization Work Fabio Bering Date: 10/04/20   Assistant/laser/rep:Yes Kim Deal in ultrasound confirmed

## 2020-10-05 ENCOUNTER — Other Ambulatory Visit: Payer: Self-pay

## 2020-10-12 ENCOUNTER — Other Ambulatory Visit: Payer: BC Managed Care – PPO

## 2020-10-12 ENCOUNTER — Other Ambulatory Visit: Payer: Self-pay

## 2020-10-12 DIAGNOSIS — C61 Malignant neoplasm of prostate: Secondary | ICD-10-CM

## 2020-10-12 DIAGNOSIS — R972 Elevated prostate specific antigen [PSA]: Secondary | ICD-10-CM | POA: Diagnosis not present

## 2020-10-12 HISTORY — DX: Malignant neoplasm of prostate: C61

## 2020-10-13 ENCOUNTER — Ambulatory Visit (INDEPENDENT_AMBULATORY_CARE_PROVIDER_SITE_OTHER): Payer: BC Managed Care – PPO | Admitting: Internal Medicine

## 2020-10-13 ENCOUNTER — Encounter: Payer: Self-pay | Admitting: Internal Medicine

## 2020-10-13 VITALS — BP 110/50 | HR 69 | Temp 98.1°F | Ht 68.0 in | Wt 179.0 lb

## 2020-10-13 DIAGNOSIS — I10 Essential (primary) hypertension: Secondary | ICD-10-CM

## 2020-10-13 DIAGNOSIS — F17201 Nicotine dependence, unspecified, in remission: Secondary | ICD-10-CM | POA: Diagnosis not present

## 2020-10-13 DIAGNOSIS — E118 Type 2 diabetes mellitus with unspecified complications: Secondary | ICD-10-CM | POA: Diagnosis not present

## 2020-10-13 DIAGNOSIS — F331 Major depressive disorder, recurrent, moderate: Secondary | ICD-10-CM

## 2020-10-13 LAB — URINALYSIS, COMPLETE
Bilirubin, UA: NEGATIVE
Ketones, UA: NEGATIVE
Leukocytes,UA: NEGATIVE
Nitrite, UA: NEGATIVE
Protein,UA: NEGATIVE
RBC, UA: NEGATIVE
Specific Gravity, UA: 1.01 (ref 1.005–1.030)
Urobilinogen, Ur: 0.2 mg/dL (ref 0.2–1.0)
pH, UA: 5.5 (ref 5.0–7.5)

## 2020-10-13 LAB — MICROSCOPIC EXAMINATION
Bacteria, UA: NONE SEEN
Epithelial Cells (non renal): NONE SEEN /hpf (ref 0–10)
WBC, UA: NONE SEEN /hpf (ref 0–5)

## 2020-10-13 LAB — POCT GLYCOSYLATED HEMOGLOBIN (HGB A1C): Hemoglobin A1C: 9.1 % — AB (ref 4.0–5.6)

## 2020-10-13 NOTE — Progress Notes (Signed)
Date:  10/13/2020   Name:  Calvin Sanders   DOB:  01-13-1956   MRN:  [id number]   Chief Complaint: Depression, Diabetes, and Hypertension  Diabetes He presents for his follow-up diabetic visit. He has type 2 diabetes mellitus. His disease course has been worsening (A1C up to 8.8 last visit). Pertinent negatives for hypoglycemia include no headaches or tremors. Pertinent negatives for diabetes include no chest pain, no fatigue, no polydipsia and no polyuria. Current diabetic treatment includes oral agent (triple therapy) (farxiga, metformin, glimepiride). He is compliant with treatment all of the time. An ACE inhibitor/angiotensin II receptor blocker is being taken. Eye exam is not current.  Depression        This is a chronic problem.The problem is unchanged.  Associated symptoms include no fatigue, no appetite change and no headaches. Hypertension This is a chronic problem. The problem is controlled. Pertinent negatives include no chest pain, headaches, palpitations or shortness of breath. Past treatments include diuretics and angiotensin blockers. The current treatment provides significant improvement.   Lab Results  Component Value Date   CREATININE 1.05 06/21/2020   BUN 13 06/21/2020   NA 140 06/21/2020   K 4.2 06/21/2020   CL 98 06/21/2020   CO2 23 06/21/2020   Lab Results  Component Value Date   CHOL 175 06/21/2020   HDL 41 06/21/2020   LDLCALC 108 (H) 06/21/2020   TRIG 144 06/21/2020   CHOLHDL 4.3 06/21/2020   Lab Results  Component Value Date   TSH 1.560 06/21/2020   Lab Results  Component Value Date   HGBA1C 8.8 (H) 06/21/2020   Lab Results  Component Value Date   WBC 6.8 06/21/2020   HGB 16.0 06/21/2020   HCT 44.3 06/21/2020   MCV 94 06/21/2020   PLT 233 06/21/2020   Lab Results  Component Value Date   ALT 30 06/21/2020   AST 21 06/21/2020   ALKPHOS 94 06/21/2020   BILITOT 0.9 06/21/2020     Review of Systems  Constitutional:  Negative for  appetite change, fatigue and unexpected weight change.  Eyes:  Negative for visual disturbance.  Respiratory:  Negative for cough, shortness of breath and wheezing.   Cardiovascular:  Negative for chest pain, palpitations and leg swelling.  Gastrointestinal:  Negative for abdominal pain and blood in stool.  Endocrine: Negative for polydipsia and polyuria.  Genitourinary:  Negative for dysuria and hematuria.  Skin:  Negative for color change and rash.  Neurological:  Negative for tremors, numbness and headaches.  Psychiatric/Behavioral:  Positive for depression. Negative for dysphoric mood.    Patient Active Problem List   Diagnosis Date Noted   MCI (mild cognitive impairment) 03/19/2017   Major depressive disorder, recurrent episode, moderate ([place]) 03/19/2017   B12 deficiency 05/22/2016   Vitamin D deficiency 02/08/2015   Erectile dysfunction of organic origin 09/06/2014   Family history of prostate cancer 09/06/2014   DM (diabetes mellitus), type 2 with complications ([place]) [id number]   Hyperlipidemia associated with type 2 diabetes mellitus ([place]) 08/05/2014   Tobacco use disorder, severe, in early remission 08/05/2014   Colon polyps 05/14/2014   Chronic prostatitis 05/14/2014   Essential hypertension 05/14/2014   BPH (benign prostatic hyperplasia) 05/14/2014    No Known Allergies  Past Surgical History:  Procedure Laterality Date   NO PAST SURGERIES      Social History   Tobacco Use   Smoking status: Every Day    Packs/day: 0.25    Years: 45.00  Pack years: 11.25    Types: Cigarettes    Start date: 02/11/1973   Smokeless tobacco: Never   Tobacco comments:    3 a day   Vaping Use   Vaping Use: Never used  Substance Use Topics   Alcohol use: Yes    Alcohol/week: 14.0 standard drinks    Types: 14 Cans of beer per week    Comment: back up to 1-2 beers per day   Drug use: No     Medication list has been reviewed and updated.  Current Meds  Medication Sig    acetaminophen (TYLENOL) 500 MG tablet Take 2 tablets (1,000 mg total) by mouth every 8 (eight) hours as needed.   atorvastatin (LIPITOR) 40 MG tablet TAKE 1 TABLET BY MOUTH DAILY   Cholecalciferol (VITAMIN D3) 2000 units TABS Take 2,000 Units by mouth daily.   citalopram (CELEXA) 20 MG tablet Take 1 tablet (20 mg total) by mouth daily.   Cyanocobalamin (B-12) 500 MCG TABS Take 1 tablet by mouth daily.   FARXIGA 10 MG TABS tablet TAKE 1 TABLET BY MOUTH EVERY DAY   glimepiride (AMARYL) 2 MG tablet Take 1 tablet (2 mg total) by mouth daily with breakfast.   losartan-hydrochlorothiazide (HYZAAR) 100-25 MG tablet TAKE 1 TABLET BY MOUTH EVERY DAY   metFORMIN (GLUCOPHAGE) 500 MG tablet TAKE 2 TABLETS BY MOUTH EVERY MORNING AND 1 TABLET IN THE EVENING   sildenafil (VIAGRA) 100 MG tablet Take 1 tablet (100 mg total) by mouth daily as needed for erectile dysfunction. Take two hours prior to intercourse on an empty stomach    PHQ 2/9 Scores 10/13/2020 06/21/2020 02/14/2020 11/17/2019  PHQ - 2 Score 0 0 0 0  PHQ- 9 Score 0 0 0 -    GAD 7 : Generalized Anxiety Score 10/13/2020 06/21/2020 02/14/2020 10/20/2019  Nervous, Anxious, on Edge 0 0 0 0  Control/stop worrying 0 0 0 0  Worry too much - different things 0 0 0 0  Trouble relaxing 0 0 0 0  Restless 0 0 0 0  Easily annoyed or irritable 0 0 0 0  Afraid - awful might happen 0 0 0 0  Total GAD 7 Score 0 0 0 0  Anxiety Difficulty - Not difficult at all - Not difficult at all    BP Readings from Last 3 Encounters:  10/13/20 (!) 110/50  08/30/20 118/64  08/21/20 (!) 144/70    Physical Exam Vitals and nursing note reviewed.  Constitutional:      General: He is not in acute distress.    Appearance: He is well-developed.  HENT:     Head: Normocephalic and atraumatic.  Cardiovascular:     Rate and Rhythm: Normal rate and regular rhythm.     Pulses: Normal pulses.  Pulmonary:     Effort: Pulmonary effort is normal. No respiratory distress.   Musculoskeletal:        General: Normal range of motion.     Right lower leg: No edema.     Left lower leg: No edema.  Skin:    General: Skin is warm and dry.     Findings: No rash.  Neurological:     Mental Status: He is alert and oriented to person, place, and time.  Psychiatric:        Mood and Affect: Mood normal.        Behavior: Behavior normal.    Wt Readings from Last 3 Encounters:  10/13/20 179 lb (81.2 kg)  08/30/20  175 lb (79.4 kg)  08/21/20 175 lb (79.4 kg)    BP (!) 110/50   Pulse 69   Temp 98.1 F (36.7 C) (Oral)   Ht '5\' 8"'$  (1.727 m)   Wt 179 lb (81.2 kg)   SpO2 96%   BMI 27.22 kg/m   Assessment and Plan: 1. DM (diabetes mellitus), type 2 with complications ([place]) BS is continuing to increase;  A1C up to 9.1 from 8.8. He admits to eating ice cream every evening, along with cookies, bread and pasta regularly. He discussed dietary changes that are needed. We discussed adding medication such as Ozempic but he would like to work on improving his diet first. He is reminded to schedule an eye exam. - POCT glycosylated hemoglobin (Hb A1C)  2. Essential hypertension Clinically stable exam with well controlled BP. Tolerating medications without side effects at this time. Pt to continue current regimen and low sodium diet; benefits of regular exercise as able discussed.  3. Major depressive disorder, recurrent episode, moderate ([facility]) Clinically stable on current regimen with good control of symptoms, No SI or HI. Will continue current therapy.  4. 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   Partially dictated using Editor, commissioning. Any errors are unintentional.  Halina Maidens, MD [facility]  10/13/2020

## 2020-10-13 NOTE — Patient Instructions (Addendum)
Schedule Diabetic eye exam.  Ask them to send me the visit note.  Cut out the ice cream at night.  Try substituting a sugar-free popcicle instead.

## 2020-10-16 LAB — CULTURE, URINE COMPREHENSIVE

## 2020-10-17 ENCOUNTER — Other Ambulatory Visit: Payer: Self-pay

## 2020-10-17 ENCOUNTER — Other Ambulatory Visit: Payer: Self-pay | Admitting: Internal Medicine

## 2020-10-17 ENCOUNTER — Other Ambulatory Visit: Payer: Self-pay | Admitting: Urology

## 2020-10-17 ENCOUNTER — Telehealth: Payer: Self-pay | Admitting: *Deleted

## 2020-10-17 ENCOUNTER — Encounter
Admission: RE | Admit: 2020-10-17 | Discharge: 2020-10-17 | Disposition: A | Discharge status: Home / Self Care | Payer: BC Managed Care – PPO | Source: Ambulatory Visit | Attending: Urology | Admitting: Urology

## 2020-10-17 DIAGNOSIS — E118 Type 2 diabetes mellitus with unspecified complications: Secondary | ICD-10-CM

## 2020-10-17 MED ORDER — CEFUROXIME AXETIL 250 MG PO TABS: 250.0000 mg | ORAL_TABLET | Freq: Two times a day (BID) | ORAL | 0 refills | Status: AC

## 2020-10-17 NOTE — Telephone Encounter (Signed)
Notified patient as instructed, patient pleased. Discussed follow-up appointments, patient agrees  

## 2020-10-17 NOTE — Patient Instructions (Signed)
Your procedure is scheduled on: Tues 9/13 Report to Day Surgery. Registration desk in medical mall To find out your arrival time please call [phone] between [place] on Mon 9/12.  Remember: Instructions that are not followed completely may result in serious medical risk,  up to and including death, or upon the discretion of your surgeon and anesthesiologist your  surgery may need to be rescheduled.     _X__ 1. Do not eat or drink afer midnight the night before your procedure.                 No chewing gum or hard candies.  __X__2.  On the morning of surgery brush your teeth with toothpaste and water, you                may rinse your mouth with mouthwash if you wish.  Do not swallow any toothpaste of mouthwash.     _X__ 3.  No Alcohol for 24 hours before or after surgery.   ___ 4.  Do Not Smoke or use e-cigarettes For 24 Hours Prior to Your Surgery.                 Do not use any chewable tobacco products for at least 6 hours prior to                 Surgery.  ___  5.  Do not use any recreational drugs (marijuana, cocaine, heroin, ecstasy,  MDMA or other) For at least one week prior to your surgery.     Combination of these drugs with anesthesia may have life threatening    results.  ____  6.  Bring all medications with you on the day of surgery if instructed.   __x__  7.  Notify your doctor if there is any change in your medical condition      (cold, fever, infections).     Do not wear jewelry,  Do not wear lotions,  You may wear deodorant. Do not shave 48 hours prior to surgery. Men may shave face and neck. Do not bring valuables to the hospital.    [facility] is not responsible for any belongings or valuables.  Contacts, dentures or bridgework may not be worn into surgery. Leave your suitcase in the car. After surgery it may be brought to your room. For patients admitted to the hospital, discharge time is determined by your treatment  team.   Patients discharged the day of surgery will not be allowed to drive home.   Make arrangements for someone to be with you for the first 24 hours of your Same Day Discharge.    Please read over the following fact sheets that you were given:       ___x_ Take these medicines the morning of surgery with A SIP OF WATER:    1.acetaminophen (TYLENOL) 500 MG tablet if needed  2. atorvastatin (LIPITOR) 40 MG tablet  3. citalopram (CELEXA) 20 MG tablet  4.  5.  6.  ____ Fleet Enema (as directed)   ___x_ shower the night before and morning of surgery   ____ Use Benzoyl Peroxide Gel as instructed  ____ Use inhalers on the day of surgery  __x__ Stop metformin 2 days prior to surgery last dose on 9/10          Farxiga Last dose on 9/9      No glimepiride the morning of surgery   ____ Take 1/2 of usual insulin  dose the night before surgery. No insulin the morning          of surgery.   ____ Stop Coumadin/Plavix/aspirin on   ____ Stop Anti-inflammatories on    ____ Stop supplements until after surgery.    ____ Bring C-Pap to the hospital.    If you have any questions regarding your pre-procedure instructions,  Please call Pre-admit Testing at [phone]

## 2020-10-17 NOTE — Telephone Encounter (Signed)
-----   Message from Abbie Sons, MD sent at 10/17/2020 10:02 AM EDT ----- Preop urine culture growing a low level of staph.  Antibiotic Rx was sent to pharmacy.  Please make sure he picks up the medication and takes as directed.

## 2020-10-18 ENCOUNTER — Other Ambulatory Visit
Admission: RE | Admit: 2020-10-18 | Discharge: 2020-10-18 | Disposition: A | Discharge status: Home / Self Care | Payer: BC Managed Care – PPO | Source: Ambulatory Visit | Attending: Urology | Admitting: Urology

## 2020-10-18 DIAGNOSIS — Z01810 Encounter for preprocedural cardiovascular examination: Secondary | ICD-10-CM | POA: Insufficient documentation

## 2020-10-18 NOTE — Telephone Encounter (Signed)
Requested Prescriptions  Pending Prescriptions Disp Refills  . metFORMIN (GLUCOPHAGE) 500 MG tablet [Pharmacy Med Name: METFORMIN HCL 500 MG TABLET] 90 tablet 5    Sig: TAKE 2 TABLETS BY MOUTH EVERY MORNING AND 1 TABLET IN THE EVENING     Endocrinology:  Diabetes - Biguanides Failed - 10/17/2020 10:32 AM      Failed - HBA1C is between 0 and 7.9 and within 180 days    Hemoglobin A1C  Date Value Ref Range Status  10/13/2020 9.1 (A) 4.0 - 5.6 % Final   Hgb A1c MFr Bld  Date Value Ref Range Status  06/21/2020 8.8 (H) 4.8 - 5.6 % Final    Comment:             Prediabetes: 5.7 - 6.4          Diabetes: >6.4          Glycemic control for adults with diabetes: <7.0          Passed - Cr in normal range and within 360 days    Creatinine, Ser  Date Value Ref Range Status  06/21/2020 1.05 0.76 - 1.27 mg/dL Final         Passed - eGFR in normal range and within 360 days    GFR calc Af Amer  Date Value Ref Range Status  06/16/2019 72 >59 mL/min/1.73 Final    Comment:    **[facility] currently reports eGFR in compliance with the current**   recommendations of the [employer]. [facility] will   update reporting as new guidelines are published from the NKF-ASN   Task force.    GFR calc non Af Amer  Date Value Ref Range Status  06/16/2019 62 >59 mL/min/1.73 Final   eGFR  Date Value Ref Range Status  06/21/2020 78 >59 mL/min/1.73 Final         Passed - Valid encounter within last 6 months    Recent Outpatient Visits          5 days ago DM (diabetes mellitus), type 2 with complications [facility])   [facility] Glean Hess, MD   3 months ago Annual physical exam   [facility] Glean Hess, MD   8 months ago Essential hypertension   [place], MD   12 months ago DM (diabetes mellitus), type 2 with complications [facility])   [facility] Glean Hess, MD   1 year ago Annual physical exam   [facility] Glean Hess, MD      Future Appointments            In 3 months Army Melia Jesse Sans, MD [facility], [place]   In 8 months Army Melia Jesse Sans, MD [facility], [facility]

## 2020-10-20 ENCOUNTER — Other Ambulatory Visit: Payer: Self-pay | Admitting: Urology

## 2020-10-24 ENCOUNTER — Encounter
Admission: RE | Disposition: A | Discharge status: Home / Self Care | Payer: Self-pay | Source: Home / Self Care | Attending: Urology

## 2020-10-24 ENCOUNTER — Ambulatory Visit: Payer: BC Managed Care – PPO | Admitting: Anesthesiology

## 2020-10-24 ENCOUNTER — Ambulatory Visit
Admission: RE | Admit: 2020-10-24 | Discharge: 2020-10-24 | Disposition: A | Discharge status: Home / Self Care | Payer: BC Managed Care – PPO | Attending: Urology | Admitting: Urology

## 2020-10-24 ENCOUNTER — Encounter: Payer: Self-pay | Admitting: Urology

## 2020-10-24 ENCOUNTER — Ambulatory Visit
Admission: RE | Admit: 2020-10-24 | Discharge: 2020-10-24 | Disposition: A | Discharge status: Home / Self Care | Payer: BC Managed Care – PPO | Source: Ambulatory Visit | Attending: Urology | Admitting: Urology

## 2020-10-24 DIAGNOSIS — I10 Essential (primary) hypertension: Secondary | ICD-10-CM | POA: Diagnosis not present

## 2020-10-24 DIAGNOSIS — Z79899 Other long term (current) drug therapy: Secondary | ICD-10-CM | POA: Insufficient documentation

## 2020-10-24 DIAGNOSIS — R972 Elevated prostate specific antigen [PSA]: Secondary | ICD-10-CM | POA: Insufficient documentation

## 2020-10-24 DIAGNOSIS — R6889 Other general symptoms and signs: Secondary | ICD-10-CM | POA: Diagnosis not present

## 2020-10-24 DIAGNOSIS — Z87891 Personal history of nicotine dependence: Secondary | ICD-10-CM | POA: Insufficient documentation

## 2020-10-24 DIAGNOSIS — R3989 Other symptoms and signs involving the genitourinary system: Secondary | ICD-10-CM

## 2020-10-24 DIAGNOSIS — Z7984 Long term (current) use of oral hypoglycemic drugs: Secondary | ICD-10-CM | POA: Insufficient documentation

## 2020-10-24 DIAGNOSIS — C61 Malignant neoplasm of prostate: Secondary | ICD-10-CM | POA: Diagnosis not present

## 2020-10-24 HISTORY — PX: PROSTATE BIOPSY: SHX241

## 2020-10-24 HISTORY — PX: TRANSRECTAL ULTRASOUND: SHX5146

## 2020-10-24 LAB — GLUCOSE, CAPILLARY
Glucose-Capillary: 262 mg/dL — ABNORMAL HIGH (ref 70–99)
Glucose-Capillary: 220 mg/dL — ABNORMAL HIGH (ref 70–99)

## 2020-10-24 SURGERY — ULTRASOUND, RECTAL APPROACH
Anesthesia: General

## 2020-10-24 MED ORDER — DEXAMETHASONE SODIUM PHOSPHATE 10 MG/ML IJ SOLN
INTRAMUSCULAR | Status: DC | PRN
  Administered 2020-10-24: 5 mg via INTRAVENOUS

## 2020-10-24 MED ORDER — CHLORHEXIDINE GLUCONATE 0.12 % MT SOLN
OROMUCOSAL | Status: AC
  Filled 2020-10-24: qty 15

## 2020-10-24 MED ORDER — LABETALOL HCL 5 MG/ML IV SOLN
INTRAVENOUS | Status: AC
  Administered 2020-10-24: 5 mg via INTRAVENOUS
  Filled 2020-10-24: qty 4

## 2020-10-24 MED ORDER — HYDRALAZINE HCL 20 MG/ML IJ SOLN
INTRAMUSCULAR | Status: AC
  Administered 2020-10-24: 5 mg via INTRAVENOUS
  Filled 2020-10-24: qty 1

## 2020-10-24 MED ORDER — PROPOFOL 500 MG/50ML IV EMUL
INTRAVENOUS | Status: DC | PRN
  Administered 2020-10-24: 145 ug/kg/min via INTRAVENOUS

## 2020-10-24 MED ORDER — PROPOFOL 10 MG/ML IV BOLUS
INTRAVENOUS | Status: DC | PRN
  Administered 2020-10-24 (×2): 30 mg via INTRAVENOUS
  Administered 2020-10-24 (×2): 50 mg via INTRAVENOUS

## 2020-10-24 MED ORDER — GLYCOPYRROLATE 0.2 MG/ML IJ SOLN
INTRAMUSCULAR | Status: DC | PRN
  Administered 2020-10-24 (×2): .2 mg via INTRAVENOUS

## 2020-10-24 MED ORDER — FAMOTIDINE 20 MG PO TABS
20.0000 mg | ORAL_TABLET | Freq: Once | ORAL | Status: AC
  Administered 2020-10-24: 20 mg via ORAL

## 2020-10-24 MED ORDER — FAMOTIDINE 20 MG PO TABS
ORAL_TABLET | ORAL | Status: AC
  Filled 2020-10-24: qty 1

## 2020-10-24 MED ORDER — CHLORHEXIDINE GLUCONATE 0.12 % MT SOLN
15.0000 mL | Freq: Once | OROMUCOSAL | Status: AC
  Administered 2020-10-24: 15 mL via OROMUCOSAL

## 2020-10-24 MED ORDER — ORAL CARE MOUTH RINSE: 15.0000 mL | Freq: Once | OROMUCOSAL | Status: AC

## 2020-10-24 MED ORDER — SODIUM CHLORIDE 0.9 % IV SOLN: INTRAVENOUS | Status: DC

## 2020-10-24 MED ORDER — HYDRALAZINE HCL 20 MG/ML IJ SOLN: 5.0000 mg | Freq: Once | INTRAMUSCULAR | Status: AC

## 2020-10-24 MED ORDER — LABETALOL HCL 5 MG/ML IV SOLN: 5.0000 mg | Freq: Once | INTRAVENOUS | Status: AC

## 2020-10-24 MED ORDER — ONDANSETRON HCL 4 MG/2ML IJ SOLN
INTRAMUSCULAR | Status: DC | PRN
Start: 2020-10-24 — End: 2020-10-24
  Administered 2020-10-24: 4 mg via INTRAVENOUS

## 2020-10-24 MED ORDER — SODIUM CHLORIDE 0.9 % IV SOLN
1.0000 g | INTRAVENOUS | Status: AC
  Administered 2020-10-24: 1 g via INTRAVENOUS
  Filled 2020-10-24: qty 1

## 2020-10-24 MED ORDER — ONDANSETRON HCL 4 MG/2ML IJ SOLN: 4.0000 mg | Freq: Once | INTRAMUSCULAR | Status: DC | PRN

## 2020-10-24 MED ORDER — FENTANYL CITRATE (PF) 100 MCG/2ML IJ SOLN
INTRAMUSCULAR | Status: AC
  Filled 2020-10-24: qty 2

## 2020-10-24 MED ORDER — FENTANYL CITRATE (PF) 100 MCG/2ML IJ SOLN: 25.0000 ug | INTRAMUSCULAR | Status: DC | PRN

## 2020-10-24 MED ORDER — MIDAZOLAM HCL 2 MG/2ML IJ SOLN
INTRAMUSCULAR | Status: AC
  Filled 2020-10-24: qty 2

## 2020-10-24 SURGICAL SUPPLY — 10 items
COVER MAYO STAND REUSABLE (DRAPES) ×2 IMPLANT
GAUZE SPONGE 4X4 12PLY STRL (GAUZE/BANDAGES/DRESSINGS) ×2 IMPLANT
GLOVE SURG UNDER POLY LF SZ7.5 (GLOVE) ×2 IMPLANT
GUIDE NEEDLE ENDOCAV 16-18 CVR (NEEDLE) IMPLANT
INST BIOPSY MAXCORE 18GX25 (NEEDLE) ×2 IMPLANT
KIT TURNOVER CYSTO (KITS) ×2 IMPLANT
MANIFOLD NEPTUNE II (INSTRUMENTS) ×2 IMPLANT
NEEDLE GUIDE BIOPSY 644068 (NEEDLE) ×2 IMPLANT
SURGILUBE 2OZ TUBE FLIPTOP (MISCELLANEOUS) ×2 IMPLANT
WATER STERILE IRR 500ML POUR (IV SOLUTION) ×2 IMPLANT

## 2020-10-24 NOTE — Anesthesia Preprocedure Evaluation (Signed)
Anesthesia Evaluation  Patient identified by MRN, date of birth, ID band Patient awake    Reviewed: Allergy & Precautions, NPO status , Patient's Chart, lab work & pertinent test results  History of Anesthesia Complications Negative for: history of anesthetic complications  Airway Mallampati: II  TM Distance: >3 FB Neck ROM: Full    Dental no notable dental hx.    Pulmonary neg sleep apnea, neg COPD, former smoker,    breath sounds clear to auscultation- rhonchi (-) wheezing      Cardiovascular hypertension, Pt. on medications (-) CAD, (-) Past MI, (-) Cardiac Stents and (-) CABG  Rhythm:Regular Rate:Normal - Systolic murmurs and - Diastolic murmurs    Neuro/Psych neg Seizures PSYCHIATRIC DISORDERS Depression negative neurological ROS     GI/Hepatic negative GI ROS, Neg liver ROS,   Endo/Other  diabetes, Oral Hypoglycemic Agents  Renal/GU negative Renal ROS     Musculoskeletal negative musculoskeletal ROS (+)   Abdominal (+) - obese,   Peds  Hematology negative hematology ROS (+)   Anesthesia Other Findings Past Medical History: 08/05/2014: Alcohol dependence, daily use (HCC)     Comment:  Previously 6-8 beers per day, cut back in 2020 to 2 per               day; in 09/2018 he is only drinking 2 beers on Sat and Sun No date: Diabetes mellitus without complication (HCC) No date: Hypertension   Reproductive/Obstetrics                             Anesthesia Physical Anesthesia Plan  ASA: 3  Anesthesia Plan: General   Post-op Pain Management:    Induction: Intravenous  PONV Risk Score and Plan: 1 and Propofol infusion  Airway Management Planned: Natural Airway  Additional Equipment:   Intra-op Plan:   Post-operative Plan:   Informed Consent: I have reviewed the patients History and Physical, chart, labs and discussed the procedure including the risks, benefits and  alternatives for the proposed anesthesia with the patient or authorized representative who has indicated his/her understanding and acceptance.     Dental advisory given  Plan Discussed with: CRNA and Anesthesiologist  Anesthesia Plan Comments:         Anesthesia Quick Evaluation

## 2020-10-24 NOTE — Interval H&P Note (Signed)
History and Physical Interval Note:  10/24/2020 10:13 AM  Calvin Sanders  has presented today for surgery, with the diagnosis of Abnormal PSA.  The various methods of treatment have been discussed with the patient and family. After consideration of risks, benefits and other options for treatment, the patient has consented to  Procedure(s) with comments: TRANSRECTAL ULTRASOUND (N/A) - Confirmed with Kim in Ultrasound PROSTATE BIOPSY (N/A) as a surgical intervention.  The patient's history has been reviewed, patient examined, no change in status, stable for surgery.  I have reviewed the patient's chart and labs.  Questions were answered to the patient's satisfaction.     [place]

## 2020-10-24 NOTE — Anesthesia Postprocedure Evaluation (Signed)
Anesthesia Post Note  Patient: Calvin Sanders  Procedure(s) Performed: TRANSRECTAL ULTRASOUND PROSTATE BIOPSY  Patient location during evaluation: PACU Anesthesia Type: General Level of consciousness: awake and alert and oriented Pain management: pain level controlled Vital Signs Assessment: post-procedure vital signs reviewed and stable Respiratory status: spontaneous breathing, nonlabored ventilation and respiratory function stable Cardiovascular status: blood pressure returned to baseline and stable Postop Assessment: no signs of nausea or vomiting Anesthetic complications: no   No notable events documented.   Last Vitals:  Vitals:   10/24/20 1140 10/24/20 1154  BP: (!) 193/86 (!) 175/78  Pulse: (!) 57 62  Resp: 16 16  Temp:    SpO2: 98% 98%    Last Pain:  Vitals:   10/24/20 1112  TempSrc:   PainSc: 3                  [place]ken B[place]

## 2020-10-24 NOTE — Anesthesia Procedure Notes (Addendum)
Procedure Name: General with mask airway Date/Time: 10/24/2020 10:43 AM Performed by: Kelton Pillar, CRNA Pre-anesthesia Checklist: Patient identified, Emergency Drugs available, Suction available and Patient being monitored Patient Re-evaluated:Patient Re-evaluated prior to induction Induction Type: IV induction Placement Confirmation: positive ETCO2 and CO2 detector Dental Injury: Teeth and Oropharynx as per pre-operative assessment

## 2020-10-24 NOTE — Op Note (Signed)
Preoperative diagnosis:  Abnormal DRE Abnormal PSA velocity  Postoperative diagnosis:  Same  Procedure: TRUS/prostate Transrectal prostate biopsy  Surgeon: Abbie Sons, MD  Anesthesia: MAC  Complications: None  Intraoperative findings: Prostate volume 47 g.  Hypoechoic area left PZ at base  EBL: Minimal  Specimens: None  Indication: Calvin Sanders is a 65 y.o. with history of an abnormal DRE and abnormal PSA velocity.  He was unable to tolerate insertion of the transrectal ultrasound probe at scheduled office biopsy and presents for biopsy under sedation.  He elected to proceed with the above surgical procedure(s). We have discussed the potential benefits and risks of the procedure, side effects of the proposed treatment, the likelihood of the patient achieving the goals of the procedure, and any potential problems that might occur during the procedure or recuperation. Informed consent has been obtained.  Description of procedure:  The patient was taken to the operating room and was not transferred to the operating table.  He was kept on the gurney and placed in the left lateral decubitus position.  IV sedation was obtained by anesthesia.  Preoperative antibiotics were administered. A preoperative time-out was performed.   A transrectal sound probe was lubricated and passed per rectum.  Ultrasound was performed in the transverse and sagittal planes with findings as described above.  Standard 12 core biopsy was then performed in the usual fashion.  The left cores included the observed hypoechoic area noted in the peripheral zone.  No significant bleeding was noted and he was transferred to the PACU in stable condition  Plan: He will be contacted with the pathology results   Abbie Sons, M.D.

## 2020-10-24 NOTE — Discharge Instructions (Addendum)
Discharge instructions  Blood from the rectum should resolve within 12-24 hours Blood in the urine can be intermittent for 1-2 weeks Blood in the semen can persist for 8-12 weeks Please contact [place] or proceed to the ED for excessive bleeding from the rectum or urine; fever greater than 101 degrees or urinary problems You will be contacted with your biopsy results and follow-up recommendations at that time  [place]   The drugs that you were given will stay in your system until tomorrow so for the next 24 hours you should not:  Drive an automobile Make any legal decisions Drink any alcoholic beverage   You may resume regular meals tomorrow.  Today it is better to start with liquids and gradually work up to solid foods.  You may eat anything you prefer, but it is better to start with liquids, then soup and crackers, and gradually work up to solid foods.   Please notify your doctor immediately if you have any unusual bleeding, trouble breathing, redness and pain at the surgery site, drainage, fever, or pain not relieved by medication.    Additional Instructions:     Please contact your physician with any problems or Same Day Surgery at [phone], Monday through Friday 6 am to 4 pm, or [place] at [facility] number at [phone].

## 2020-10-24 NOTE — H&P (Signed)
   Urology H&P   History of Present Illness: Calvin Sanders is a 65 y.o. male with an abnormal PSA velocity.  He underwent an attempt at transrectal prostate biopsy in the office July 2022 however the rectal probe was unable to be inserted secondary to discomfort.  He presents for TRUS/biopsy of the prostate under sedation  Past Medical History:  Diagnosis Date   Alcohol dependence, daily use ([place]) 08/05/2014   Previously 6-8 beers per day, cut back in 2020 to 2 per day; in 09/2018 he is only drinking 2 beers on Sat and Sun   Diabetes mellitus without complication ([place])    Hypertension     Past Surgical History:  Procedure Laterality Date   NO PAST SURGERIES      Home Medications:  Current Meds  Medication Sig   acetaminophen (TYLENOL) 500 MG tablet Take 2 tablets (1,000 mg total) by mouth every 8 (eight) hours as needed.   atorvastatin (LIPITOR) 40 MG tablet TAKE 1 TABLET BY MOUTH DAILY   Cholecalciferol (VITAMIN D3) 2000 units TABS Take 2,000 Units by mouth daily.   citalopram (CELEXA) 20 MG tablet Take 1 tablet (20 mg total) by mouth daily.   Cyanocobalamin (B-12) 500 MCG TABS Take 1 tablet by mouth daily.   FARXIGA 10 MG TABS tablet TAKE 1 TABLET BY MOUTH EVERY DAY   glimepiride (AMARYL) 2 MG tablet Take 1 tablet (2 mg total) by mouth daily with breakfast.   losartan-hydrochlorothiazide (HYZAAR) 100-25 MG tablet TAKE 1 TABLET BY MOUTH EVERY DAY   metFORMIN (GLUCOPHAGE) 500 MG tablet TAKE 2 TABLETS BY MOUTH EVERY MORNING AND 1 TABLET IN THE EVENING   [DISCONTINUED] metFORMIN (GLUCOPHAGE) 500 MG tablet TAKE 2 TABLETS BY MOUTH EVERY MORNING AND 1 TABLET IN THE EVENING    Allergies: No Known Allergies  Family History  Problem Relation Age of Onset   Diabetes Mother    Cancer Father        bone   Heart disease Brother    Prostate cancer Brother    Kidney cancer Neg Hx    Kidney disease Neg Hx    Bladder Cancer Neg Hx     Social History:  reports that he quit smoking  about 6 weeks ago. His smoking use included cigarettes. He started smoking about 47 years ago. He has a 11.25 pack-year smoking history. He has never used smokeless tobacco. He reports current alcohol use of about 14.0 standard drinks per week. He reports that he does not use drugs.  ROS: Otherwise noncontributory except as per the HPI  Physical Exam:  Vital signs in last 24 hours: Temp:  [97.2 F (36.2 C)] 97.2 F (36.2 C) (09/13 0845) Pulse Rate:  [52] 52 (09/13 0845) Resp:  [14] 14 (09/13 0845) BP: (176)/(72) 176/72 (09/13 0845) SpO2:  [98 %] 98 % (09/13 0845) Weight:  [81.2 kg] 81.2 kg (09/13 0845) Constitutional:  Alert and oriented, No acute distress HEENT: [place] AT, moist mucus membranes.  Trachea midline, no masses Cardiovascular: Regular rate and rhythm, no clubbing, cyanosis, or edema. Respiratory: Normal respiratory effort, lungs clear bilaterally   Impression/Plan:  TRUS/biopsy prostate under sedation The procedure was again discussed including potential risks of bleeding and infection/sepsis.  All questions were answered and he desires to proceed    10/24/2020, 10:10 AM  John Giovanni,  MD

## 2020-10-24 NOTE — Transfer of Care (Signed)
Immediate Anesthesia Transfer of Care Note  Patient: Calvin Sanders  Procedure(s) Performed: TRANSRECTAL ULTRASOUND PROSTATE BIOPSY  Patient Location: PACU  Anesthesia Type:General  Level of Consciousness: awake, drowsy and patient cooperative  Airway & Oxygen Therapy: Patient Spontanous Breathing and Patient connected to face mask oxygen  Post-op Assessment: Report given to RN and Post -op Vital signs reviewed and stable  Post vital signs: Reviewed and stable  Last Vitals:  Vitals Value Taken Time  BP    Temp    Pulse    Resp    SpO2      Last Pain:  Vitals:   10/24/20 0845  TempSrc: Tympanic  PainSc: 0-No pain         Complications: No notable events documented.

## 2020-10-26 LAB — SURGICAL PATHOLOGY

## 2020-10-27 ENCOUNTER — Encounter: Payer: Self-pay | Admitting: Urology

## 2020-11-01 ENCOUNTER — Encounter: Payer: Self-pay | Admitting: Urology

## 2020-11-01 ENCOUNTER — Ambulatory Visit (INDEPENDENT_AMBULATORY_CARE_PROVIDER_SITE_OTHER): Payer: BC Managed Care – PPO | Admitting: Urology

## 2020-11-01 ENCOUNTER — Other Ambulatory Visit: Payer: Self-pay

## 2020-11-01 VITALS — BP 136/76 | HR 68 | Ht 74.0 in | Wt 179.0 lb

## 2020-11-01 DIAGNOSIS — C61 Malignant neoplasm of prostate: Secondary | ICD-10-CM | POA: Diagnosis not present

## 2020-11-01 NOTE — Progress Notes (Signed)
11/01/2020 6:40 AM   Calvin Sanders 03/01/1955 [id number]  Referring provider: Glean Hess, MD [address] [place],  [place] [address]  Chief Complaint  Patient presents with   Other    HPI: 65 y.o. male presents for prostate biopsy follow-up.  Biopsy performed under sedation at [facility] 10/24/2020 TRUS with prostate volume 47 g with hypoechoic left PZ at base Standard 12 core biopsies obtained including cores from the hypoechoic area 4/12 cores positive for Gleason 4+3/3+3 adenocarcinoma     PMH: Past Medical History:  Diagnosis Date   Alcohol dependence, daily use ([place]) 08/05/2014   Previously 6-8 beers per day, cut back in 2020 to 2 per day; in 09/2018 he is only drinking 2 beers on Sat and Sun   Diabetes mellitus without complication ([place])    Hypertension     Surgical History: Past Surgical History:  Procedure Laterality Date   NO PAST SURGERIES     PROSTATE BIOPSY N/A 10/24/2020   Procedure: PROSTATE BIOPSY;  Surgeon: Abbie Sons, MD;  Location: [facility] ORS;  Service: Urology;  Laterality: N/A;   TRANSRECTAL ULTRASOUND N/A 10/24/2020   Procedure: TRANSRECTAL ULTRASOUND;  Surgeon: Abbie Sons, MD;  Location: [facility] ORS;  Service: Urology;  Laterality: N/A;  Confirmed with Kim in Ultrasound    Home Medications:  Allergies as of 11/01/2020   No Known Allergies      Medication List        Accurate as of November 01, 2020 11:59 PM. If you have any questions, ask your nurse or doctor.          acetaminophen 500 MG tablet Commonly known as: TYLENOL Take 2 tablets (1,000 mg total) by mouth every 8 (eight) hours as needed.   atorvastatin 40 MG tablet Commonly known as: LIPITOR TAKE 1 TABLET BY MOUTH DAILY   B-12 500 MCG Tabs Take 1 tablet by mouth daily.   citalopram 20 MG tablet Commonly known as: CELEXA Take 1 tablet (20 mg total) by mouth daily.   Farxiga 10 MG Tabs tablet Generic drug: dapagliflozin propanediol TAKE 1  TABLET BY MOUTH EVERY DAY   glimepiride 2 MG tablet Commonly known as: AMARYL Take 1 tablet (2 mg total) by mouth daily with breakfast.   losartan-hydrochlorothiazide 100-25 MG tablet Commonly known as: HYZAAR TAKE 1 TABLET BY MOUTH EVERY DAY   metFORMIN 500 MG tablet Commonly known as: GLUCOPHAGE TAKE 2 TABLETS BY MOUTH EVERY MORNING AND 1 TABLET IN THE EVENING   sildenafil 100 MG tablet Commonly known as: VIAGRA Take 1 tablet (100 mg total) by mouth daily as needed for erectile dysfunction. Take two hours prior to intercourse on an empty stomach   Vitamin D3 50 MCG (2000 UT) Tabs Take 2,000 Units by mouth daily.        Allergies: No Known Allergies  Family History: Family History  Problem Relation Age of Onset   Diabetes Mother    Cancer Father        bone   Heart disease Brother    Prostate cancer Brother    Kidney cancer Neg Hx    Kidney disease Neg Hx    Bladder Cancer Neg Hx     Social History:  reports that he quit smoking about 7 weeks ago. His smoking use included cigarettes. He started smoking about 47 years ago. He has a 11.25 pack-year smoking history. He has never used smokeless tobacco. He reports current alcohol use of about 14.0 standard drinks per  week. He reports that he does not use drugs.   Physical Exam: BP 136/76   Pulse 68   Ht 6\' 2"  (1.88 m)   Wt 179 lb (81.2 kg)   BMI 22.98 kg/m   Constitutional:  Alert and oriented, No acute distress. HEENT: [place] AT, moist mucus membranes.  Trachea midline, no masses. Cardiovascular: No clubbing, cyanosis, or edema.   Assessment & Plan:    Clinical T2 adenocarcinoma prostate Intermediate unfavorable risk The pathology report was discussed in detail Schedule CT abdomen/pelvis and bone scan for staging If no evidence of metastatic disease he does have a >10-year life expectancy and options of radical prostatectomy and radiation modalities were discussed.  We discussed there is not considered a "best"  treatment option for prostate cancer and that both of these treatments are effective.  We did discuss the slightly higher incidence of recurrence of radiation over surgery after 10-15 years. He was provided additional literature on prostate cancer treatment options He will be notified with his CT/bone scan results and we will discuss treatment options further at that time    Abbie Sons, MD  [place] [address], [place], [place] [address] [phone]

## 2020-11-02 ENCOUNTER — Encounter: Payer: Self-pay | Admitting: Urology

## 2020-11-05 ENCOUNTER — Other Ambulatory Visit: Payer: Self-pay | Admitting: Internal Medicine

## 2020-11-05 DIAGNOSIS — E118 Type 2 diabetes mellitus with unspecified complications: Secondary | ICD-10-CM

## 2020-11-13 ENCOUNTER — Other Ambulatory Visit: Payer: Self-pay | Admitting: Physician Assistant

## 2020-11-13 ENCOUNTER — Other Ambulatory Visit: Payer: Self-pay | Admitting: Internal Medicine

## 2020-11-13 DIAGNOSIS — E1169 Type 2 diabetes mellitus with other specified complication: Secondary | ICD-10-CM

## 2020-11-13 DIAGNOSIS — E785 Hyperlipidemia, unspecified: Secondary | ICD-10-CM

## 2020-11-20 ENCOUNTER — Ambulatory Visit (INDEPENDENT_AMBULATORY_CARE_PROVIDER_SITE_OTHER): Payer: BC Managed Care – PPO

## 2020-11-20 DIAGNOSIS — Z0000 Encounter for general adult medical examination without abnormal findings: Secondary | ICD-10-CM

## 2020-11-20 NOTE — Progress Notes (Signed)
Subjective:   Bhavin Monjaraz is a 65 y.o. male who presents for Medicare Annual/Subsequent preventive examination.  Virtual Visit via Video Note  I connected with  [name] on 11/20/20 at 10:40 AM EDT via telehealth video enabled device and verified that I am speaking with the correct person using two identifiers.  Location: Patient: home Provider: [facility] Persons participating in the virtual visit: [place]   I discussed the limitations, risks, security and privacy concerns of performing an evaluation and management service by telephone and the availability of in person appointments. The patient expressed understanding and agreed to proceed.  Some vital signs may be absent or patient reported.   Criselda Peaches, LPN  Review of Systems    Cardiac Risk Factors include: advanced age (>64men, >35 women);diabetes mellitus;male gender;dyslipidemia;hypertension     Objective:    There were no vitals filed for this visit. There is no height or weight on file to calculate BMI.  Advanced Directives 11/20/2020 10/24/2020 10/17/2020 11/17/2019 05/08/2015 09/05/2014  Does Patient Have a Medical Advance Directive? No No No No No No  Would patient like information on creating a medical advance directive? No - Patient declined No - Patient declined No - Patient declined No - Patient declined - No - patient declined information    Current Medications (verified) Outpatient Encounter Medications as of 11/20/2020  Medication Sig   acetaminophen (TYLENOL) 500 MG tablet Take 2 tablets (1,000 mg total) by mouth every 8 (eight) hours as needed.   atorvastatin (LIPITOR) 40 MG tablet TAKE 1 TABLET BY MOUTH DAILY   Cholecalciferol (VITAMIN D3) 2000 units TABS Take 2,000 Units by mouth daily.   citalopram (CELEXA) 20 MG tablet Take 1 tablet (20 mg total) by mouth daily.   Cyanocobalamin (B-12) 500 MCG TABS Take 1 tablet by mouth daily.   FARXIGA 10 MG TABS tablet TAKE 1 TABLET BY  MOUTH EVERY DAY   glimepiride (AMARYL) 2 MG tablet Take 1 tablet (2 mg total) by mouth daily with breakfast.   losartan-hydrochlorothiazide (HYZAAR) 100-25 MG tablet TAKE 1 TABLET BY MOUTH EVERY DAY   metFORMIN (GLUCOPHAGE) 500 MG tablet TAKE 2 TABLETS BY MOUTH EVERY MORNING AND 1 TABLET IN THE EVENING   sildenafil (VIAGRA) 100 MG tablet Take 1 tablet (100 mg total) by mouth daily as needed for erectile dysfunction. Take two hours prior to intercourse on an empty stomach   No facility-administered encounter medications on file as of 11/20/2020.    Allergies (verified) Patient has no known allergies.   History: Past Medical History:  Diagnosis Date   Alcohol dependence, daily use ([place]) 08/05/2014   Previously 6-8 beers per day, cut back in 2020 to 2 per day; in 09/2018 he is only drinking 2 beers on Sat and Sun   Diabetes mellitus without complication ([place])    Hypertension    Past Surgical History:  Procedure Laterality Date   NO PAST SURGERIES     PROSTATE BIOPSY N/A 10/24/2020   Procedure: PROSTATE BIOPSY;  Surgeon: Abbie Sons, MD;  Location: [facility] ORS;  Service: Urology;  Laterality: N/A;   TRANSRECTAL ULTRASOUND N/A 10/24/2020   Procedure: TRANSRECTAL ULTRASOUND;  Surgeon: Abbie Sons, MD;  Location: [facility] ORS;  Service: Urology;  Laterality: N/A;  Confirmed with Kim in Ultrasound   Family History  Problem Relation Age of Onset   Diabetes Mother    Cancer Father        bone   Heart disease Brother  Prostate cancer Brother    Kidney cancer Neg Hx    Kidney disease Neg Hx    Bladder Cancer Neg Hx    Social History   Socioeconomic History   Marital status: Married    Spouse name: Not on file   Number of children: Not on file   Years of education: Not on file   Highest education level: Not on file  Occupational History   Not on file  Tobacco Use   Smoking status: Former    Packs/day: 0.25    Years: 45.00    Pack years: 11.25    Types: Cigarettes    Start  date: 02/11/1973    Quit date: 09/11/2020    Years since quitting: 0.1   Smokeless tobacco: Never   Tobacco comments:    3 a day   Vaping Use   Vaping Use: Never used  Substance and Sexual Activity   Alcohol use: Yes    Alcohol/week: 14.0 standard drinks    Types: 14 Cans of beer per week    Comment: back up to 1-2 beers per day   Drug use: No   Sexual activity: Yes    Partners: Female  Other Topics Concern   Not on file  Social History Narrative   Not on file   Social Determinants of Health   Financial Resource Strain: Low Risk    Difficulty of Paying Living Expenses: Not hard at all  Food Insecurity: No Food Insecurity   Worried About Charity fundraiser in the Last Year: Never true   [place] in the Last Year: Never true  Transportation Needs: No Transportation Needs   Lack of Transportation (Medical): No   Lack of Transportation (Non-Medical): No  Physical Activity: Inactive   Days of Exercise per Week: 0 days   Minutes of Exercise per Session: 0 min  Stress: No Stress Concern Present   Feeling of Stress : Not at all  Social Connections: Moderately Isolated   Frequency of Communication with Friends and Family: More than three times a week   Frequency of Social Gatherings with Friends and Family: Once a week   Attends Religious Services: Never   Marine scientist or Organizations: No   Attends Music therapist: Never   Marital Status: Married    Tobacco Counseling Counseling given: Not Answered Tobacco comments: 3 a day    Clinical Intake:  Pre-visit preparation completed: Yes  Pain : No/denies pain     Nutritional Risks: None Diabetes: Yes CBG done?: No Did pt. bring in CBG monitor from home?: No  How often do you need to have someone help you when you read instructions, pamphlets, or other written materials from your doctor or pharmacy?: 1 - Never  Nutrition Risk Assessment:  Has the patient had any N/V/D within the last 2  months?  No  Does the patient have any non-healing wounds?  No  Has the patient had any unintentional weight loss or weight gain?  No   Diabetes:  Is the patient diabetic?  Yes  If diabetic, was a CBG obtained today?  No  Did the patient bring in their glucometer from home?  No  How often do you monitor your CBG's? Pt does not actively check blood sugar.   Financial Strains and Diabetes Management:  Are you having any financial strains with the device, your supplies or your medication? No .  Does the patient want to be seen by Chronic  Care Management for management of their diabetes?  No  Would the patient like to be referred to a Nutritionist or for Diabetic Management?  No   Diabetic Exams:  Diabetic Eye Exam: Completed per patient 2022 [facility]; will request records.   Diabetic Foot Exam: Completed 06/21/20.   Interpreter Needed?: No  Information entered by :: Rolene Arbour LPN   Activities of Daily Living In your present state of health, do you have any difficulty performing the following activities: 11/20/2020 10/17/2020  Hearing? N N  Vision? N N  Difficulty concentrating or making decisions? N N  Walking or climbing stairs? N N  Dressing or bathing? N N  Doing errands, shopping? N N  Preparing Food and eating ? N -  Using the Toilet? N -  In the past six months, have you accidently leaked urine? N -  Do you have problems with loss of bowel control? N -  Managing your Medications? N -  Managing your Finances? N -  Housekeeping or managing your Housekeeping? N -  Some recent data might be hidden    Patient Care Team: Glean Hess, MD as PCP - General (Internal Medicine) Nori Riis, PA-C as Physician Assistant (Urology)  Indicate any recent Medical Services you may have received from other than [facility] providers in the past year (date may be approximate).     Assessment:   This is a routine wellness examination for  [place].  Hearing/Vision screen Hearing Screening - Comments:: Pt denies hearing difficulty Vision Screening - Comments:: [facility] vision screen[facility]  Dietary issues and exercise activities discussed: Current Exercise Habits: The patient has a physically strenuous job, but has no regular exercise apart from work., Exercise limited by: None identified   Goals Addressed             This Visit's Progress    Patient Stated   Not on track    Recommend lowering A1c to goal of less than 7% with healthy eating and physical activity       Depression Screen PHQ 2/9 Scores 11/20/2020 10/13/2020 06/21/2020 02/14/2020 11/17/2019 10/20/2019 06/16/2019  PHQ - 2 Score 0 0 0 0 0 0 0  PHQ- 9 Score - 0 0 0 - 0 0    Fall Risk Fall Risk  11/20/2020 10/13/2020 06/21/2020 02/14/2020 11/17/2019  Falls in the past year? 0 0 0 0 0  Number falls in past yr: 0 0 0 - 0  Injury with Fall? 0 0 0 - 0  Risk for fall due to : No Fall Risks No Fall Risks - - No Fall Risks  Follow up Falls prevention discussed Falls evaluation completed Falls evaluation completed Falls evaluation completed Falls prevention discussed    FALL RISK PREVENTION PERTAINING TO THE HOME:  Any stairs in or around the home? No  If so, are there any without handrails? No  Home free of loose throw rugs in walkways, pet beds, electrical cords, etc? Yes  Adequate lighting in your home to reduce risk of falls? Yes   ASSISTIVE DEVICES UTILIZED TO PREVENT FALLS:  Life alert? No  Use of a cane, walker or w/c? No  Grab bars in the bathroom? No  Shower chair or bench in shower? No  Elevated toilet seat or a handicapped toilet? No   TIMED UP AND GO:  Was the test performed? No . Telephonic visit.   Cognitive Function: Normal cognitive status assessed by direct observation by this Nurse  Health Advisor. No abnormalities found.       6CIT Screen 02/14/2020 06/16/2019 05/05/2018  What Year? 0 points 4 points 0 points  What month? 0  points 3 points 0 points  What time? 0 points 3 points 0 points  Count back from 20 0 points 0 points 0 points  Months in reverse 0 points 0 points 0 points  Repeat phrase 0 points 0 points 2 points  Total Score 0 10 2    Immunizations Immunization History  Administered Date(s) Administered   Fluad Quad(high Dose 65+) 10/20/2019   Influenza,inj,Quad PF,6+ Mos 11/26/2018   Moderna Sars-Covid-2 Vaccination 06/30/2019, 07/28/2019   Pneumococcal Conjugate-13 09/16/2016   Pneumococcal Polysaccharide-23 02/24/2019   Tdap 09/16/2016    TDAP status: Up to date  Flu Vaccine status: Due, Education has been provided regarding the importance of this vaccine. Advised may receive this vaccine at local pharmacy or Health Dept. Aware to provide a copy of the vaccination record if obtained from local pharmacy or Health Dept. Verbalized acceptance and understanding.  Pneumococcal vaccine status: Up to date  Covid-19 vaccine status: Completed vaccines  Qualifies for Shingles Vaccine? Yes   Zostavax completed No   Shingrix Completed?: No.    Education has been provided regarding the importance of this vaccine. Patient has been advised to call insurance company to determine out of pocket expense if they have not yet received this vaccine. Advised may also receive vaccine at local pharmacy or Health Dept. Verbalized acceptance and understanding.  Screening Tests Health Maintenance  Topic Date Due   Zoster Vaccines- Shingrix (1 of 2) Never done   COVID-19 Vaccine (3 - Moderna risk series) 08/25/2019   OPHTHALMOLOGY EXAM  03/02/2020   INFLUENZA VACCINE  05/11/2021 (Originally 09/11/2020)   HEMOGLOBIN A1C  04/12/2021   FOOT EXAM  06/21/2021   COLONOSCOPY (Pts 45-66yrs Insurance coverage will need to be confirmed)  07/01/2021   TETANUS/TDAP  09/17/2026   Hepatitis C Screening  Completed   HPV VACCINES  Aged Out    Health Maintenance  Health Maintenance Due  Topic Date Due   Zoster Vaccines-  Shingrix (1 of 2) Never done   COVID-19 Vaccine (3 - Moderna risk series) 08/25/2019   OPHTHALMOLOGY EXAM  03/02/2020    Colorectal cancer screening: Type of screening: Colonoscopy. Completed 07/02/11. Repeat every 10 years  Lung Cancer Screening: (Low Dose CT Chest recommended if Age 67-80 years, 30 pack-year currently smoking OR have quit w/in 15years.) does not qualify.   Additional Screening:  Hepatitis C Screening: does qualify; Completed 09/16/16  Vision Screening: Recommended annual ophthalmology exams for early detection of glaucoma and other disorders of the eye. Is the patient up to date with their annual eye exam?  Yes  Who is the provider or what is the name of the office in which the patient attends annual eye exams? [facility]  Dental Screening: Recommended annual dental exams for proper oral hygiene  Community Resource Referral / Chronic Care Management: CRR required this visit?  No   CCM required this visit?  No      Plan:     I have personally reviewed and noted the following in the patient's chart:   Medical and social history Use of alcohol, tobacco or illicit drugs  Current medications and supplements including opioid prescriptions. Patient is not currently taking opioid prescriptions. Functional ability and status Nutritional status Physical activity Advanced directives List of other physicians Hospitalizations, surgeries, and ER visits in previous 12 months  Vitals Screenings to include cognitive, depression, and falls Referrals and appointments  In addition, I have reviewed and discussed with patient certain preventive protocols, quality metrics, and best practice recommendations. A written personalized care plan for preventive services as well as general preventive health recommendations were provided to patient.     Criselda Peaches, LPN   [id number]   Nurse Notes: CCM services offered due to A1c of 9.1% at last visit; pt declined.

## 2020-11-20 NOTE — Patient Instructions (Signed)
Calvin Sanders , Thank you for taking time to come for your Medicare Wellness Visit. I appreciate your ongoing commitment to your health goals. Please review the following plan we discussed and let me know if I can assist you in the future.   Screening recommendations/referrals: Colonoscopy: done 07/02/11. Repeat 06/2021 Recommended yearly ophthalmology/optometry visit for glaucoma screening and checkup Recommended yearly dental visit for hygiene and checkup  Vaccinations: Influenza vaccine: due Pneumococcal vaccine: done 02/24/19 Tdap vaccine: done 09/16/16 Shingles vaccine: Shingrix discussed. Please contact your pharmacy for coverage information.  Covid-19: done 06/30/19 & 07/28/19  Advanced directives: Advance directive discussed with you today. Even though you declined this today please call our office should you change your mind and we can give you the proper paperwork for you to fill out.   Conditions/risks identified: Recommend healthy eating and physical activity to lower A1c  Next appointment: Follow up in one year for your annual wellness visit.   Preventive Care 25 Years and Older, Male Preventive care refers to lifestyle choices and visits with your health care provider that can promote health and wellness. What does preventive care include? A yearly physical exam. This is also called an annual well check. Dental exams once or twice a year. Routine eye exams. Ask your health care provider how often you should have your eyes checked. Personal lifestyle choices, including: Daily care of your teeth and gums. Regular physical activity. Eating a healthy diet. Avoiding tobacco and drug use. Limiting alcohol use. Practicing safe sex. Taking low doses of aspirin every day. Taking vitamin and mineral supplements as recommended by your health care provider. What happens during an annual well check? The services and screenings done by your health care provider during your annual well  check will depend on your age, overall health, lifestyle risk factors, and family history of disease. Counseling  Your health care provider may ask you questions about your: Alcohol use. Tobacco use. Drug use. Emotional well-being. Home and relationship well-being. Sexual activity. Eating habits. History of falls. Memory and ability to understand (cognition). Work and work Statistician. Screening  You may have the following tests or measurements: Height, weight, and BMI. Blood pressure. Lipid and cholesterol levels. These may be checked every 5 years, or more frequently if you are over 53 years old. Skin check. Lung cancer screening. You may have this screening every year starting at age 20 if you have a 30-pack-year history of smoking and currently smoke or have quit within the past 15 years. Fecal occult blood test (FOBT) of the stool. You may have this test every year starting at age 87. Flexible sigmoidoscopy or colonoscopy. You may have a sigmoidoscopy every 5 years or a colonoscopy every 10 years starting at age 32. Prostate cancer screening. Recommendations will vary depending on your family history and other risks. Hepatitis C blood test. Hepatitis B blood test. Sexually transmitted disease (STD) testing. Diabetes screening. This is done by checking your blood sugar (glucose) after you have not eaten for a while (fasting). You may have this done every 1-3 years. Abdominal aortic aneurysm (AAA) screening. You may need this if you are a current or former smoker. Osteoporosis. You may be screened starting at age 74 if you are at high risk. Talk with your health care provider about your test results, treatment options, and if necessary, the need for more tests. Vaccines  Your health care provider may recommend certain vaccines, such as: Influenza vaccine. This is recommended every year. Tetanus, diphtheria, and acellular  pertussis (Tdap, Td) vaccine. You may need a Td booster  every 10 years. Zoster vaccine. You may need this after age 73. Pneumococcal 13-valent conjugate (PCV13) vaccine. One dose is recommended after age 42. Pneumococcal polysaccharide (PPSV23) vaccine. One dose is recommended after age 32. Talk to your health care provider about which screenings and vaccines you need and how often you need them. This information is not intended to replace advice given to you by your health care provider. Make sure you discuss any questions you have with your health care provider. Document Released: 02/24/2015 Document Revised: 10/18/2015 Document Reviewed: 11/29/2014 Elsevier Interactive Patient Education  2017 [place] Prevention in the Home Falls can cause injuries. They can happen to people of all ages. There are many things you can do to make your home safe and to help prevent falls. What can I do on the outside of my home? Regularly fix the edges of walkways and driveways and fix any cracks. Remove anything that might make you trip as you walk through a door, such as a raised step or threshold. Trim any bushes or trees on the path to your home. Use bright outdoor lighting. Clear any walking paths of anything that might make someone trip, such as rocks or tools. Regularly check to see if handrails are loose or broken. Make sure that both sides of any steps have handrails. Any raised decks and porches should have guardrails on the edges. Have any leaves, snow, or ice cleared regularly. Use sand or salt on walking paths during winter. Clean up any spills in your garage right away. This includes oil or grease spills. What can I do in the bathroom? Use night lights. Install grab bars by the toilet and in the tub and shower. Do not use towel bars as grab bars. Use non-skid mats or decals in the tub or shower. If you need to sit down in the shower, use a plastic, non-slip stool. Keep the floor dry. Clean up any water that spills on the floor as soon  as it happens. Remove soap buildup in the tub or shower regularly. Attach bath mats securely with double-sided non-slip rug tape. Do not have throw rugs and other things on the floor that can make you trip. What can I do in the bedroom? Use night lights. Make sure that you have a light by your bed that is easy to reach. Do not use any sheets or blankets that are too big for your bed. They should not hang down onto the floor. Have a firm chair that has side arms. You can use this for support while you get dressed. Do not have throw rugs and other things on the floor that can make you trip. What can I do in the kitchen? Clean up any spills right away. Avoid walking on wet floors. Keep items that you use a lot in easy-to-reach places. If you need to reach something above you, use a strong step stool that has a grab bar. Keep electrical cords out of the way. Do not use floor polish or wax that makes floors slippery. If you must use wax, use non-skid floor wax. Do not have throw rugs and other things on the floor that can make you trip. What can I do with my stairs? Do not leave any items on the stairs. Make sure that there are handrails on both sides of the stairs and use them. Fix handrails that are broken or loose. Make sure that handrails  are as long as the stairways. Check any carpeting to make sure that it is firmly attached to the stairs. Fix any carpet that is loose or worn. Avoid having throw rugs at the top or bottom of the stairs. If you do have throw rugs, attach them to the floor with carpet tape. Make sure that you have a light switch at the top of the stairs and the bottom of the stairs. If you do not have them, ask someone to add them for you. What else can I do to help prevent falls? Wear shoes that: Do not have high heels. Have rubber bottoms. Are comfortable and fit you well. Are closed at the toe. Do not wear sandals. If you use a stepladder: Make sure that it is fully  opened. Do not climb a closed stepladder. Make sure that both sides of the stepladder are locked into place. Ask someone to hold it for you, if possible. Clearly mark and make sure that you can see: Any grab bars or handrails. First and last steps. Where the edge of each step is. Use tools that help you move around (mobility aids) if they are needed. These include: Canes. Walkers. Scooters. Crutches. Turn on the lights when you go into a dark area. Replace any light bulbs as soon as they burn out. Set up your furniture so you have a clear path. Avoid moving your furniture around. If any of your floors are uneven, fix them. If there are any pets around you, be aware of where they are. Review your medicines with your doctor. Some medicines can make you feel dizzy. This can increase your chance of falling. Ask your doctor what other things that you can do to help prevent falls. This information is not intended to replace advice given to you by your health care provider. Make sure you discuss any questions you have with your health care provider. Document Released: 11/24/2008 Document Revised: 07/06/2015 Document Reviewed: 03/04/2014 Elsevier Interactive Patient Education  2017 [employer].

## 2020-11-26 ENCOUNTER — Other Ambulatory Visit: Payer: Self-pay | Admitting: Internal Medicine

## 2020-11-26 DIAGNOSIS — E118 Type 2 diabetes mellitus with unspecified complications: Secondary | ICD-10-CM

## 2020-11-26 NOTE — Telephone Encounter (Signed)
Requested Prescriptions  Pending Prescriptions Disp Refills  . glimepiride (AMARYL) 2 MG tablet [Pharmacy Med Name: GLIMEPIRIDE 2MG  TABLETS] 90 tablet 0    Sig: TAKE 1 TABLET(2 MG) BY MOUTH DAILY WITH BREAKFAST     Endocrinology:  Diabetes - Sulfonylureas Failed - 11/26/2020 11:47 AM      Failed - HBA1C is between 0 and 7.9 and within 180 days    Hemoglobin A1C  Date Value Ref Range Status  10/13/2020 9.1 (A) 4.0 - 5.6 % Final   Hgb A1c MFr Bld  Date Value Ref Range Status  06/21/2020 8.8 (H) 4.8 - 5.6 % Final    Comment:             Prediabetes: 5.7 - 6.4          Diabetes: >6.4          Glycemic control for adults with diabetes: <7.0          Passed - Valid encounter within last 6 months    Recent Outpatient Visits          1 month ago DM (diabetes mellitus), type 2 with complications [facility])   [facility] Glean Hess, MD   5 months ago Annual physical exam   [facility] Glean Hess, MD   9 months ago Essential hypertension   [facility] Glean Hess, MD   1 year ago DM (diabetes mellitus), type 2 with complications [facility])   [facility] Glean Hess, MD   1 year ago Annual physical exam   [facility] Glean Hess, MD      Future Appointments            In 2 months Army Melia Jesse Sans, MD [facility] [place]   In 7 months Army Melia, Jesse Sans, MD [facility], [facility]

## 2020-11-28 ENCOUNTER — Other Ambulatory Visit: Payer: Self-pay | Admitting: Internal Medicine

## 2020-11-28 DIAGNOSIS — E118 Type 2 diabetes mellitus with unspecified complications: Secondary | ICD-10-CM

## 2020-11-28 DIAGNOSIS — F331 Major depressive disorder, recurrent, moderate: Secondary | ICD-10-CM

## 2020-11-28 DIAGNOSIS — I10 Essential (primary) hypertension: Secondary | ICD-10-CM

## 2020-11-28 NOTE — Telephone Encounter (Signed)
Requested Prescriptions  Pending Prescriptions Disp Refills  . losartan-hydrochlorothiazide (HYZAAR) 100-25 MG tablet [Pharmacy Med Name: LOSARTAN-HCTZ 100-25 MG TAB] 90 tablet 1    Sig: TAKE 1 TABLET BY MOUTH EVERY DAY     Cardiovascular: ARB + Diuretic Combos Passed - 11/28/2020 12:34 PM      Passed - K in normal range and within 180 days    Potassium  Date Value Ref Range Status  06/21/2020 4.2 3.5 - 5.2 mmol/L Final         Passed - Na in normal range and within 180 days    Sodium  Date Value Ref Range Status  06/21/2020 140 134 - 144 mmol/L Final         Passed - Cr in normal range and within 180 days    Creatinine, Ser  Date Value Ref Range Status  06/21/2020 1.05 0.76 - 1.27 mg/dL Final         Passed - Ca in normal range and within 180 days    Calcium  Date Value Ref Range Status  06/21/2020 10.2 8.6 - 10.2 mg/dL Final         Passed - Patient is not pregnant      Passed - Last BP in normal range    BP Readings from Last 1 Encounters:  11/01/20 136/76         Passed - Valid encounter within last 6 months    Recent Outpatient Visits          1 month ago DM (diabetes mellitus), type 2 with complications [facility])   [facility] Glean Hess, MD   5 months ago Annual physical exam   [facility] Glean Hess, MD   9 months ago Essential hypertension   [facility] Glean Hess, MD   1 year ago DM (diabetes mellitus), type 2 with complications [facility])   [facility] Glean Hess, MD   1 year ago Annual physical exam   [facility] Glean Hess, MD      Future Appointments            In 2 months Army Melia Jesse Sans, MD [facility], [place]   In 7 months Army Melia Jesse Sans, MD [facility], [place]           . citalopram ([place]) 20 MG tablet [Pharmacy Med Name: CITALOPRAM HBR 20 MG TABLET] 90 tablet 1    Sig: TAKE 1 [place]     Psychiatry:  Antidepressants -  SSRI Passed - 11/28/2020 12:34 PM      Passed - Completed PHQ-2 or PHQ-9 in the last 360 days      Passed - Valid encounter within last 6 months    Recent Outpatient Visits          1 month ago DM (diabetes mellitus), type 2 with complications [facility])   [place], Laura H, MD   5 months ago Annual physical exam   [facility] Glean Hess, MD   9 months ago Essential hypertension   [facility] Glean Hess, MD   1 year ago DM (diabetes mellitus), type 2 with complications [facility])   [facility] Glean Hess, MD   1 year ago Annual physical exam   [facility] Glean Hess, MD      Future Appointments  In 2 months Army Melia Jesse Sans, MD [facility], [place]   In 7 months Army Melia Jesse Sans, MD [facility], [place]           . FARXIGA 10 MG TABS tablet [Pharmacy Med Name: FARXIGA 10 MG TABLET] 90 tablet 1    Sig: TAKE 1 TABLET BY [place] DAY     Endocrinology:  Diabetes - SGLT2 Inhibitors Failed - 11/28/2020 12:34 PM      Failed - LDL in normal range and within 360 days    LDL Chol Calc (NIH)  Date Value Ref Range Status  06/21/2020 108 (H) 0 - 99 mg/dL Final         Failed - HBA1C is between 0 and 7.9 and within 180 days    Hemoglobin A1C  Date Value Ref Range Status  10/13/2020 9.1 (A) 4.0 - 5.6 % Final   Hgb A1c MFr Bld  Date Value Ref Range Status  06/21/2020 8.8 (H) 4.8 - 5.6 % Final    Comment:             Prediabetes: 5.7 - 6.4          Diabetes: >6.4          Glycemic control for adults with diabetes: <7.0          Passed - Cr in normal range and within 360 days    Creatinine, Ser  Date Value Ref Range Status  06/21/2020 1.05 0.76 - 1.27 mg/dL Final         Passed - eGFR in normal range and within 360 days    GFR calc Af Amer  Date Value Ref Range Status  06/16/2019 72 >59 mL/min/1.73 Final    Comment:    **[facility] currently reports eGFR in  compliance with the current**   recommendations of the [employer]. [facility] will   update reporting as new guidelines are published from the NKF-ASN   Task force.    GFR calc non Af Amer  Date Value Ref Range Status  06/16/2019 62 >59 mL/min/1.73 Final   eGFR  Date Value Ref Range Status  06/21/2020 78 >59 mL/min/1.73 Final         Passed - Valid encounter within last 6 months    Recent Outpatient Visits          1 month ago DM (diabetes mellitus), type 2 with complications [facility])   [facility] Glean Hess, MD   5 months ago Annual physical exam   [facility] Glean Hess, MD   9 months ago Essential hypertension   [facility] Glean Hess, MD   1 year ago DM (diabetes mellitus), type 2 with complications [facility])   [facility] Glean Hess, MD   1 year ago Annual physical exam   [facility] Glean Hess, MD      Future Appointments            In 2 months Army Melia Jesse Sans, MD [facility], [place]   In 7 months Army Melia Jesse Sans, MD [facility], [facility]

## 2020-12-01 ENCOUNTER — Encounter
Admission: RE | Admit: 2020-12-01 | Discharge: 2020-12-01 | Disposition: A | Discharge status: Home / Self Care | Payer: BC Managed Care – PPO | Source: Ambulatory Visit | Attending: Urology | Admitting: Urology

## 2020-12-01 ENCOUNTER — Other Ambulatory Visit: Payer: Self-pay

## 2020-12-01 ENCOUNTER — Ambulatory Visit
Admission: RE | Admit: 2020-12-01 | Discharge: 2020-12-01 | Disposition: A | Discharge status: Home / Self Care | Payer: BC Managed Care – PPO | Source: Ambulatory Visit | Attending: Urology | Admitting: Urology

## 2020-12-01 DIAGNOSIS — C61 Malignant neoplasm of prostate: Secondary | ICD-10-CM | POA: Insufficient documentation

## 2020-12-01 DIAGNOSIS — I700 Atherosclerosis of aorta: Secondary | ICD-10-CM | POA: Diagnosis not present

## 2020-12-01 LAB — POCT I-STAT CREATININE: Creatinine, Ser: 1.1 mg/dL (ref 0.61–1.24)

## 2020-12-01 IMAGING — CT CT ABD-PELV W/ CM
2 of 5 series · 16 of 46 positions shown, 18 images · IV contrast (APPLIED)
Comparison: No cross-sectional imaging for comparison.

CLINICAL DATA: A 64-year-old male presents for staging of prostate
neoplasm.

EXAM:
CT ABDOMEN AND PELVIS WITH CONTRAST
TECHNIQUE: Multidetector CT imaging of the abdomen and pelvis was performed
using the standard protocol following bolus administration of
intravenous contrast.
CONTRAST:  100mL OMNIPAQUE IOHEXOL 300 MG/ML  SOLN

[Series 2: routine abd/pel with · axial · 0.86mm/px · z∈[-930,-520]mm · 13 of 94 slices shown, 15 images]
[im 6/94  soft-tissue]
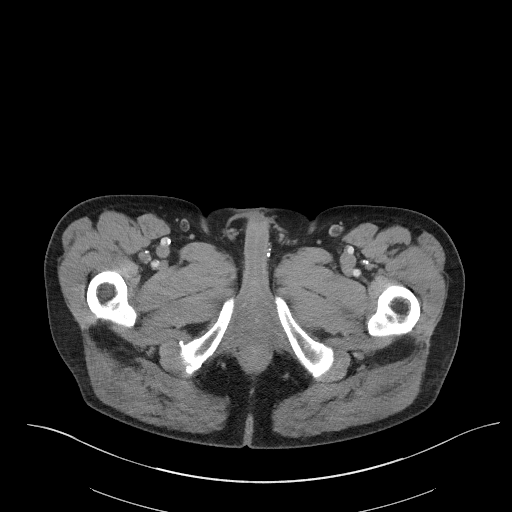
[im 6/94  bone]
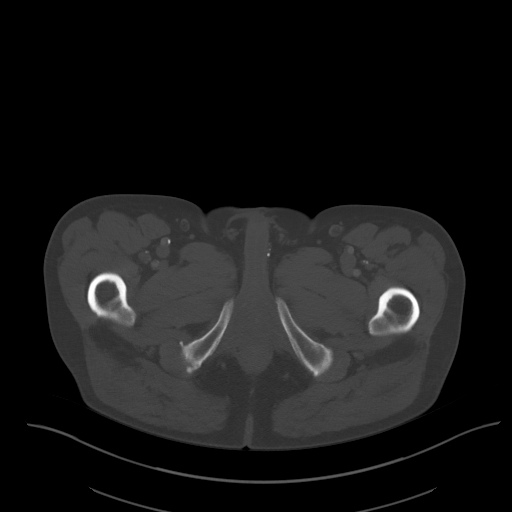
[im 11/94  soft-tissue]
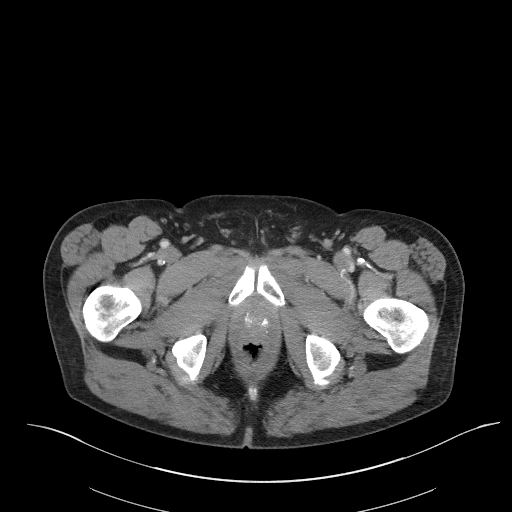
[im 21/94  soft-tissue]
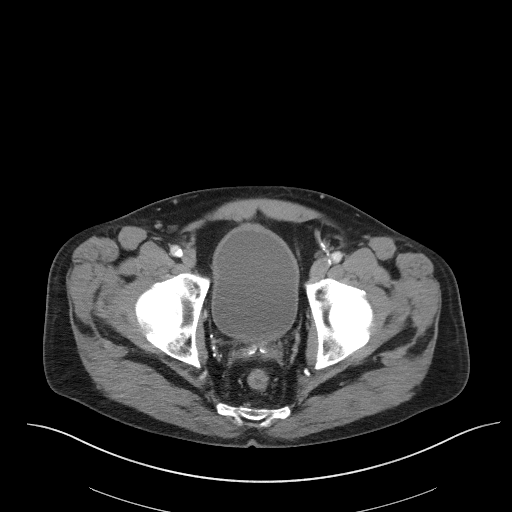
[im 26/94  soft-tissue]
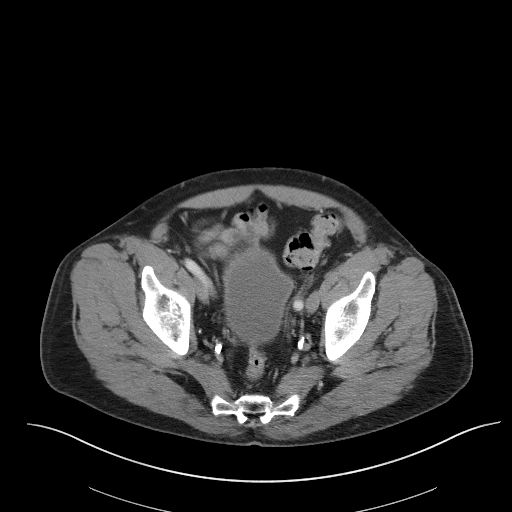
[im 32/94  soft-tissue]
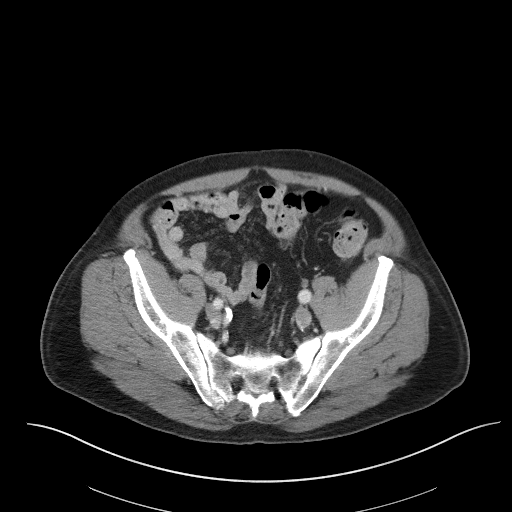
[im 42/94  soft-tissue]
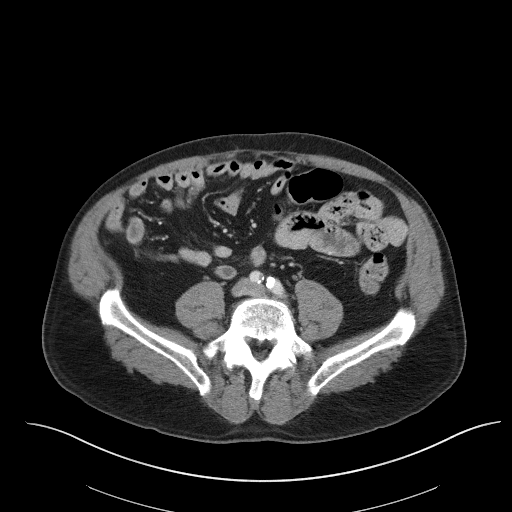
[im 47/94  soft-tissue]
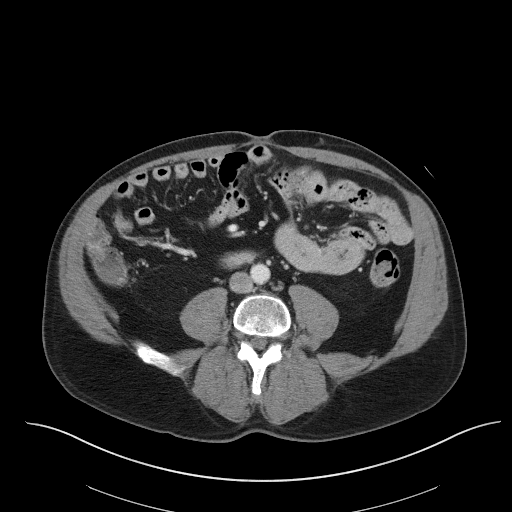
[im 52/94  soft-tissue]
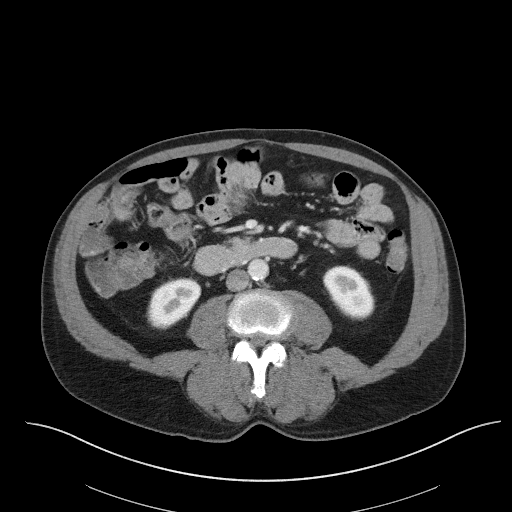
[im 63/94  soft-tissue]
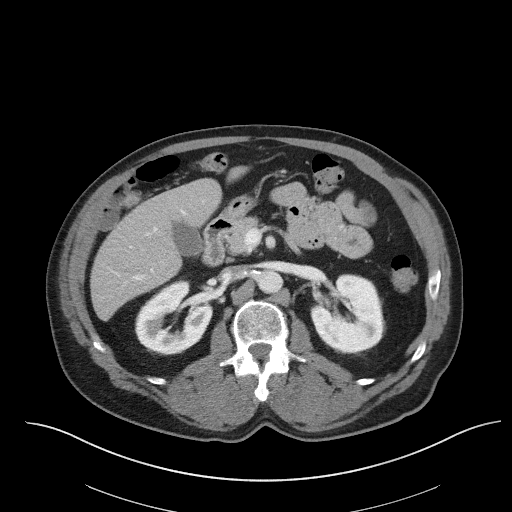
[im 63/94  bone]
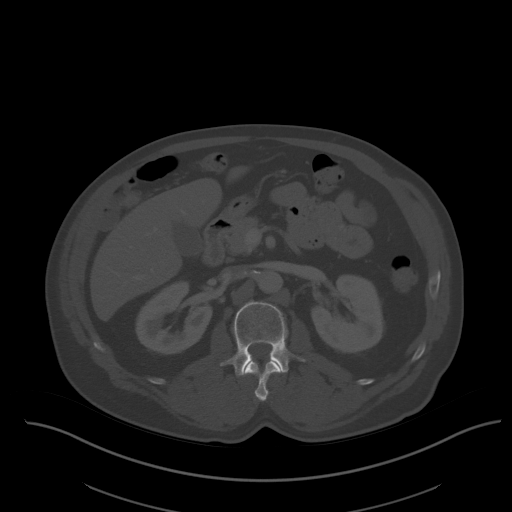
[im 68/94  soft-tissue]
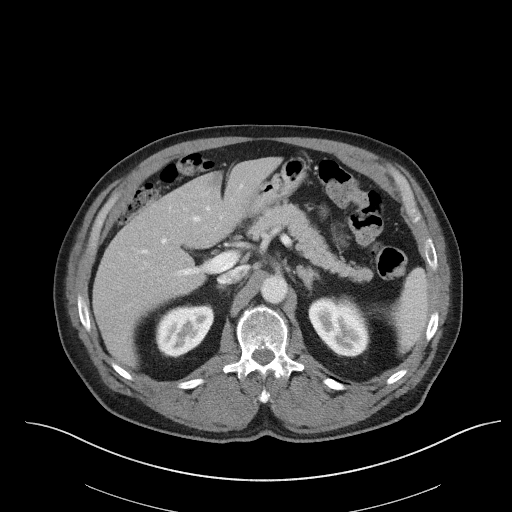
[im 73/94  soft-tissue]
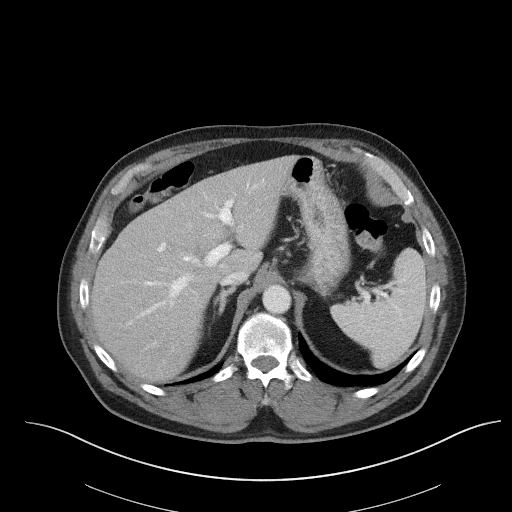
[im 83/94  soft-tissue]
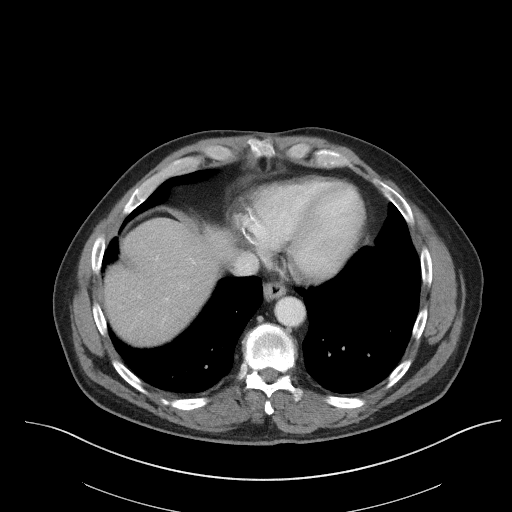
[im 88/94  soft-tissue]
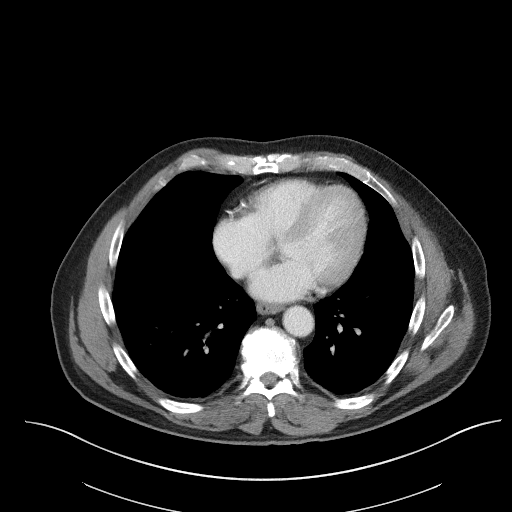

[Series 5: coronal st · coronal · 0.78mm/px · 3 of 92 slices shown]
[im 31/92  soft-tissue]
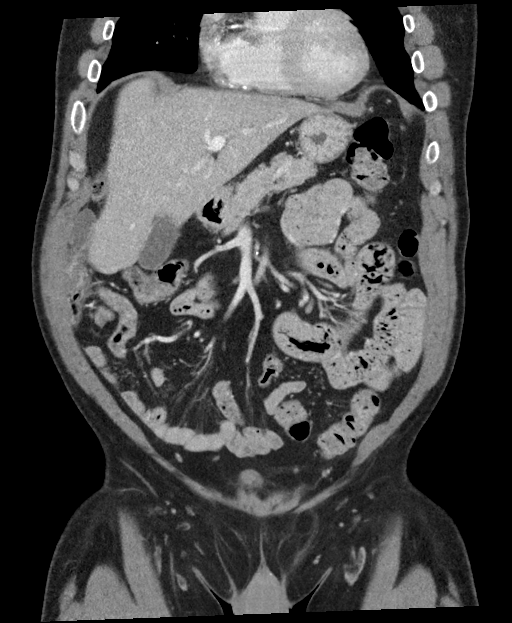
[im 41/92  soft-tissue]
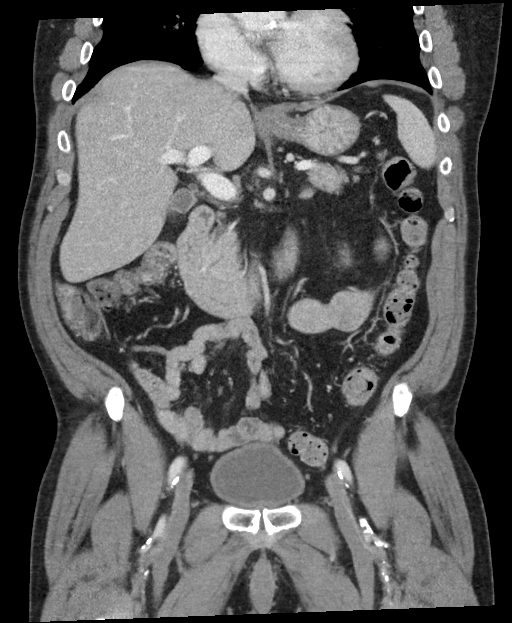
[im 51/92  soft-tissue]
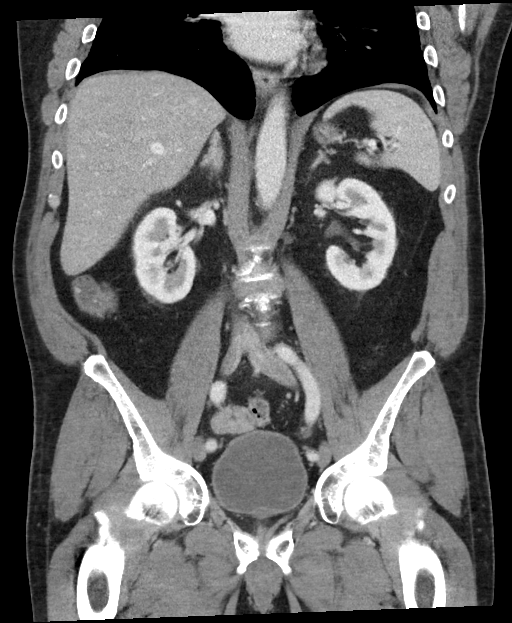

[16 of 46 positions shown; findings below may reference images not displayed]

FINDINGS: Lower chest: Basilar atelectasis. No effusion. No consolidative
changes.

Hepatobiliary: No focal, suspicious hepatic lesion. No
pericholecystic stranding. No biliary duct dilation. Portal vein is
patent.

Pancreas: Normal, without mass, inflammation or ductal dilatation.

Spleen: Normal spleen.

Adrenals/Urinary Tract:

Adrenal glands are unremarkable. Symmetric renal enhancement. No
sign of hydronephrosis. No suspicious renal lesion or perinephric
stranding.

Urinary bladder is grossly unremarkable.

Stomach/Bowel: No acute gastrointestinal process. Appendix is
normal.

Vascular/Lymphatic:

Aortic atherosclerosis. No sign of aneurysm. Smooth contour of the
IVC. There is no gastrohepatic or hepatoduodenal ligament
lymphadenopathy. No retroperitoneal or mesenteric lymphadenopathy.

No pelvic sidewall lymphadenopathy.

Reproductive: Unremarkable

Other: No ascites.

Musculoskeletal: No acute bone finding. No destructive bone process.
Spinal degenerative changes.
IMPRESSION: No evidence of metastatic disease or acute findings in the abdomen
or pelvis.

Aortic atherosclerosis.

## 2020-12-01 MED ORDER — TECHNETIUM TC 99M MEDRONATE IV KIT
20.0000 | PACK | Freq: Once | INTRAVENOUS | Status: AC | PRN
  Administered 2020-12-01: 20.9 via INTRAVENOUS

## 2020-12-01 MED ORDER — IOHEXOL 300 MG/ML  SOLN
100.0000 mL | Freq: Once | INTRAMUSCULAR | Status: AC | PRN
  Administered 2020-12-01: 100 mL via INTRAVENOUS

## 2020-12-04 ENCOUNTER — Encounter: Payer: Self-pay | Admitting: Urology

## 2020-12-11 ENCOUNTER — Encounter: Payer: Self-pay | Admitting: Urology

## 2020-12-11 ENCOUNTER — Ambulatory Visit (INDEPENDENT_AMBULATORY_CARE_PROVIDER_SITE_OTHER): Payer: BC Managed Care – PPO | Admitting: Urology

## 2020-12-11 ENCOUNTER — Other Ambulatory Visit: Payer: Self-pay

## 2020-12-11 VITALS — BP 116/67 | HR 62 | Ht 74.0 in | Wt 170.0 lb

## 2020-12-11 DIAGNOSIS — C61 Malignant neoplasm of prostate: Secondary | ICD-10-CM | POA: Diagnosis not present
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
## 2020-12-12 NOTE — Progress Notes (Signed)
12/13/20 10:13 AM   [name] [birth year] [id number]  Referring provider:  Glean Hess, MD [address] [place],  [place] [address] Chief Complaint  Patient presents with   Follow-up     HPI: Calvin Sanders is a 65 y.o.male with a personal history of prostate cancer, who presents today for discussion of surgery for prostate cancer.   He has history of abnormal rectal exam with abnormal PSA velocity. His prostate volume is 47g.   Surgical pathology on 10/24/2020 revealed Gleason 4+3 involving 2 cores, and Gleason 3+3 involving 2 cores.   CT abdomen and pelvis on 12/04/2020 revealed no evidence of metastatic disease or acute findings in the abdomen.   He is diabetic and has had recent HGBA1C of 9.1.   His BMI is 21.83 as of  10/2020. He has no previous abdominal surgeries.  He is doing well today and is accompanied by his wife.  Baseline IPSS / SHIM below.  Baseline severe erectile dysfunction on Viagra.    IPSS     Row Name 12/13/20 0900         International Prostate Symptom Score   How often have you had the sensation of not emptying your bladder? Not at All     How often have you had to urinate less than every two hours? Less than 1 in 5 times     How often have you found you stopped and started again several times when you urinated? Less than 1 in 5 times     How often have you found it difficult to postpone urination? Not at All     How often have you had a weak urinary stream? Not at All     How often have you had to strain to start urination? Not at All     How many times did you typically get up at night to urinate? 1 Time     Total IPSS Score 3       Quality of Life due to urinary symptoms   If you were to spend the rest of your life with your urinary condition just the way it is now how would you feel about that? Pleased              Score:  1-7 Mild 8-19 Moderate 20-35 Severe   SHIM     Row Name 12/13/20 0954          SHIM: Over the last 6 months:   How do you rate your confidence that you could get and keep an erection? Moderate     When you had erections with sexual stimulation, how often were your erections hard enough for penetration (entering your partner)? Most Times (much more than half the time)     During sexual intercourse, how often were you able to maintain your erection after you had penetrated (entered) your partner? Most Times (much more than half the time)     During sexual intercourse, how difficult was it to maintain your erection to completion of intercourse? Slightly Difficult     When you attempted sexual intercourse, how often was it satisfactory for you? Most Times (much more than half the time)       SHIM Total Score   SHIM 19                PMH: Past Medical History:  Diagnosis Date   Alcohol dependence, daily use ([place]) 08/05/2014   Previously 6-8 beers per day,  cut back in 2020 to 2 per day; in 09/2018 he is only drinking 2 beers on Sat and Sun   Diabetes mellitus without complication ([place])    Hypertension     Surgical History: Past Surgical History:  Procedure Laterality Date   NO PAST SURGERIES     PROSTATE BIOPSY N/A 10/24/2020   Procedure: PROSTATE BIOPSY;  Surgeon: Abbie Sons, MD;  Location: [facility] ORS;  Service: Urology;  Laterality: N/A;   TRANSRECTAL ULTRASOUND N/A 10/24/2020   Procedure: TRANSRECTAL ULTRASOUND;  Surgeon: Abbie Sons, MD;  Location: [facility] ORS;  Service: Urology;  Laterality: N/A;  Confirmed with Kim in Ultrasound    Home Medications:  Allergies as of 12/13/2020   No Known Allergies      Medication List        Accurate as of December 13, 2020 10:13 AM. If you have any questions, ask your nurse or doctor.          acetaminophen 500 MG tablet Commonly known as: TYLENOL Take 2 tablets (1,000 mg total) by mouth every 8 (eight) hours as needed.   atorvastatin 40 MG tablet Commonly known as: LIPITOR TAKE 1 TABLET BY MOUTH  DAILY   B-12 500 MCG Tabs Take 1 tablet by mouth daily.   citalopram 20 MG tablet Commonly known as: CELEXA TAKE 1 TABLET BY MOUTH EVERY DAY   Farxiga 10 MG Tabs tablet Generic drug: dapagliflozin propanediol TAKE 1 TABLET BY MOUTH EVERY DAY   glimepiride 2 MG tablet Commonly known as: AMARYL TAKE 1 TABLET(2 MG) BY MOUTH DAILY WITH BREAKFAST   losartan-hydrochlorothiazide 100-25 MG tablet Commonly known as: HYZAAR TAKE 1 TABLET BY MOUTH EVERY DAY   metFORMIN 500 MG tablet Commonly known as: GLUCOPHAGE TAKE 2 TABLETS BY MOUTH EVERY MORNING AND 1 TABLET IN THE EVENING   sildenafil 100 MG tablet Commonly known as: VIAGRA Take 1 tablet (100 mg total) by mouth daily as needed for erectile dysfunction. Take two hours prior to intercourse on an empty stomach   Vitamin D3 50 MCG (2000 UT) Tabs Take 2,000 Units by mouth daily.        Allergies: No Known Allergies  Family History: Family History  Problem Relation Age of Onset   Diabetes Mother    Cancer Father        bone   Heart disease Brother    Prostate cancer Brother    Kidney cancer Neg Hx    Kidney disease Neg Hx    Bladder Cancer Neg Hx     Social History:  reports that he quit smoking about 3 months ago. His smoking use included cigarettes. He started smoking about 47 years ago. He has a 11.25 pack-year smoking history. He has never used smokeless tobacco. He reports current alcohol use of about 14.0 standard drinks per week. He reports that he does not use drugs.   Physical Exam: BP 138/74   Pulse (!) 59   Ht 6\' 2"  (1.88 m)   Wt 170 lb (77.1 kg)   BMI 21.83 kg/m   Constitutional:  Alert and oriented, No acute distress. HEENT: [place] AT, moist mucus membranes.  Trachea midline, no masses. Cardiovascular: No clubbing, cyanosis, or edema. Respiratory: Normal respiratory effort, no increased work of breathing. Skin: No rashes, bruises or suspicious lesions. Neurologic: Grossly intact, no focal deficits,  moving all 4 extremities. Psychiatric: Normal mood and affect.  Laboratory Data:  Lab Results  Component Value Date   CREATININE 1.10 12/01/2020   Lab  Results  Component Value Date   HGBA1C 9.1 (A) 10/13/2020    Assessment & Plan:    Prostate cancer  Unfavorable intermediate risk prostate cancer, metastatic work-up is negative  The patient was counseled about the natural history of prostate cancer and the standard treatment options that are available for prostate cancer. It was explained to him how his age and life expectancy, clinical stage, Gleason score, and PSA affect his prognosis, the decision to proceed with additional staging studies, as well as how that information influences recommended treatment strategies. We discussed the roles for active surveillance, radiation therapy, surgical therapy, androgen deprivation, as well as ablative therapy options for the treatment of prostate cancer as appropriate to his individual cancer situation. We discussed the risks and benefits of these options with regard to their impact on cancer control and also in terms of potential adverse events, complications, and impact on quality of life particularly related to urinary, bowel, and sexual function. The patient was encouraged to ask questions throughout the discussion today and all questions were answered to his stated satisfaction. In addition, the patient was provided with and/or directed to appropriate resources and literature for further education about prostate cancer treatment options.  We discussed surgical therapy for prostate cancer including the different available surgical approaches.  Specifically, we discussed robotic prostatectomy with pelvic lymph node dissection based on his restratification.  We discussed, in detail, the risks and expectations of surgery with regard to cancer control, urinary control, and erectile dysfunction as well as expected post operative recovery processed.  Additional risks of surgery including but not limited to bleeding, infection, hernia formation, nerve damage, fistula formation, bowel/rectal injury, potentially necessitating colostomy, damage to the urinary tract resulting in urinary leakage, urethral stricture, and cardiopulmonary risk such as myocardial infarction, stroke, death, thromboembolism etc. were explained.   - Discussed pelvic floor exercises to build pelvic floor muscle and the importance of maintaining pelvic floor muscles before and after surgery. Referral sent to physical therapy.    -He understands in the setting of severe erectile dysfunction, he will likely have worse postop erectile function and may require further intervention on the road such as injections or implant.  We will still try for nerve sparing procedure intraoperatively.  2. Diabetes  - Discussed concern for HGBA1C being high. Stressed why it is important to have glycemic levels controlled before surgery which is behavioral. Note sent to PCP, Dr. Army Melia, to see if there is any optimization of medications to reduce risk of post-op complications.  We also discussed behavior modification and dietary intervention.  Follow-up in 6-8 weeks for robotic prostatectomy    I,Kailey Littlejohn,acting as a scribe for Hollice Espy, MD.,have documented all relevant documentation on the behalf of Hollice Espy, MD,as directed by  Hollice Espy, MD while in the presence of Hollice Espy, MD.  I have reviewed the above documentation for accuracy and completeness, and I agree with the above.   Hollice Espy, MD  I spent 47 total minutes on the day of the encounter including pre-visit review of the medical record, face-to-face time with the patient, and post visit ordering of labs/imaging/tests.   [place] [address] [place], [place] [address] [phone]

## 2020-12-13 ENCOUNTER — Encounter: Payer: Self-pay | Admitting: Urology

## 2020-12-13 ENCOUNTER — Other Ambulatory Visit: Payer: Self-pay

## 2020-12-13 ENCOUNTER — Other Ambulatory Visit: Payer: Self-pay | Admitting: Urology

## 2020-12-13 ENCOUNTER — Ambulatory Visit (INDEPENDENT_AMBULATORY_CARE_PROVIDER_SITE_OTHER): Payer: BC Managed Care – PPO | Admitting: Urology

## 2020-12-13 VITALS — BP 138/74 | HR 59 | Ht 74.0 in | Wt 170.0 lb

## 2020-12-13 DIAGNOSIS — C61 Malignant neoplasm of prostate: Secondary | ICD-10-CM

## 2020-12-13 NOTE — Progress Notes (Signed)
Surgical Physician Order Form  * Scheduling expectation : Next Available  *Length of Case:   *Clearance needed: yes, PCP to optimize glycemic control.  I have already sent her my office note today.  This is not truly a clearance however I would like him to be seen and for Dr. Army Melia to weigh in whether or not any of his medications could or should be adjusted.   *Anticoagulation Instructions: Hold all anticoagulants  *Aspirin Instructions: Hold Aspirin  *Post-op visit Date/Instructions:  1 week cath removal  *Diagnosis:  Prostate cancer  *Procedure: Robotic laparoscopic Prostatectomy (09811) with pelvic lymph node dissection  -Admit type: Observation  -Anesthesia: General  -VTE Prophylaxis Standing Order SCD's       Other:   -Standing Lab Orders Per Anesthesia    Lab other:  CBC, bmp,  INR, UA, urine culture, type and screen  -Standing Test orders EKG/Chest x-ray per Anesthesia       Test other:   - Medications:     Ancef 2gm IV   Other Instructions:  Also needs PT preop (requests [facility])

## 2020-12-14 ENCOUNTER — Telehealth: Payer: Self-pay | Admitting: Urology

## 2020-12-14 NOTE — Telephone Encounter (Signed)
Per Dr. Erlene Quan Patient is to be scheduled for Robotic laparoscopic Prostatectomy with pelvic lymph node dissection  Calvin Sanders was contacted and possible surgical dates were discussed, 01/15/21 was agreed upon for surgery. Patient was instructed that Dr. Erlene Quan  will require them to provide a pre-op UA & CX prior to surgery.  Patient was directed to call [phone] between 1-3pm the day before surgery to find out surgical arrival time.  Instructions were given not to eat or drink from midnight on the night before surgery and have a driver for the day of surgery. On the surgery day patient was instructed to enter through the [place] entrance of [facility] report the Same Day Surgery desk.   Pre-Admit Testing will be in contact via phone to set up an interview with the anesthesia team to review your history and medications prior to surgery.   Reminder of this information was sent via mychart to the patient.   Patient is to hold anticoag's and ASA per Dr. Erlene Quan.

## 2020-12-15 ENCOUNTER — Telehealth: Payer: Self-pay

## 2020-12-15 NOTE — Progress Notes (Signed)
REQUEST FOR SURGICAL CLEARANCE       Date: Date: 12/15/20  Faxed to: Dr. Army Melia  Surgeon: Dr. Hollice Espy, MD     Date of Surgery: 01/15/2021  Operation: Robotic Laparoscopic Radical Prostatectomy with pelvic lymph node dissection   Anesthesia Type: General   Diagnosis: Prostate Cancer   Patient Requires: This is not truly a clearance however I would like him to be seen and for Dr. Army Melia to weigh in whether or not any of his medications could or should be adjusted.  Medical Clearance : Yes  Reason: Glycemic Control  Risk Assessment:    Low   []       Moderate   []     High   []           This patient is optimized for surgery  YES []       NO   []    I recommend further assessment/workup prior to surgery. YES []      NO  []   Appointment scheduled for: _______________________   Further recommendations: ____________________________________     Physician Signature:__________________________________   Printed Name: ________________________________________   Date: _________________

## 2020-12-15 NOTE — Progress Notes (Signed)
[facility] Posting Form   Surgery Date/Time: Date: 01/15/2021  Surgeon: Dr. Hollice Espy, MD  Surgery Location: Day Surgery  Inpt ( No  )   Outpt (No)   Obs ( Yes  )   Diagnosis: C61 Prostate Cancer   -CPT: 17356  Surgery: Robotic Laparoscopic Radical Prostatectomy  -CPT: 70141  Surgery: Pelvic Lymph Node Dissection   Stop Anticoagulations: Yes  Cardiac/Medical/Pulmonary Clearance needed: Yes PCP Clearance  Clearance needed from Dr: Army Melia  Clearance request sent on: Date: 12/15/20   *Orders entered into EPIC  Date: 12/15/20   *Case booked in EPIC  Date: 12/14/2020  *Notified pt of Surgery: Date: 12/14/2020  PRE-OP UA & CX: Yes will obtain with Pre-Admit Labs- CBC, BMP, INR, Type and Screen and UA and Culture  *Placed into Prior Authorization Work [place] Date: 12/15/20   Assistant/laser/rep:No

## 2020-12-15 NOTE — Telephone Encounter (Signed)
Error

## 2020-12-18 ENCOUNTER — Other Ambulatory Visit: Payer: Self-pay

## 2020-12-18 ENCOUNTER — Ambulatory Visit: Payer: BC Managed Care – PPO | Attending: Urology | Admitting: Physical Therapy

## 2020-12-18 ENCOUNTER — Encounter: Payer: Self-pay | Admitting: Physical Therapy

## 2020-12-18 DIAGNOSIS — R293 Abnormal posture: Secondary | ICD-10-CM | POA: Diagnosis present

## 2020-12-18 DIAGNOSIS — R278 Other lack of coordination: Secondary | ICD-10-CM | POA: Diagnosis present

## 2020-12-18 DIAGNOSIS — M6281 Muscle weakness (generalized): Secondary | ICD-10-CM | POA: Diagnosis present

## 2020-12-18 DIAGNOSIS — C61 Malignant neoplasm of prostate: Secondary | ICD-10-CM | POA: Diagnosis not present

## 2020-12-18 NOTE — Therapy (Signed)
[facility] [address] [place], [place], [address] Phone: [phone]   Fax:  [phone]  Physical Therapy Evaluation  Patient Details  Name: Calvin Sanders MRN: [id number] Date of Birth: 07-08-1955 Referring Provider (PT): Erlene Quan   Encounter Date: 12/18/2020   PT End of Session - 12/18/20 1543     Visit Number 1    Number of Visits 4    Date for PT Re-Evaluation 01/15/21    Authorization Type IE 12/18/2020    PT Start Time 1115    PT Stop Time 1200    PT Time Calculation (min) 45 min    Activity Tolerance Patient tolerated treatment well    Behavior During Therapy [facility] for tasks assessed/performed             Past Medical History:  Diagnosis Date   Alcohol dependence, daily use ([place]) 08/05/2014   Previously 6-8 beers per day, cut back in 2020 to 2 per day; in 09/2018 he is only drinking 2 beers on Sat and Sun   Diabetes mellitus without complication ([place])    Hypertension     Past Surgical History:  Procedure Laterality Date   NO PAST SURGERIES     PROSTATE BIOPSY N/A 10/24/2020   Procedure: PROSTATE BIOPSY;  Surgeon: Abbie Sons, MD;  Location: [facility] ORS;  Service: Urology;  Laterality: N/A;   TRANSRECTAL ULTRASOUND N/A 10/24/2020   Procedure: TRANSRECTAL ULTRASOUND;  Surgeon: Abbie Sons, MD;  Location: [facility] ORS;  Service: Urology;  Laterality: N/A;  Confirmed with Kim in Ultrasound    There were no vitals filed for this visit.        [facility] PT Assessment - 12/18/20 0001       Assessment   Medical Diagnosis Prostate Cancer    Referring Provider (PT) Erlene Quan    Onset Date/Surgical Date 01/15/21    Hand Dominance Right      Balance Screen   Has the patient fallen in the past 6 months No              PELVIC HEALTH PHYSICAL THERAPY EVALUATION  SCREENING  Precautions: Prostate Cancer   SUBJECTIVE  Chief Complaint: Patient denies any urinary symptoms. Noted from chart to deal with ED  at baseline. Patient endorses having to "pump" to get the last bit of urine out. Patient does not occasional post-micturition UI.  Pertinent History:  Falls Negative. Scoliosis Negative. Pulmonary disease/dysfunction Negative.  Surgical history: Negative.  Work History:   Occupation: retired from [employer]; also worked as Presenter, broadcasting  Urological History: Prostatectomy: Date: 01/15/2021 scheduled   Urinary History: Incontinence: Positive for every once in awhile. Triggers: delaying at night Amount: Min. Protective undergarments: No Number used/day: 0 Fluid Intake: maybe 16oz H20, 4 cups coffee caffeinated, 1-3 beers alcohol, 2x 16 oz coke/dr pepper Nocturia: 0-1x/night Frequency of urination: 3-4x/day (depends on fluid consumption) Toileting posture: standing Pain with urination: Negative  Difficulty initiating urination: Negative  Intermittent stream: Positive for end of stream.  Post-micturition dribble: Positive for occasional/rare.  Frequent UTI: Negative.  Gastrointestinal History: Denies any concerns.   Location of pain: n/a   Current activities: mowing lawn, tinker, relax  Patient Goals: unsure/ unable to state   OBJECTIVE  Mental Status Patient is oriented to person, place and time. Recent memory is intact. Remote memory is intact. Attention span and concentration are intact. Expressive speech is intact. Patient's fund of knowledge is within normal limits for educational level.  POSTURE/OBSERVATIONS:  Lumbar  lordosis: diminished Iliac crest height: appearing equal bilaterally  Stands and sits with increased B hip external rotation  GAIT: Grossly WNL/WFL.  RANGE OF MOTION:    LEFT RIGHT  Lumbar forward flexion (65):  WNL    Lumbar extension (30): WNL    Lumbar lateral flexion (25):  WNL WNL  Thoracic and Lumbar rotation (30 degrees):    WNL WNL    STRENGTH: MMT  No gross deficits noted on functional movements in clinic. Formal testing at  next session.   ABDOMINAL:  Palpation: no tenderness to palpation noted Diastasis: 2.5 fingers superior and inferior to the umbilicus, 3.5 fingers at umbilicus. Apparent coning/doming with lumbar extension and lumbar flexion. Scar mobility: n/a Rib flare: roughly 120 degrees  SPECIAL TESTS: None indicated  PHYSICAL PERFORMANCE MEASURES:  STS: WNL  EXTERNAL PELVIC EXAM:  Palpation: Breath coordination: present Perineal Mobility Testing: Voluntary Contraction: present initially with substantial gluteal compensations; able to coordinate 3 repetitions with moderate VCs to isolate PFM Relaxation: present Perineal movement with sustained increase in IAP ("bear down"): deferred 2/2 to time constraints and prioritizing HEP Perineal movement with rapid increase in IAP ("cough"): deferred 2/2 to time constraints and prioritizing HEP  DIGITAL RECTAL EXAM: deferred 2/2 to time constraints  Strength (PERF): Symmetry: Palpation: Prolapse:  OUTCOME MEASURES: IPSS (2)   International Prostate Symptom Score   How often have you had the sensation of not emptying your bladder? 0    How often have you had to urinate less than every two hours?  0    How often have you found you stopped and started again several times when you urinated?  2    How often have you found it difficult to postpone urination?  0    How often have you had a weak urinary stream?  0    How often have you had to strain to start urination?  0    How many times did you typically get up at night to urinate?  0    Total IPSS Score 2    ASSESSMENT Patient is a 65 year old presenting to clinic with chief complaints of occasional urinary incontinence and predictable increase in urinary symptoms s/p prostatectomy (01/15/2021). Today's evaluation is suggestive of deficits in bladder behaviors, posture, IAP management, PFM coordination, and PFM strength as evidenced by diminished lumbar lordosis, 2.5-3.5 finger width separation of  rectus abdominis, gluteal compensations with PFM contraction, 3 repetitions of PFM contractions in sidelying posture with palpable squeeze, no lift, and high consumption of bladder irritants. Patient's responses on IPSS outcome measures (2) indicate minimal/no functional limitations/disability/distress. Patient's progress may be limited due to the unpredictable nature of primary diagnosis; however, patient's attendance today is advantageous. Patient was able to achieve basic understanding of pelvic floor muscle functions during today's evaluation and responded positively to educational interventions. Patient will benefit from continued skilled therapeutic intervention to address deficits in bladder behaviors, posture, IAP management, PFM coordination, and PFM strength in order to increase function and improve overall QOL.  EDUCATION Patient educated on prognosis, POC, and provided with HEP including: [id number]. Patient articulated understanding and returned demonstration. Patient will benefit from further education in order to maximize compliance and understanding for long-term therapeutic gains.  TREATMENT  Neuromuscular Re-education: Patient educated on primary functions of the pelvic floor including: posture/balance, sexual pleasure, storage and elimination of waste from the body, abdominal cavity closure, and breath coordination.   Objective measurements completed on examination: See above findings.  PT Long Term Goals - 12/18/20 1603       PT LONG TERM GOAL #1   Title Patient will demonstrate independence with HEP in order to maximize therapeutic gains and improve carryover from physical therapy sessions to ADLs in the home and community.    Baseline IE: provided    Time 4    Period Weeks    Status New    Target Date 01/15/21      PT LONG TERM GOAL #2   Title Patient will demonstrate circumferential and sequential contraction of >4/5 MMT, > 6 sec hold x10 and 5 consecutive  quick flicks with </= 10 min rest between testing bouts, and relaxation of the PFM coordinated with breath for improved management of intra-abdominal pressure and normal bowel and bladder function without the presence of pain nor incontinence in order to improve participation at home and in the community.    Baseline IE: 2/5, 3x    Time 4    Period Weeks    Status New    Target Date 01/15/21      PT LONG TERM GOAL #3   Title Patient will demonstrate improved IAP management as indicated by < 2 finger width DRAM on testing for decreased risk of prolonged SUI s/p prostatectomy.    Baseline IE: 2.5 superior and inferior, 3.5 at umbilicus    Time 4    Period Weeks    Status New    Target Date 01/15/21      PT LONG TERM GOAL #4   Title Patient will indicate regular consumption of at least 64 ounces of water over the course of 24 hours in order to decrease bladder irritants for decreased risk of urinary frequency and incontinence s/p prostatectomy.    Baseline IE: occasional 16 oz of water    Time 4    Period Weeks    Status New    Target Date 01/15/21                    Plan - 12/18/20 1544     Clinical Impression Statement Patient is a 65 year old presenting to clinic with chief complaints of occasional urinary incontinence and predictable increase in urinary symptoms s/p prostatectomy (01/15/2021). Today's evaluation is suggestive of deficits in bladder behaviors, posture, IAP management, PFM coordination, and PFM strength as evidenced by diminished lumbar lordosis, 2.5-3.5 finger width separation of rectus abdominis, gluteal compensations with PFM contraction, 3 repetitions of PFM contractions in sidelying posture with palpable squeeze, no lift, and high consumption of bladder irritants. Patient's responses on IPSS outcome measures (2) indicate minimal/no functional limitations/disability/distress. Patient's progress may be limited due to the unpredictable nature of primary  diagnosis; however, patient's attendance today is advantageous. Patient was able to achieve basic understanding of pelvic floor muscle functions during today's evaluation and responded positively to educational interventions. Patient will benefit from continued skilled therapeutic intervention to address deficits in bladder behaviors, posture, IAP management, PFM coordination, and PFM strength in order to increase function and improve overall QOL.    Personal Factors and Comorbidities Comorbidity 3+;Age;Behavior Pattern    Comorbidities DM, HTN, prostate cancer, ED, MDD    Examination-Activity Limitations Continence;Other    Examination-Participation Restrictions Interpersonal Relationship    Stability/Clinical Decision Making Evolving/Moderate complexity    Clinical Decision Making Moderate    Rehab Potential Good    PT Frequency 1x / week    PT Duration 4 weeks    PT Treatment/Interventions Cryotherapy;Moist Heat;Therapeutic exercise;Neuromuscular re-education;Patient/family  education;Manual techniques;Taping    PT Next Visit Plan deep core exercises; bladder irritants    PT Home Exercise Plan [id number]    Consulted and Agree with Plan of Care Patient             Patient will benefit from skilled therapeutic intervention in order to improve the following deficits and impairments:  Decreased coordination, Postural dysfunction, Decreased strength, Improper body mechanics, Decreased endurance, Decreased activity tolerance  Visit Diagnosis: Muscle weakness (generalized)  Other lack of coordination  Abnormal posture     Problem List Patient Active Problem List   Diagnosis Date Noted   MCI (mild cognitive impairment) 03/19/2017   Major depressive disorder, recurrent episode, moderate ([place]) 03/19/2017   B12 deficiency 05/22/2016   Vitamin D deficiency 02/08/2015   Erectile dysfunction of organic origin 09/06/2014   Family history of prostate cancer 09/06/2014   DM (diabetes  mellitus), type 2 with complications ([place]) 21/12/7354   Hyperlipidemia associated with type 2 diabetes mellitus ([place]) 08/05/2014   Tobacco use disorder, severe, in early remission 08/05/2014   Colon polyps 05/14/2014   Chronic prostatitis 05/14/2014   Essential hypertension 05/14/2014   BPH (benign prostatic hyperplasia) 05/14/2014    Myles Gip PT, DPT [phone]  12/18/2020, 4:05 PM  [facility] [address] [place], [place], [address] Phone: [phone]   Fax:  [phone]  Name: Dhilan Brauer MRN: [id number] Date of Birth: 03-Nov-1955

## 2020-12-20 NOTE — Progress Notes (Signed)
Please call this patient and schedule for a surgery clearance with Dr. Army Melia. 20 minute appt is okay at patient convenience. Thank you.

## 2020-12-21 ENCOUNTER — Telehealth: Payer: Self-pay

## 2020-12-21 NOTE — Telephone Encounter (Signed)
-----   Message from [place], [place] sent at 12/20/2020  4:55 PM EST ----- Regarding: Appt Dr. Army Melia sent me this chart and asked for me to make him a follow up with A1C during his visit. I would assume this would be with your office. Patient is scheduled to have Surgery at [facility] on 01/15/2021 and needs PCP clearance for glycemic control. I wasn't sure who to reach out to, so I included both of you.  Thank you, Gerald Leitz

## 2020-12-21 NOTE — Telephone Encounter (Signed)
Tried to call this patient to schedule surgery clearance and A1C follow up. Left VM since unable to reach him. Told him he needs ot be seen before we can complete surgery clearance.

## 2020-12-23 ENCOUNTER — Other Ambulatory Visit: Payer: Self-pay | Admitting: Internal Medicine

## 2020-12-23 DIAGNOSIS — E118 Type 2 diabetes mellitus with unspecified complications: Secondary | ICD-10-CM

## 2020-12-23 NOTE — Telephone Encounter (Signed)
Requested Prescriptions  Pending Prescriptions Disp Refills  . glimepiride (AMARYL) 2 MG tablet [Pharmacy Med Name: GLIMEPIRIDE 2 MG TABLET] 90 tablet 0    Sig: TAKE 1 TABLET BY MOUTH EVERY DAY WITH BREAKFAST     Endocrinology:  Diabetes - Sulfonylureas Failed - 12/23/2020 12:22 PM      Failed - HBA1C is between 0 and 7.9 and within 180 days    Hemoglobin A1C  Date Value Ref Range Status  10/13/2020 9.1 (A) 4.0 - 5.6 % Final   Hgb A1c MFr Bld  Date Value Ref Range Status  06/21/2020 8.8 (H) 4.8 - 5.6 % Final    Comment:             Prediabetes: 5.7 - 6.4          Diabetes: >6.4          Glycemic control for adults with diabetes: <7.0          Passed - Valid encounter within last 6 months    Recent Outpatient Visits          2 months ago DM (diabetes mellitus), type 2 with complications [facility])   [facility] Glean Hess, MD   6 months ago Annual physical exam   [facility] Glean Hess, MD   10 months ago Essential hypertension   [facility] Glean Hess, MD   1 year ago DM (diabetes mellitus), type 2 with complications [facility])   [facility] Glean Hess, MD   1 year ago Annual physical exam   [facility] Glean Hess, MD      Future Appointments            In 2 days Glean Hess, MD [facility] [place]   In 1 month [place] Loop, [facility]   In 1 month Army Melia, Jesse Sans, MD [facility], [place]   In 6 months Army Melia, Jesse Sans, MD [facility], [facility]

## 2020-12-25 ENCOUNTER — Other Ambulatory Visit: Payer: Self-pay

## 2020-12-25 ENCOUNTER — Ambulatory Visit (INDEPENDENT_AMBULATORY_CARE_PROVIDER_SITE_OTHER): Payer: BC Managed Care – PPO | Admitting: Internal Medicine

## 2020-12-25 ENCOUNTER — Encounter: Payer: Self-pay | Admitting: Internal Medicine

## 2020-12-25 ENCOUNTER — Encounter: Payer: Self-pay | Admitting: Physical Therapy

## 2020-12-25 ENCOUNTER — Ambulatory Visit: Payer: BC Managed Care – PPO | Admitting: Physical Therapy

## 2020-12-25 VITALS — BP 124/62 | HR 64 | Ht 74.0 in | Wt 178.4 lb

## 2020-12-25 DIAGNOSIS — M6281 Muscle weakness (generalized): Secondary | ICD-10-CM | POA: Diagnosis not present

## 2020-12-25 DIAGNOSIS — R293 Abnormal posture: Secondary | ICD-10-CM

## 2020-12-25 DIAGNOSIS — C61 Malignant neoplasm of prostate: Secondary | ICD-10-CM | POA: Diagnosis not present

## 2020-12-25 DIAGNOSIS — R278 Other lack of coordination: Secondary | ICD-10-CM

## 2020-12-25 DIAGNOSIS — E118 Type 2 diabetes mellitus with unspecified complications: Secondary | ICD-10-CM | POA: Diagnosis not present

## 2020-12-25 LAB — POCT GLYCOSYLATED HEMOGLOBIN (HGB A1C): Hemoglobin A1C: 9 % — AB (ref 4.0–5.6)

## 2020-12-25 MED ORDER — METFORMIN HCL 500 MG PO TABS: 1000.0000 mg | ORAL_TABLET | Freq: Two times a day (BID) | ORAL | 5 refills | Status: DC

## 2020-12-25 NOTE — Progress Notes (Signed)
Date:  12/25/2020   Name:  Calvin Sanders   DOB:  [birth year]   MRN:  [id number]   Chief Complaint: Diabetes (Surgery Clearance. ) Surgical clearance requested for better glucose control prior to Robotic laparoscopic radical prostatectomy with lymph node dissection.  Diabetes He presents for his follow-up diabetic visit. He has type 2 diabetes mellitus. His disease course has been improving. Pertinent negatives for hypoglycemia include no headaches or tremors. Pertinent negatives for diabetes include no chest pain, no fatigue, no polydipsia and no polyuria. Current diabetic treatment includes oral agent (triple therapy) (metformin, glimepiride, farxiga). He is compliant with treatment all of the time (but not doing much with diet - wanted to add medication last time but he declined). Weight trend: down 1 lbs since last visit.  He has cut out cookies and candy and most of his nightly ice cream.  Lab Results  Component Value Date   CREATININE 1.10 12/01/2020   BUN 13 06/21/2020   NA 140 06/21/2020   K 4.2 06/21/2020   CL 98 06/21/2020   CO2 23 06/21/2020   Lab Results  Component Value Date   CHOL 175 06/21/2020   HDL 41 06/21/2020   LDLCALC 108 (H) 06/21/2020   TRIG 144 06/21/2020   CHOLHDL 4.3 06/21/2020   Lab Results  Component Value Date   TSH 1.560 06/21/2020   Lab Results  Component Value Date   HGBA1C 9.0 (A) 12/25/2020   Lab Results  Component Value Date   WBC 6.8 06/21/2020   HGB 16.0 06/21/2020   HCT 44.3 06/21/2020   MCV 94 06/21/2020   PLT 233 06/21/2020   Lab Results  Component Value Date   ALT 30 06/21/2020   AST 21 06/21/2020   ALKPHOS 94 06/21/2020   BILITOT 0.9 06/21/2020     Review of Systems  Constitutional:  Negative for appetite change, fatigue and unexpected weight change.  Eyes:  Negative for visual disturbance.  Respiratory:  Negative for cough, shortness of breath and wheezing.   Cardiovascular:  Negative for chest pain, palpitations  and leg swelling.  Gastrointestinal:  Negative for abdominal pain and blood in stool.  Endocrine: Negative for polydipsia and polyuria.  Genitourinary:  Negative for dysuria and hematuria.  Skin:  Negative for color change and rash.  Neurological:  Negative for tremors, numbness and headaches.  Psychiatric/Behavioral:  Positive for decreased concentration. Negative for dysphoric mood.    Patient Active Problem List   Diagnosis Date Noted   Prostate cancer ([place]) 12/25/2020   MCI (mild cognitive impairment) 03/19/2017   Major depressive disorder, recurrent episode, moderate ([place]) 03/19/2017   B12 deficiency 05/22/2016   Vitamin D deficiency 02/08/2015   Erectile dysfunction of organic origin 09/06/2014   Family history of prostate cancer 09/06/2014   DM (diabetes mellitus), type 2 with complications ([place]) 99/83/3825   Hyperlipidemia associated with type 2 diabetes mellitus ([place]) 08/05/2014   Tobacco use disorder, severe, in early remission 08/05/2014   Colon polyps 05/14/2014   Chronic prostatitis 05/14/2014   Essential hypertension 05/14/2014   BPH (benign prostatic hyperplasia) 05/14/2014    No Known Allergies  Past Surgical History:  Procedure Laterality Date   NO PAST SURGERIES     PROSTATE BIOPSY N/A 10/24/2020   Procedure: PROSTATE BIOPSY;  Surgeon: Abbie Sons, MD;  Location: [facility] ORS;  Service: Urology;  Laterality: N/A;   TRANSRECTAL ULTRASOUND N/A 10/24/2020   Procedure: TRANSRECTAL ULTRASOUND;  Surgeon: Abbie Sons, MD;  Location: [facility] ORS;  Service: Urology;  Laterality: N/A;  Confirmed with Kim in Ultrasound    Social History   Tobacco Use   Smoking status: Former    Packs/day: 0.25    Years: 45.00    Pack years: 11.25    Types: Cigarettes    Start date: 02/11/1973    Quit date: 09/11/2020    Years since quitting: 0.2   Smokeless tobacco: Never   Tobacco comments:    3 a day   Vaping Use   Vaping Use: Never used  Substance Use Topics   Alcohol use:  Yes    Alcohol/week: 14.0 standard drinks    Types: 14 Cans of beer per week    Comment: back up to 1-2 beers per day   Drug use: No     Medication list has been reviewed and updated.  Current Meds  Medication Sig   acetaminophen (TYLENOL) 500 MG tablet Take 2 tablets (1,000 mg total) by mouth every 8 (eight) hours as needed.   atorvastatin (LIPITOR) 40 MG tablet TAKE 1 TABLET BY MOUTH DAILY   Cholecalciferol (VITAMIN D3) 2000 units TABS Take 2,000 Units by mouth daily.   citalopram (CELEXA) 20 MG tablet TAKE 1 TABLET BY MOUTH EVERY DAY   Cyanocobalamin (B-12) 500 MCG TABS Take 1 tablet by mouth daily.   FARXIGA 10 MG TABS tablet TAKE 1 TABLET BY MOUTH EVERY DAY   glimepiride (AMARYL) 2 MG tablet TAKE 1 TABLET BY MOUTH EVERY DAY WITH BREAKFAST   losartan-hydrochlorothiazide (HYZAAR) 100-25 MG tablet TAKE 1 TABLET BY MOUTH EVERY DAY   metFORMIN (GLUCOPHAGE) 500 MG tablet Take 2 tablets (1,000 mg total) by mouth 2 (two) times daily with a meal.   sildenafil (VIAGRA) 100 MG tablet Take 1 tablet (100 mg total) by mouth daily as needed for erectile dysfunction. Take two hours prior to intercourse on an empty stomach    PHQ 2/9 Scores 12/25/2020 11/20/2020 10/13/2020 06/21/2020  PHQ - 2 Score 0 0 0 0  PHQ- 9 Score 1 - 0 0    GAD 7 : Generalized Anxiety Score 12/25/2020 10/13/2020 06/21/2020 02/14/2020  Nervous, Anxious, on Edge 0 0 0 0  Control/stop worrying 0 0 0 0  Worry too much - different things 0 0 0 0  Trouble relaxing 0 0 0 0  Restless 0 0 0 0  Easily annoyed or irritable 0 0 0 0  Afraid - awful might happen 0 0 0 0  Total GAD 7 Score 0 0 0 0  Anxiety Difficulty Not difficult at all - Not difficult at all -    BP Readings from Last 3 Encounters:  12/25/20 124/62  12/13/20 138/74  12/11/20 116/67    Physical Exam Vitals and nursing note reviewed.  Constitutional:      General: He is not in acute distress.    Appearance: Normal appearance. He is well-developed.  HENT:      Head: Normocephalic and atraumatic.  Neck:     Vascular: No carotid bruit.  Cardiovascular:     Rate and Rhythm: Normal rate and regular rhythm.     Pulses: Normal pulses.     Heart sounds: No murmur heard. Pulmonary:     Effort: Pulmonary effort is normal. No respiratory distress.     Breath sounds: No wheezing or rhonchi.  Abdominal:     General: Abdomen is flat.     Palpations: Abdomen is soft.  Musculoskeletal:     Cervical back: Normal range of motion.  Right lower leg: No edema.     Left lower leg: No edema.  Lymphadenopathy:     Cervical: No cervical adenopathy.  Skin:    General: Skin is warm and dry.     Findings: No rash.  Neurological:     Mental Status: He is alert and oriented to person, place, and time.  Psychiatric:        Mood and Affect: Mood normal.        Behavior: Behavior normal.    Wt Readings from Last 3 Encounters:  12/25/20 178 lb 6.4 oz (80.9 kg)  12/13/20 170 lb (77.1 kg)  12/11/20 170 lb (77.1 kg)    BP 124/62   Pulse 64   Ht 6\' 2"  (1.88 m)   Wt 178 lb 6.4 oz (80.9 kg)   SpO2 94%   BMI 22.91 kg/m   Assessment and Plan: 1. DM (diabetes mellitus), type 2 with complications (HCC) BS only slightly improved; will increase metformin to 1000 mg bid Continue other meds unchanged - POCT glycosylated hemoglobin (Hb A1C)= 9.0 down from 9.1  2. Prostate cancer ([place]) Clearance form completed - he may proceed with surgery.   Partially dictated using Editor, commissioning. Any errors are unintentional.  Halina Maidens, MD [facility]  12/25/2020

## 2020-12-25 NOTE — Therapy (Signed)
[facility] [address]. [place], [place], [address] Phone: [phone]   Fax:  [phone]  Physical Therapy Treatment  Patient Details  Name: Calvin Sanders MRN: [id number] Date of Birth: [birth year] Referring Provider (PT): Erlene Quan   Encounter Date: 12/25/2020   PT End of Session - 12/25/20 1202     Visit Number 2    Number of Visits 4    Date for PT Re-Evaluation 01/15/21    Authorization Type IE 12/18/2020    PT Start Time 1200    PT Stop Time 1240    PT Time Calculation (min) 40 min    Activity Tolerance Patient tolerated treatment well    Behavior During Therapy [facility] for tasks assessed/performed             Past Medical History:  Diagnosis Date   Alcohol dependence, daily use ([place]) 08/05/2014   Previously 6-8 beers per day, cut back in 2020 to 2 per day; in 09/2018 he is only drinking 2 beers on Sat and Sun   Diabetes mellitus without complication ([place])    Hypertension     Past Surgical History:  Procedure Laterality Date   NO PAST SURGERIES     PROSTATE BIOPSY N/A 10/24/2020   Procedure: PROSTATE BIOPSY;  Surgeon: Abbie Sons, MD;  Location: [facility] ORS;  Service: Urology;  Laterality: N/A;   TRANSRECTAL ULTRASOUND N/A 10/24/2020   Procedure: TRANSRECTAL ULTRASOUND;  Surgeon: Abbie Sons, MD;  Location: [facility] ORS;  Service: Urology;  Laterality: N/A;  Confirmed with Kim in Ultrasound    There were no vitals filed for this visit.   Subjective Assessment - 12/25/20 1202     Subjective Denies any concerns or changes since initial evaluation.    Currently in Pain? No/denies            TREATMENT  Pre-treatment assessment: STRENGTH: MMT   RLE LLE  Hip Flexion 5 5  Hip Extension 5 5  Hip Abduction  5 5  Hip Adduction  5 5  Hip ER  5 5  Hip IR  5 5  Knee Extension 5 5  Knee Flexion 5 5  Dorsiflexion  5 5  Plantarflexion (seated) 5 5   EXTERNAL PELVIC EXAM:  Breath coordination:  present Perineal Mobility Testing: Voluntary Contraction: present; increased duration resulted in gluteal activation via neural overdrive Relaxation: present Perineal movement with sustained increase in IAP ("bear down"): no movement, excessive abdominal activation Perineal movement with rapid increase in IAP ("cough"):  no movement  Neuromuscular Re-education: Supine hooklying diaphragmatic breathing with VCs and TCs for downregulation of the nervous system and improved management of IAP Supine hooklying, TrA activation with exhalation. VCs and TCs to decrease compensatory patterns and minimize aggravation of the lumbar paraspinals. Supine hooklying TrA activation with heel slide for improved postural control and DRAM closure Supine hooklying TrA activation with march for improved postural control and DRAM closure Patient educated on bladder irritants and how they may impact bladder control post-surgically.  Patient's spouse was educated on HEP and bladder irritants as well to assist with retention and application of strategies per patient's request.    Patient educated throughout session on appropriate technique and form using multi-modal cueing, HEP, and activity modification. Patient articulated understanding and returned demonstration.  Patient Response to interventions: Agrees to do exercises AM and PM.   ASSESSMENT Patient presents to clinic with excellent motivation to participate in therapy. Patient demonstrates deficits in bladder behaviors, posture, IAP management,  PFM coordination, and PFM strength. Patient able to achieve deep core foundational exercises with moderate cueing for sequencing during today's session and responded positively to active interventions. Patient will benefit from continued skilled therapeutic intervention to address remaining deficits in bladder behaviors, posture, IAP management, PFM coordination, and PFM strength in order to increase function and improve  overall QOL.          PT Long Term Goals - 12/18/20 1603       PT LONG TERM GOAL #1   Title Patient will demonstrate independence with HEP in order to maximize therapeutic gains and improve carryover from physical therapy sessions to ADLs in the home and community.    Baseline IE: provided    Time 4    Period Weeks    Status New    Target Date 01/15/21      PT LONG TERM GOAL #2   Title Patient will demonstrate circumferential and sequential contraction of >4/5 MMT, > 6 sec hold x10 and 5 consecutive quick flicks with </= 10 min rest between testing bouts, and relaxation of the PFM coordinated with breath for improved management of intra-abdominal pressure and normal bowel and bladder function without the presence of pain nor incontinence in order to improve participation at home and in the community.    Baseline IE: 2/5, 3x    Time 4    Period Weeks    Status New    Target Date 01/15/21      PT LONG TERM GOAL #3   Title Patient will demonstrate improved IAP management as indicated by < 2 finger width DRAM on testing for decreased risk of prolonged SUI s/p prostatectomy.    Baseline IE: 2.5 superior and inferior, 3.5 at umbilicus    Time 4    Period Weeks    Status New    Target Date 01/15/21      PT LONG TERM GOAL #4   Title Patient will indicate regular consumption of at least 64 ounces of water over the course of 24 hours in order to decrease bladder irritants for decreased risk of urinary frequency and incontinence s/p prostatectomy.    Baseline IE: occasional 16 oz of water    Time 4    Period Weeks    Status New    Target Date 01/15/21                   Plan - 12/25/20 1203     Clinical Impression Statement Patient presents to clinic with excellent motivation to participate in therapy. Patient demonstrates deficits in bladder behaviors, posture, IAP management, PFM coordination, and PFM strength. Patient able to achieve deep core foundational exercises  with moderate cueing for sequencing during today's session and responded positively to active interventions. Patient will benefit from continued skilled therapeutic intervention to address remaining deficits in bladder behaviors, posture, IAP management, PFM coordination, and PFM strength in order to increase function and improve overall QOL.    Personal Factors and Comorbidities Comorbidity 3+;Age;Behavior Pattern    Comorbidities DM, HTN, prostate cancer, ED, MDD    Examination-Activity Limitations Continence;Other    Examination-Participation Restrictions Interpersonal Relationship    Stability/Clinical Decision Making Evolving/Moderate complexity    Rehab Potential Good    PT Frequency 1x / week    PT Duration 4 weeks    PT Treatment/Interventions Cryotherapy;Moist Heat;Therapeutic exercise;Neuromuscular re-education;Patient/family education;Manual techniques;Taping    PT Next Visit Plan deep core progression, PFM progression    PT Home Exercise Plan [id number];  [id number](deep core)    Consulted and Agree with Plan of Care Patient             Patient will benefit from skilled therapeutic intervention in order to improve the following deficits and impairments:  Decreased coordination, Postural dysfunction, Decreased strength, Improper body mechanics, Decreased endurance, Decreased activity tolerance  Visit Diagnosis: Muscle weakness (generalized)  Other lack of coordination  Abnormal posture     Problem List Patient Active Problem List   Diagnosis Date Noted   MCI (mild cognitive impairment) 03/19/2017   Major depressive disorder, recurrent episode, moderate (HCC) 03/19/2017   B12 deficiency 05/22/2016   Vitamin D deficiency 02/08/2015   Erectile dysfunction of organic origin 09/06/2014   Family history of prostate cancer 09/06/2014   DM (diabetes mellitus), type 2 with complications ([place]) 29/92/4268   Hyperlipidemia associated with type 2 diabetes mellitus ([place]) 08/05/2014    Tobacco use disorder, severe, in early remission 08/05/2014   Colon polyps 05/14/2014   Chronic prostatitis 05/14/2014   Essential hypertension 05/14/2014   BPH (benign prostatic hyperplasia) 05/14/2014    Myles Gip PT, DPT [phone]  12/25/2020, 12:50 PM  [facility] [address]. [place], [place], [address] Phone: [phone]   Fax:  [phone]  Name: Calvin Sanders MRN: [id number] Date of Birth: [birth year]

## 2021-01-01 ENCOUNTER — Encounter: Payer: BC Managed Care – PPO | Admitting: Physical Therapy

## 2021-01-08 ENCOUNTER — Other Ambulatory Visit: Payer: Self-pay

## 2021-01-08 ENCOUNTER — Encounter: Payer: BC Managed Care – PPO | Admitting: Physical Therapy

## 2021-01-08 ENCOUNTER — Encounter
Admission: RE | Admit: 2021-01-08 | Discharge: 2021-01-08 | Disposition: A | Discharge status: Home / Self Care | Payer: BC Managed Care – PPO | Source: Ambulatory Visit | Attending: Urology | Admitting: Urology

## 2021-01-08 HISTORY — DX: Gastro-esophageal reflux disease without esophagitis: K21.9

## 2021-01-08 HISTORY — DX: Pneumonia, unspecified organism: J18.9

## 2021-01-08 NOTE — Patient Instructions (Addendum)
Your procedure is scheduled on: Monday, December 5 Report to the Registration Desk on the 1st floor of the [facility]. To find out your arrival time, please call [phone] between 1PM - 3PM on: Friday, December 2  REMEMBER: Instructions that are not followed completely may result in serious medical risk, up to and including death; or upon the discretion of your surgeon and anesthesiologist your surgery may need to be rescheduled.  Do not eat or drink after midnight the night before surgery.  No gum chewing, lozengers or hard candies.  TAKE THESE MEDICATIONS THE MORNING OF SURGERY WITH A SIP OF WATER:  Atorvastatin (Lipitor) Citalopram (Celexa)  Hold Farxiga 3 days prior to surgery. Last day to take Wilder Glade is Thursday, December 1. Resume AFTER surgery.  Hold Metformin 2 days prior to surgery. Last day to take METFORMIN is Friday, December 2. Resume AFTER surgery.  One week prior to surgery: Stop Anti-inflammatories (NSAIDS) such as Advil, Aleve, Ibuprofen, Motrin, Naproxen, Naprosyn and Aspirin based products such as Excedrin, Goodys Powder, BC Powder. Stop ANY OVER THE COUNTER supplements until after surgery. You may however, continue to take Tylenol if needed for pain up until the day of surgery.  No Alcohol for 24 hours before or after surgery.  No Smoking including e-cigarettes for 24 hours prior to surgery.  No chewable tobacco products for at least 6 hours prior to surgery.  No nicotine patches on the day of surgery.  Do not use any "recreational" drugs for at least a week prior to your surgery.  Please be advised that the combination of cocaine and anesthesia may have negative outcomes, up to and including death. If you test positive for cocaine, your surgery will be cancelled.  On the morning of surgery brush your teeth with toothpaste and water, you may rinse your mouth with mouthwash if you wish. Do not swallow any toothpaste or mouthwash.  Use CHG Soap as  directed on instruction sheet.  Do not wear jewelry, make-up, hairpins, clips or nail polish.  Do not wear lotions, powders, or perfumes.   Do not shave body from the neck down 48 hours prior to surgery just in case you cut yourself which could leave a site for infection.  Also, freshly shaved skin may become irritated if using the CHG soap.  Contact lenses, hearing aids and dentures may not be worn into surgery.  Do not bring valuables to the hospital. [facility] is not responsible for any missing/lost belongings or valuables.   Notify your doctor if there is any change in your medical condition (cold, fever, infection).  Wear comfortable clothing (specific to your surgery type) to the hospital.  After surgery, you can help prevent lung complications by doing breathing exercises.  Take deep breaths and cough every 1-2 hours. Your doctor may order a device called an Incentive Spirometer to help you take deep breaths. When coughing or sneezing, hold a pillow firmly against your incision with both hands. This is called "splinting." Doing this helps protect your incision. It also decreases belly discomfort.  If you are being admitted to the hospital overnight, leave your suitcase in the car. After surgery it may be brought to your room.  If you are being discharged the day of surgery, you will not be allowed to drive home. You will need a responsible adult (18 years or older) to drive you home and stay with you that night.   If you are taking public transportation, you will need to have  a responsible adult (18 years or older) with you. Please confirm with your physician that it is acceptable to use public transportation.   Please call the [place] [facility]. at [phone] if you have any questions about these instructions.  Surgery Visitation Policy:  Patients undergoing a surgery or procedure may have one family member or support person with them as long as that person  is not COVID-19 positive or experiencing its symptoms.  That person may remain in the waiting area during the procedure and may rotate out with other people.  Inpatient Visitation:    Visiting hours are 7 a.m. to 8 p.m. Up to two visitors ages 16+ are allowed at one time in a patient room. The visitors may rotate out with other people during the day. Visitors must check out when they leave, or other visitors will not be allowed. One designated support person may remain overnight. The visitor must pass COVID-19 screenings, use hand sanitizer when entering and exiting the patient's room and wear a mask at all times, including in the patient's room. Patients must also wear a mask when staff or their visitor are in the room. Masking is required regardless of vaccination status.

## 2021-01-09 ENCOUNTER — Encounter
Admission: RE | Admit: 2021-01-09 | Discharge: 2021-01-09 | Disposition: A | Discharge status: Home / Self Care | Payer: BC Managed Care – PPO | Source: Ambulatory Visit | Attending: Urology | Admitting: Urology

## 2021-01-09 DIAGNOSIS — C61 Malignant neoplasm of prostate: Secondary | ICD-10-CM | POA: Diagnosis not present

## 2021-01-09 DIAGNOSIS — Z01812 Encounter for preprocedural laboratory examination: Secondary | ICD-10-CM | POA: Diagnosis not present

## 2021-01-09 LAB — URINALYSIS, COMPLETE (UACMP) WITH MICROSCOPIC
Bacteria, UA: NONE SEEN
Bilirubin Urine: NEGATIVE
Glucose, UA: >1000 mg/dL — AB
Hgb urine dipstick: NEGATIVE
Ketones, ur: NEGATIVE mg/dL
Leukocytes,Ua: NEGATIVE
Nitrite: NEGATIVE
Protein, ur: NEGATIVE mg/dL
Specific Gravity, Urine: 1.02 (ref 1.005–1.030)
pH: 7 (ref 5.0–8.0)

## 2021-01-09 LAB — BASIC METABOLIC PANEL
Anion gap: 8 (ref 5–15)
BUN: 17 mg/dL (ref 8–23)
CO2: 27 mmol/L (ref 22–32)
Calcium: 10.2 mg/dL (ref 8.9–10.3)
Chloride: 103 mmol/L (ref 98–111)
Creatinine, Ser: 0.93 mg/dL (ref 0.61–1.24)
GFR, Estimated: >60 mL/min (ref >60)
Glucose, Bld: 140 mg/dL — ABNORMAL HIGH (ref 70–99)
Potassium: 4 mmol/L (ref 3.5–5.1)
Sodium: 138 mmol/L (ref 135–145)

## 2021-01-09 LAB — CBC
HCT: 45 % (ref 39.0–52.0)
Hemoglobin: 15.5 g/dL (ref 13.0–17.0)
MCH: 33.3 pg (ref 26.0–34.0)
MCHC: 34.4 g/dL (ref 30.0–36.0)
MCV: 96.6 fL (ref 80.0–100.0)
Platelets: 229 10*3/uL (ref 150–400)
RBC: 4.66 MIL/uL (ref 4.22–5.81)
RDW: 12.6 % (ref 11.5–15.5)
WBC: 6.6 10*3/uL (ref 4.0–10.5)
nRBC: 0 % (ref 0.0–0.2)

## 2021-01-09 LAB — TYPE AND SCREEN
ABO/RH(D): A POS
Antibody Screen: NEGATIVE

## 2021-01-09 LAB — PROTIME-INR
INR: 1 (ref 0.8–1.2)
Prothrombin Time: 12.6 seconds (ref 11.4–15.2)

## 2021-01-10 LAB — URINE CULTURE: Culture: NO GROWTH

## 2021-01-11 ENCOUNTER — Other Ambulatory Visit
Admission: RE | Admit: 2021-01-11 | Discharge: 2021-01-11 | Disposition: A | Discharge status: Home / Self Care | Payer: Medicare Other | Source: Ambulatory Visit | Attending: Urology | Admitting: Urology

## 2021-01-11 ENCOUNTER — Other Ambulatory Visit: Payer: Self-pay

## 2021-01-11 DIAGNOSIS — Z20822 Contact with and (suspected) exposure to covid-19: Secondary | ICD-10-CM | POA: Insufficient documentation

## 2021-01-11 DIAGNOSIS — Z01812 Encounter for preprocedural laboratory examination: Secondary | ICD-10-CM | POA: Insufficient documentation

## 2021-01-12 LAB — SARS CORONAVIRUS 2 (TAT 6-24 HRS): SARS Coronavirus 2: NEGATIVE

## 2021-01-15 ENCOUNTER — Encounter
Admission: RE | Disposition: A | Discharge status: Home / Self Care | Payer: Self-pay | Source: Home / Self Care | Attending: Urology

## 2021-01-15 ENCOUNTER — Encounter: Payer: BC Managed Care – PPO | Admitting: Physical Therapy

## 2021-01-15 ENCOUNTER — Encounter: Payer: Self-pay | Admitting: Urology

## 2021-01-15 ENCOUNTER — Ambulatory Visit: Payer: Medicare Other | Admitting: Anesthesiology

## 2021-01-15 ENCOUNTER — Other Ambulatory Visit: Payer: Self-pay

## 2021-01-15 ENCOUNTER — Observation Stay
Admission: RE | Admit: 2021-01-15 | Discharge: 2021-01-16 | Disposition: A | Discharge status: Home / Self Care | Payer: Medicare Other | Attending: Urology | Admitting: Urology

## 2021-01-15 DIAGNOSIS — E119 Type 2 diabetes mellitus without complications: Secondary | ICD-10-CM | POA: Diagnosis not present

## 2021-01-15 DIAGNOSIS — Z87891 Personal history of nicotine dependence: Secondary | ICD-10-CM | POA: Diagnosis not present

## 2021-01-15 DIAGNOSIS — I10 Essential (primary) hypertension: Secondary | ICD-10-CM | POA: Diagnosis not present

## 2021-01-15 DIAGNOSIS — C61 Malignant neoplasm of prostate: Secondary | ICD-10-CM | POA: Diagnosis not present

## 2021-01-15 HISTORY — PX: PELVIC LYMPH NODE DISSECTION: SHX6543

## 2021-01-15 HISTORY — PX: ROBOT ASSISTED LAPAROSCOPIC RADICAL PROSTATECTOMY: SHX5141

## 2021-01-15 LAB — GLUCOSE, CAPILLARY
Glucose-Capillary: 267 mg/dL — ABNORMAL HIGH (ref 70–99)
Glucose-Capillary: 292 mg/dL — ABNORMAL HIGH (ref 70–99)
Glucose-Capillary: 297 mg/dL — ABNORMAL HIGH (ref 70–99)
Glucose-Capillary: 292 mg/dL — ABNORMAL HIGH (ref 70–99)
Glucose-Capillary: 251 mg/dL — ABNORMAL HIGH (ref 70–99)
Glucose-Capillary: 205 mg/dL — ABNORMAL HIGH (ref 70–99)
Glucose-Capillary: 259 mg/dL — ABNORMAL HIGH (ref 70–99)

## 2021-01-15 LAB — ABO/RH: ABO/RH(D): A POS

## 2021-01-15 SURGERY — PROSTATECTOMY, RADICAL, ROBOT-ASSISTED, LAPAROSCOPIC
Anesthesia: General

## 2021-01-15 MED ORDER — FENTANYL CITRATE (PF) 100 MCG/2ML IJ SOLN
25.0000 ug | INTRAMUSCULAR | Status: DC | PRN
  Administered 2021-01-15: 50 ug via INTRAVENOUS

## 2021-01-15 MED ORDER — HYDROCHLOROTHIAZIDE 25 MG PO TABS
25.0000 mg | ORAL_TABLET | Freq: Every day | ORAL | Status: DC
  Administered 2021-01-15 – 2021-01-16 (×2): 25 mg via ORAL
  Filled 2021-01-15 (×2): qty 1

## 2021-01-15 MED ORDER — CHLORHEXIDINE GLUCONATE 0.12 % MT SOLN: 15.0000 mL | Freq: Once | OROMUCOSAL | Status: AC

## 2021-01-15 MED ORDER — ONDANSETRON HCL 4 MG/2ML IJ SOLN
INTRAMUSCULAR | Status: DC | PRN
  Administered 2021-01-15: 4 mg via INTRAVENOUS

## 2021-01-15 MED ORDER — ORAL CARE MOUTH RINSE: 15.0000 mL | Freq: Once | OROMUCOSAL | Status: AC

## 2021-01-15 MED ORDER — MORPHINE SULFATE (PF) 2 MG/ML IV SOLN: 2.0000 mg | INTRAVENOUS | Status: DC | PRN

## 2021-01-15 MED ORDER — PROPOFOL 10 MG/ML IV BOLUS
INTRAVENOUS | Status: DC | PRN
  Administered 2021-01-15: 160 mg via INTRAVENOUS

## 2021-01-15 MED ORDER — CEFAZOLIN SODIUM-DEXTROSE 2-4 GM/100ML-% IV SOLN
INTRAVENOUS | Status: AC
  Filled 2021-01-15: qty 100

## 2021-01-15 MED ORDER — DOCUSATE SODIUM 100 MG PO CAPS
100.0000 mg | ORAL_CAPSULE | Freq: Two times a day (BID) | ORAL | Status: DC
  Administered 2021-01-15 – 2021-01-16 (×2): 100 mg via ORAL
  Filled 2021-01-15 (×2): qty 1

## 2021-01-15 MED ORDER — OXYCODONE HCL 5 MG PO TABS: 5.0000 mg | ORAL_TABLET | Freq: Once | ORAL | Status: DC | PRN

## 2021-01-15 MED ORDER — HYDROMORPHONE HCL 1 MG/ML IJ SOLN
INTRAMUSCULAR | Status: DC | PRN
  Administered 2021-01-15 (×2): .5 mg via INTRAVENOUS

## 2021-01-15 MED ORDER — INSULIN ASPART 100 UNIT/ML IV SOLN: 8.0000 [IU] | Freq: Once | INTRAVENOUS | Status: DC

## 2021-01-15 MED ORDER — ACETAMINOPHEN 10 MG/ML IV SOLN
INTRAVENOUS | Status: DC | PRN
  Administered 2021-01-15: 1000 mg via INTRAVENOUS

## 2021-01-15 MED ORDER — DEXAMETHASONE SODIUM PHOSPHATE 10 MG/ML IJ SOLN
INTRAMUSCULAR | Status: AC
  Filled 2021-01-15: qty 1

## 2021-01-15 MED ORDER — FAMOTIDINE 20 MG PO TABS
ORAL_TABLET | ORAL | Status: AC
  Administered 2021-01-15: 20 mg via ORAL
  Filled 2021-01-15: qty 1

## 2021-01-15 MED ORDER — FENTANYL CITRATE (PF) 100 MCG/2ML IJ SOLN
INTRAMUSCULAR | Status: AC
  Administered 2021-01-15: 50 ug via INTRAVENOUS
  Filled 2021-01-15: qty 2

## 2021-01-15 MED ORDER — INSULIN ASPART 100 UNIT/ML IJ SOLN
INTRAMUSCULAR | Status: AC
  Administered 2021-01-15: 8 [IU] via SUBCUTANEOUS
  Filled 2021-01-15: qty 1

## 2021-01-15 MED ORDER — 0.9 % SODIUM CHLORIDE (POUR BTL) OPTIME
TOPICAL | Status: DC | PRN
  Administered 2021-01-15: 400 mL

## 2021-01-15 MED ORDER — HYDROMORPHONE HCL 1 MG/ML IJ SOLN
INTRAMUSCULAR | Status: AC
  Filled 2021-01-15: qty 1

## 2021-01-15 MED ORDER — CEFAZOLIN SODIUM-DEXTROSE 1-4 GM/50ML-% IV SOLN
1.0000 g | Freq: Three times a day (TID) | INTRAVENOUS | Status: AC
  Administered 2021-01-15 – 2021-01-16 (×2): 1 g via INTRAVENOUS
  Filled 2021-01-15 (×2): qty 50

## 2021-01-15 MED ORDER — FENTANYL CITRATE (PF) 100 MCG/2ML IJ SOLN
INTRAMUSCULAR | Status: AC
  Filled 2021-01-15: qty 2

## 2021-01-15 MED ORDER — OXYBUTYNIN CHLORIDE 5 MG PO TABS
ORAL_TABLET | ORAL | Status: AC
  Filled 2021-01-15: qty 1

## 2021-01-15 MED ORDER — LIDOCAINE HCL (CARDIAC) PF 100 MG/5ML IV SOSY
PREFILLED_SYRINGE | INTRAVENOUS | Status: DC | PRN
  Administered 2021-01-15: 100 mg via INTRAVENOUS

## 2021-01-15 MED ORDER — ROCURONIUM BROMIDE 10 MG/ML (PF) SYRINGE
PREFILLED_SYRINGE | INTRAVENOUS | Status: AC
  Filled 2021-01-15: qty 10

## 2021-01-15 MED ORDER — OXYCODONE-ACETAMINOPHEN 5-325 MG PO TABS
1.0000 | ORAL_TABLET | ORAL | Status: DC | PRN
  Administered 2021-01-15: 2 via ORAL
  Filled 2021-01-15: qty 2

## 2021-01-15 MED ORDER — DEXAMETHASONE SODIUM PHOSPHATE 10 MG/ML IJ SOLN
INTRAMUSCULAR | Status: DC | PRN
Start: 2021-01-15 — End: 2021-01-15
  Administered 2021-01-15: 5 mg via INTRAVENOUS

## 2021-01-15 MED ORDER — BUPIVACAINE HCL 0.5 % IJ SOLN
INTRAMUSCULAR | Status: DC | PRN
  Administered 2021-01-15: 20 mL

## 2021-01-15 MED ORDER — INSULIN ASPART 100 UNIT/ML IJ SOLN
4.0000 [IU] | Freq: Three times a day (TID) | INTRAMUSCULAR | Status: DC
  Administered 2021-01-16 (×2): 4 [IU] via SUBCUTANEOUS
  Filled 2021-01-15 (×2): qty 1

## 2021-01-15 MED ORDER — CEFAZOLIN SODIUM-DEXTROSE 2-4 GM/100ML-% IV SOLN
2.0000 g | INTRAVENOUS | Status: AC
  Administered 2021-01-15: 2 g via INTRAVENOUS

## 2021-01-15 MED ORDER — ONDANSETRON HCL 4 MG/2ML IJ SOLN
INTRAMUSCULAR | Status: AC
  Filled 2021-01-15: qty 2

## 2021-01-15 MED ORDER — FAMOTIDINE 20 MG PO TABS: 20.0000 mg | ORAL_TABLET | Freq: Once | ORAL | Status: AC

## 2021-01-15 MED ORDER — ACETAMINOPHEN 10 MG/ML IV SOLN: 1000.0000 mg | Freq: Once | INTRAVENOUS | Status: DC | PRN

## 2021-01-15 MED ORDER — SURGIFLO WITH THROMBIN (HEMOSTATIC MATRIX KIT) OPTIME
TOPICAL | Status: DC | PRN
  Administered 2021-01-15: 1 via TOPICAL

## 2021-01-15 MED ORDER — PROPOFOL 10 MG/ML IV BOLUS
INTRAVENOUS | Status: AC
  Filled 2021-01-15: qty 20

## 2021-01-15 MED ORDER — PHENYLEPHRINE HCL (PRESSORS) 10 MG/ML IV SOLN
INTRAVENOUS | Status: AC
  Filled 2021-01-15: qty 1

## 2021-01-15 MED ORDER — BUPIVACAINE HCL (PF) 0.5 % IJ SOLN
INTRAMUSCULAR | Status: AC
  Filled 2021-01-15: qty 30

## 2021-01-15 MED ORDER — INSULIN ASPART 100 UNIT/ML IJ SOLN
0.0000 [IU] | Freq: Three times a day (TID) | INTRAMUSCULAR | Status: DC
  Administered 2021-01-16: 5 [IU] via SUBCUTANEOUS
  Administered 2021-01-16: 8 [IU] via SUBCUTANEOUS
  Filled 2021-01-15 (×2): qty 1

## 2021-01-15 MED ORDER — ACETAMINOPHEN 10 MG/ML IV SOLN
INTRAVENOUS | Status: AC
  Filled 2021-01-15: qty 100

## 2021-01-15 MED ORDER — BELLADONNA ALKALOIDS-OPIUM 16.2-60 MG RE SUPP
RECTAL | Status: AC
  Administered 2021-01-15: 1 via RECTAL
  Filled 2021-01-15: qty 1

## 2021-01-15 MED ORDER — ACETAMINOPHEN 325 MG PO TABS: 650.0000 mg | ORAL_TABLET | ORAL | Status: DC | PRN

## 2021-01-15 MED ORDER — HEPARIN SODIUM (PORCINE) 5000 UNIT/ML IJ SOLN
5000.0000 [IU] | Freq: Three times a day (TID) | INTRAMUSCULAR | Status: DC
  Administered 2021-01-15 – 2021-01-16 (×3): 5000 [IU] via SUBCUTANEOUS
  Filled 2021-01-15 (×3): qty 1

## 2021-01-15 MED ORDER — DIPHENHYDRAMINE HCL 50 MG/ML IJ SOLN: 12.5000 mg | Freq: Four times a day (QID) | INTRAMUSCULAR | Status: DC | PRN

## 2021-01-15 MED ORDER — OXYCODONE HCL 5 MG/5ML PO SOLN: 5.0000 mg | Freq: Once | ORAL | Status: DC | PRN

## 2021-01-15 MED ORDER — MIDAZOLAM HCL 2 MG/2ML IJ SOLN
INTRAMUSCULAR | Status: DC | PRN
  Administered 2021-01-15: 2 mg via INTRAVENOUS

## 2021-01-15 MED ORDER — SEVOFLURANE IN SOLN
RESPIRATORY_TRACT | Status: AC
  Filled 2021-01-15: qty 250

## 2021-01-15 MED ORDER — MIDAZOLAM HCL 2 MG/2ML IJ SOLN
INTRAMUSCULAR | Status: AC
  Filled 2021-01-15: qty 2

## 2021-01-15 MED ORDER — SODIUM CHLORIDE 0.9 % IV SOLN: INTRAVENOUS | Status: DC

## 2021-01-15 MED ORDER — DIPHENHYDRAMINE HCL 12.5 MG/5ML PO ELIX
12.5000 mg | ORAL_SOLUTION | Freq: Four times a day (QID) | ORAL | Status: DC | PRN
  Filled 2021-01-15: qty 5

## 2021-01-15 MED ORDER — FENTANYL CITRATE (PF) 100 MCG/2ML IJ SOLN
INTRAMUSCULAR | Status: DC | PRN
  Administered 2021-01-15 (×4): 50 ug via INTRAVENOUS

## 2021-01-15 MED ORDER — CHLORHEXIDINE GLUCONATE 0.12 % MT SOLN
OROMUCOSAL | Status: AC
  Administered 2021-01-15: 15 mL via OROMUCOSAL
  Filled 2021-01-15: qty 15

## 2021-01-15 MED ORDER — INSULIN ASPART 100 UNIT/ML IJ SOLN
0.0000 [IU] | Freq: Every day | INTRAMUSCULAR | Status: DC
  Administered 2021-01-15: 3 [IU] via SUBCUTANEOUS
  Filled 2021-01-15: qty 1

## 2021-01-15 MED ORDER — BELLADONNA ALKALOIDS-OPIUM 16.2-60 MG RE SUPP: 1.0000 | Freq: Four times a day (QID) | RECTAL | Status: DC | PRN

## 2021-01-15 MED ORDER — LOSARTAN POTASSIUM-HCTZ 100-25 MG PO TABS: 1.0000 | ORAL_TABLET | Freq: Every day | ORAL | Status: DC

## 2021-01-15 MED ORDER — LIDOCAINE HCL (PF) 2 % IJ SOLN
INTRAMUSCULAR | Status: AC
  Filled 2021-01-15: qty 5

## 2021-01-15 MED ORDER — ONDANSETRON HCL 4 MG/2ML IJ SOLN: 4.0000 mg | Freq: Once | INTRAMUSCULAR | Status: DC | PRN

## 2021-01-15 MED ORDER — CITALOPRAM HYDROBROMIDE 20 MG PO TABS
20.0000 mg | ORAL_TABLET | Freq: Every day | ORAL | Status: DC
  Administered 2021-01-16: 20 mg via ORAL
  Filled 2021-01-15: qty 1

## 2021-01-15 MED ORDER — ROCURONIUM BROMIDE 100 MG/10ML IV SOLN
INTRAVENOUS | Status: DC | PRN
  Administered 2021-01-15 (×3): 20 mg via INTRAVENOUS
  Administered 2021-01-15: 30 mg via INTRAVENOUS
  Administered 2021-01-15: 50 mg via INTRAVENOUS
  Administered 2021-01-15: 20 mg via INTRAVENOUS

## 2021-01-15 MED ORDER — LACTATED RINGERS IR SOLN
Status: DC | PRN
  Administered 2021-01-15: 600 mL

## 2021-01-15 MED ORDER — ONDANSETRON HCL 4 MG/2ML IJ SOLN: 4.0000 mg | INTRAMUSCULAR | Status: DC | PRN

## 2021-01-15 MED ORDER — SUGAMMADEX SODIUM 200 MG/2ML IV SOLN
INTRAVENOUS | Status: DC | PRN
  Administered 2021-01-15: 200 mg via INTRAVENOUS

## 2021-01-15 MED ORDER — THROMBIN 5000 UNITS EX SOLR
CUTANEOUS | Status: AC
  Filled 2021-01-15: qty 5000

## 2021-01-15 MED ORDER — ATORVASTATIN CALCIUM 20 MG PO TABS
40.0000 mg | ORAL_TABLET | Freq: Every day | ORAL | Status: DC
  Administered 2021-01-16: 40 mg via ORAL
  Filled 2021-01-15: qty 2

## 2021-01-15 MED ORDER — OXYBUTYNIN CHLORIDE 5 MG PO TABS
5.0000 mg | ORAL_TABLET | Freq: Three times a day (TID) | ORAL | Status: DC | PRN
  Administered 2021-01-15: 5 mg via ORAL

## 2021-01-15 MED ORDER — INSULIN REGULAR(HUMAN) IN NACL 100-0.9 UT/100ML-% IV SOLN
INTRAVENOUS | Status: DC
  Administered 2021-01-15: 1 [IU]/h via INTRAVENOUS
  Filled 2021-01-15 (×2): qty 100

## 2021-01-15 MED ORDER — INSULIN ASPART 100 UNIT/ML IV SOLN: 8.0000 [IU] | Freq: Once | INTRAVENOUS | Status: AC

## 2021-01-15 MED ORDER — LOSARTAN POTASSIUM 50 MG PO TABS
100.0000 mg | ORAL_TABLET | Freq: Every day | ORAL | Status: DC
  Administered 2021-01-15 – 2021-01-16 (×2): 100 mg via ORAL
  Filled 2021-01-15 (×2): qty 2

## 2021-01-15 SURGICAL SUPPLY — 89 items
ANCHOR TIS RET SYS 235ML (MISCELLANEOUS) ×3 IMPLANT
APPLICATOR SURGIFLO ENDO (HEMOSTASIS) ×3 IMPLANT
APPLIER CLIP LOGIC TI 5 (MISCELLANEOUS) IMPLANT
BAG URINE DRAIN 2000ML AR STRL (UROLOGICAL SUPPLIES) ×3 IMPLANT
BLADE CLIPPER SURG (BLADE) ×3 IMPLANT
BULB RESERV EVAC DRAIN JP 100C (MISCELLANEOUS) IMPLANT
CATH DRAINAGE MALECOT 26FR (CATHETERS) ×2 IMPLANT
CATH FOL 2WAY LX 18X30 (CATHETERS) ×3 IMPLANT
CATH FOL 2WAY LX 18X5 (CATHETERS) ×3 IMPLANT
CATH MALECOT (CATHETERS) ×3
CHLORAPREP W/TINT 26 (MISCELLANEOUS) IMPLANT
CLIP LIGATING HEM O LOK PURPLE (MISCELLANEOUS) ×12 IMPLANT
COVER TIP SHEARS 8 DVNC (MISCELLANEOUS) ×2 IMPLANT
COVER TIP SHEARS 8MM DA VINCI (MISCELLANEOUS) ×1
DEFOGGER SCOPE WARMER CLEARIFY (MISCELLANEOUS) IMPLANT
DERMABOND ADVANCED (GAUZE/BANDAGES/DRESSINGS) ×1
DERMABOND ADVANCED .7 DNX12 (GAUZE/BANDAGES/DRESSINGS) ×2 IMPLANT
DRAIN CHANNEL JP 15F RND 16 (MISCELLANEOUS) IMPLANT
DRAIN CHANNEL JP 19F (MISCELLANEOUS) IMPLANT
DRAPE 3/4 80X56 (DRAPES) ×3 IMPLANT
DRAPE ARM DVNC X/XI (DISPOSABLE) ×8 IMPLANT
DRAPE COLUMN DVNC XI (DISPOSABLE) ×2 IMPLANT
DRAPE DA VINCI XI ARM (DISPOSABLE) ×4
DRAPE DA VINCI XI COLUMN (DISPOSABLE) ×1
DRAPE LEGGINS SURG 28X43 STRL (DRAPES) ×3 IMPLANT
DRAPE SURG 17X11 SM STRL (DRAPES) ×12 IMPLANT
DRAPE UNDER BUTTOCK W/FLU (DRAPES) ×3 IMPLANT
DRSG TELFA 3X8 NADH (GAUZE/BANDAGES/DRESSINGS) IMPLANT
ELECT REM PT RETURN 9FT ADLT (ELECTROSURGICAL) ×3
ELECTRODE REM PT RTRN 9FT ADLT (ELECTROSURGICAL) ×2 IMPLANT
GAUZE 4X4 16PLY ~~LOC~~+RFID DBL (SPONGE) ×3 IMPLANT
GLOVE SURG ENC MOIS LTX SZ6.5 (GLOVE) ×6 IMPLANT
GLOVE SURG UNDER POLY LF SZ7.5 (GLOVE) ×3 IMPLANT
GOWN STRL REUS W/ TWL LRG LVL3 (GOWN DISPOSABLE) ×4 IMPLANT
GOWN STRL REUS W/ TWL XL LVL3 (GOWN DISPOSABLE) ×2 IMPLANT
GOWN STRL REUS W/TWL LRG LVL3 (GOWN DISPOSABLE) ×2
GOWN STRL REUS W/TWL XL LVL3 (GOWN DISPOSABLE) ×1
GRASPER SUT TROCAR 14GX15 (MISCELLANEOUS) ×3 IMPLANT
HEMOSTAT SURGICEL 2X14 (HEMOSTASIS) IMPLANT
HOLDER FOLEY CATH W/STRAP (MISCELLANEOUS) ×3 IMPLANT
IRRIGATION STRYKERFLOW (MISCELLANEOUS) ×2 IMPLANT
IRRIGATOR STRYKERFLOW (MISCELLANEOUS) ×3
IV LACTATED RINGERS 1000ML (IV SOLUTION) ×3 IMPLANT
KIT PINK PAD W/HEAD ARE REST (MISCELLANEOUS) ×3
KIT PINK PAD W/HEAD ARM REST (MISCELLANEOUS) ×2 IMPLANT
LABEL OR SOLS (LABEL) ×3 IMPLANT
MANIFOLD NEPTUNE II (INSTRUMENTS) ×3 IMPLANT
MARKER SKIN DUAL TIP RULER LAB (MISCELLANEOUS) ×3 IMPLANT
NEEDLE HYPO 22GX1.5 SAFETY (NEEDLE) ×3 IMPLANT
NEEDLE INSUFFLATION 14GA 120MM (NEEDLE) ×3 IMPLANT
NS IRRIG 500ML POUR BTL (IV SOLUTION) ×3 IMPLANT
OBTURATOR OPTICAL STANDARD 8MM (TROCAR) ×1
OBTURATOR OPTICAL STND 8 DVNC (TROCAR) ×2
OBTURATOR OPTICALSTD 8 DVNC (TROCAR) ×2 IMPLANT
PACK LAP CHOLECYSTECTOMY (MISCELLANEOUS) ×3 IMPLANT
PENCIL ELECTRO HAND CTR (MISCELLANEOUS) ×3 IMPLANT
RELOAD STAPLER WHITE 60MM (STAPLE) ×2 IMPLANT
SEAL CANN UNIV 5-8 DVNC XI (MISCELLANEOUS) ×8 IMPLANT
SEAL XI 5MM-8MM UNIVERSAL (MISCELLANEOUS) ×4
SET TUBE SMOKE EVAC HIGH FLOW (TUBING) ×3 IMPLANT
SOLUTION ELECTROLUBE (MISCELLANEOUS) ×3 IMPLANT
SPONGE T-LAP 4X18 ~~LOC~~+RFID (SPONGE) ×3 IMPLANT
SPONGE VERSALON 4X4 4PLY (MISCELLANEOUS) IMPLANT
STAPLE ECHEON FLEX 60 POW ENDO (STAPLE) ×3 IMPLANT
STAPLER RELOAD WHITE 60MM (STAPLE) ×3
STAPLER SKIN PROX 35W (STAPLE) ×3 IMPLANT
STRAP SAFETY 5IN WIDE (MISCELLANEOUS) ×6 IMPLANT
SURGIFLO W/THROMBIN 8M KIT (HEMOSTASIS) ×3 IMPLANT
SURGILUBE 2OZ TUBE FLIPTOP (MISCELLANEOUS) ×3 IMPLANT
SUT DVC VLOC 90 3-0 CV23 UNDY (SUTURE) ×6 IMPLANT
SUT DVC VLOC 90 3-0 CV23 VLT (SUTURE) ×3
SUT ETHILON 3-0 FS-10 30 BLK (SUTURE)
SUT MNCRL 4-0 (SUTURE) ×2
SUT MNCRL 4-0 27XMFL (SUTURE) ×4
SUT SILK 2 0 SH (SUTURE) IMPLANT
SUT VIC AB 0 CT1 36 (SUTURE) ×6 IMPLANT
SUT VIC AB 2-0 SH 27 (SUTURE)
SUT VIC AB 2-0 SH 27XBRD (SUTURE) IMPLANT
SUT VICRYL 0 AB UR-6 (SUTURE) ×3 IMPLANT
SUTURE DVC VLC 90 3-0 CV23 VLT (SUTURE) ×2 IMPLANT
SUTURE EHLN 3-0 FS-10 30 BLK (SUTURE) IMPLANT
SUTURE MNCRL 4-0 27XMF (SUTURE) ×4 IMPLANT
SYR 10ML LL (SYRINGE) ×3 IMPLANT
SYR BULB IRRIG 60ML STRL (SYRINGE) ×3 IMPLANT
SYR TOOMEY 50ML (SYRINGE) ×6 IMPLANT
TAPE CLOTH 3X10 WHT NS LF (GAUZE/BANDAGES/DRESSINGS) ×6 IMPLANT
TROCAR XCEL 12X100 BLDLESS (ENDOMECHANICALS) ×3 IMPLANT
TROCAR XCEL NON-BLD 5MMX100MML (ENDOMECHANICALS) ×3 IMPLANT
WATER STERILE IRR 500ML POUR (IV SOLUTION) ×3 IMPLANT

## 2021-01-15 NOTE — Anesthesia Procedure Notes (Signed)
Procedure Name: Intubation Date/Time: 01/15/2021 7:53 AM Performed by: Aline Brochure, CRNA Pre-anesthesia Checklist: Patient identified, Patient being monitored, Timeout performed, Emergency Drugs available and Suction available Patient Re-evaluated:Patient Re-evaluated prior to induction Oxygen Delivery Method: Circle system utilized Preoxygenation: Pre-oxygenation with 100% oxygen Induction Type: IV induction Ventilation: Mask ventilation without difficulty Laryngoscope Size: Mac and 3 Grade View: Grade I Tube type: Oral Tube size: 7.0 mm Number of attempts: 1 Airway Equipment and Method: Stylet and Video-laryngoscopy Placement Confirmation: ETT inserted through vocal cords under direct vision, positive ETCO2 and breath sounds checked- equal and bilateral Secured at: 22 cm Tube secured with: Tape Dental Injury: Teeth and Oropharynx as per pre-operative assessment

## 2021-01-15 NOTE — Anesthesia Postprocedure Evaluation (Signed)
Anesthesia Post Note  Patient: Calvin Sanders  Procedure(s) Performed: XI ROBOTIC ASSISTED LAPAROSCOPIC RADICAL PROSTATECTOMY PELVIC LYMPH NODE DISSECTION (Bilateral)  Patient location during evaluation: PACU Anesthesia Type: General Level of consciousness: awake and alert Pain management: pain level controlled Vital Signs Assessment: post-procedure vital signs reviewed and stable Respiratory status: spontaneous breathing, nonlabored ventilation and respiratory function stable Cardiovascular status: blood pressure returned to baseline and stable Postop Assessment: no apparent nausea or vomiting Anesthetic complications: no   No notable events documented.   Last Vitals:  Vitals:   01/15/21 1600 01/15/21 1654  BP: 138/76 (!) 153/72  Pulse: 61 64  Resp: 16 18  Temp: 36.4 C 37.1 C  SpO2: 98% 98%    Last Pain:  Vitals:   01/15/21 1654  TempSrc: Oral  PainSc:                  [place] Ouch

## 2021-01-15 NOTE — Op Note (Signed)
01/15/21  PREOPERATIVE DIAGNOSIS: Prostate cancer.  POSTOPERATIVE DIAGNOSIS: Prostate cancer.  OPERATION PERFORMED: 1. DaVinci laparoscopic radical prostatectomy (bilateral nerve sparing) 2 DaVinci laproscopic bilateral pelvic lymph node dissection.  SURGEON: Hollice Espy, MD  ASSISTANTS: Nickolas Madrid, MD  ANESTHESIA: General.  EBL: 100 cc  SPECIMEN: Prostate with bilateral seminal vesicals, bilateral pelvic lymph nodes, anterior fat pad.  FINDINGS: Accessory Pudendal Vessel: none  INDICATION: Pt.is 65 year old male with Gleason 4+3 prostate cancer. Treatment options were discussed with him at length and he chose DaVinci radical prostatectomy.  Poor baseline erections but still plan for nerve sparing was discussed. Bilateral pelvic lymph node dissection was planned due to his risk stratification.  PROCEDURE IN DETAIL: Patient was given appropriate perioperative antibiotics. He had sequential compression devices applied preoperatively for DVT prophylaxis. He was taken to the operating room where he was induced with general anesthesia. After adequate anesthesia, he was placed in the dorsal lithotomy position. His arms were draped by his side and was appropriately padded and secured to the operating room table. He was placed in the Trendelenburg position.  He was prepped and draped in sterile fashion. An 79 French Foley was placed in the bladder and instilled with 15 cc sterile water. Orogastric tube was placed. The Veress needle was passed just above the umbilicus and the abdomen was insufflated to 15 atmospheres. An 8 mm, blunt-tip trocar was placed just above the umbilicus. The zero-degree camera was passed within this and the following trocars were placed under direct vision; 8 mm robotic trocars were placed 9 cm laterally and inferiorly to the initially placed umbilical trocar. A third one was placed 7 cm lateral to the left-sided trocar. In the  corresponding position on the right side, a 12 mm trocar was placed, and then a 5 mm trocar was placed to the right and well above the umbilicus.  The 12 mm assistant port site was preclosed using 0 Vicryl suture using a Carter-Thomason which was tied down at the end of the case to close this port site.  The robot was then docked with the robot trocar. I used the zero-degree camera. I had the hot scissors in the right hand and the left hand with the [place] bipolar and far left hand the Prograsp forceps. Initially I divided the median umbilical ligament bilaterally and the urachus and developed the space of Retzius down to pubic bone. I divided the parietal peritoneum laterally up to the vas deferens on each side. I used the prograsp forceps to provide cranial traction on the urachus. There was a fat containing hernia on the left. I cleaned off the Endopelvic fascia on each side and then divided it with the scissors laterally to the perirectal fat and medially to the puboprostatic ligaments which were divided. I then ligated the dorsal vein complex using a 60 mm vascular load stapler.   I then addressed the bladder neck with a 30-degree down lens. I identified the bladder neck by pulling on the Foley catheter. I divided the anterior bladder neck musculature until I then found the anterior bladder neck mucosa which was incised. I identified the Foley catheter within, deflated the balloon, pulled the Foley out through this opening and then using the Carter-Thomason needle with a #0-Vicryl suture, passed through The suprapubic region and pulled the suture through the eye of the Foley and then back out. This allowed me to provide upward traction on the prostate. I then divided the lateral bladder neck mucosa and the posterior bladder neck mucosa.  I was well away from ureteral orifices. I divided the posterior bladder neck musculature until I identified the vas deferens. They were freed  proximally, then divided. I freed up the seminal vesicals using blunt and sharp dissection. Judicious use of electrocautery was used near the seminal vesicle tips to avoid injury to neurovascular bundle.   I then went back to the 0-degree lens. I divided the Denonvilliers fascia beneath the prostate and developed the prostate off the rectum. I then did a bilateral nerve sparing by  creating a plane which was intrafascial. I then isolated the pedicles of the prostate and placed weck clips on the pedicles of the prostate and then divided it with cold scissors. I continued to divide the neurovascular bundles off the prostate out to the apex of the prostate. At this point the prostate was freed up except for the urethra. I addressed the prostate anteriorly, divided the dorsal vein , then the anterior urethral wall, pulled the Foley catheter back and then divided the posterior urethral wall. Specimen was completely freed up. I placed the prostate in an Endo catch bag and then placed the bag in the upper abdomen out of the way. I then irrigated the pelvis. The rectal test was negative. There was reasonable hemostasis.  I then did the pelvic lymph node dissection by incising the fascia overlying the right external iliac vein, dissecting distally. I went just distal to the node of [place] where we placed clips and then divided the lymphatics. The lateral aspect of the dissection was the pelvic side wall, inferior was the obturator nerve and proximal the hypogastric vessels. I placed clips at the proximal aspect and then divided the lymphatics. This was removed with the spoon grasper and sent to pathology.   I then did the left obturator lymph node dissection in the same fashion as the left side.  With good hemostasis, I then did the posterior reconstruction. I used a 3-0 VLoc suture through the cut edge of Denonvilliers fascia beneath the bladder on the right side and through the posterior  striated sphincter underneath the urethra. This brought the bladder neck and urethra and closer proximity to help facilitate anastomosis.   I then did the urethral vesicle anastomosis again with two 3-0 VLoc sutures interlocked. I passed both ends of the suture from the outside-in through the bladder neck at the 6 o'clock position. I passed both through the urethral stump from the inside-out in the corresponding position. I reapproximated the bladder neck to the urethra. I then ran the Left suture on the left side anastomosis to the 9 o'clock position. Then I went back to the right sided suture and ran that up the right side to the 12 o'clock position. I then continued the left suture to the 12 o'clock position. They met in the middle and were tied.   I then placed a new 23 French Foley into the bladder and filled it with 10 cc sterile water. I irrigated the bladder with 160 cc. There was no leakage. There was reasonable hemostasis.  Surgicel was used on either side of the pedicles for an additional hemostasis.  Surgi-Flo was also used. The instruments were then removed. The robot was undocked and all the trocars were removed under direct vision. There was good hemostasis. I then enlarged the umbilical trocar site large enough to remove the prostate and I closed the fascia here with #0-0 Vicryl suture in a running fashion. All the port sites were irrigated. Lidocaine was injected into  all the trocar sites. The skin was closed with 4-0 Monocryl in running subcuticular fashion. Dermabond was applied.   At this point patient was awakened and extubated in the operating room and taken to the recovery room in stable condition. There were no complications. All counts correct.  An assistant was required for this surgical procedure. The duties of the assistant included but were not limited to suctioning, passing suture, camera manipulation, retraction. This procedure would not be able to be  performed without an Environmental consultant.    Hollice Espy, MD

## 2021-01-15 NOTE — Discharge Instructions (Addendum)
Activity:  You are encouraged to ambulate frequently (about every hour during waking hours) to help prevent blood clots from forming in your legs or lungs.  However, you should not engage in any heavy lifting (> 5-10 lbs), strenuous activity, or straining.  Diet: You should advance your diet as instructed by your physician.  It will be normal to have some bloating, nausea, and abdominal discomfort intermittently.  Prescriptions:  You will be provided a prescription for pain medication to take as needed.  If your pain is not severe enough to require the prescription pain medication, you may take extra strength Tylenol instead which will have less side effects.  You should also take a prescribed stool softener to avoid straining with bowel movements as the prescription pain medication may constipate you.  Incisions: You may remove your dressing bandages 48 hours after surgery if not removed in the hospital.  You will either have some small staples or special tissue glue at each of the incision sites. Once the bandages are removed (if present), the incisions may stay open to air.  You may start showering (but not soaking or bathing in water) the 2nd day after surgery and the incisions simply need to be patted dry after the shower.  No additional care is needed.  What to call us about: You should call the office if you develop fever > 101 or develop persistent vomiting, redness or draining around your incision, or any other concerning symptoms.    [facility] [address], [place], [place] [address] [phone]    AMBULATORY SURGERY  DISCHARGE INSTRUCTIONS   The drugs that you were given will stay in your system until tomorrow so for the next 24 hours you should not:  Drive an automobile Make any legal decisions Drink any alcoholic beverage   You may resume regular meals tomorrow.  Today it is better to start with liquids and gradually work up to solid  foods.  You may eat anything you prefer, but it is better to start with liquids, then soup and crackers, and gradually work up to solid foods.   Please notify your doctor immediately if you have any unusual bleeding, trouble breathing, redness and pain at the surgery site, drainage, fever, or pain not relieved by medication.    Additional Instructions:        Please contact your physician with any problems or Same Day Surgery at [phone], Monday through Friday 6 am to 4 pm, or [place] at [facility] number at [phone].

## 2021-01-15 NOTE — Transfer of Care (Signed)
Immediate Anesthesia Transfer of Care Note  Patient: Calvin Sanders  Procedure(s) Performed: XI ROBOTIC ASSISTED LAPAROSCOPIC RADICAL PROSTATECTOMY PELVIC LYMPH NODE DISSECTION (Bilateral)  Patient Location: PACU  Anesthesia Type:General  Level of Consciousness: awake and alert   Airway & Oxygen Therapy: Patient Spontanous Breathing and Patient connected to face mask oxygen  Post-op Assessment: Report given to RN and Post -op Vital signs reviewed and stable  Post vital signs: Reviewed and stable  Last Vitals:  Vitals Value Taken Time  BP 184/77   Temp    Pulse 69 01/15/21 1108  Resp 15 01/15/21 1108  SpO2 100 % 01/15/21 1108  Vitals shown include unvalidated device data.  Last Pain:  Vitals:   01/15/21 0641  TempSrc: Oral  PainSc: 0-No pain         Complications: No notable events documented.

## 2021-01-15 NOTE — Progress Notes (Addendum)
   01/15/21 0730  Clinical Encounter Type  Visited With Patient  Visit Type Initial;Spiritual support;Social support  Referral From Chaplain  Consult/Referral To [place] visited PT at bedside and offered emotional and spiritual support. PT stated he was very pleased with the medical team at [facility]. Chaplain told PT; a chaplain is always available if needed.  No family present at bedside.   Andee Poles, MDiv

## 2021-01-15 NOTE — Anesthesia Preprocedure Evaluation (Signed)
Anesthesia Evaluation  Patient identified by MRN, date of birth, ID band Patient awake    Reviewed: Allergy & Precautions, NPO status , Patient's Chart, lab work & pertinent test results  History of Anesthesia Complications Negative for: history of anesthetic complications  Airway Mallampati: III  TM Distance: >3 FB Neck ROM: Full    Dental no notable dental hx. (+) Teeth Intact   Pulmonary neg pulmonary ROS, neg sleep apnea, neg COPD, Patient abstained from smoking.Not current smoker, former smoker,    Pulmonary exam normal breath sounds clear to auscultation       Cardiovascular Exercise Tolerance: Good METShypertension, Pt. on medications (-) CAD and (-) Past MI (-) dysrhythmias  Rhythm:Regular Rate:Normal - Systolic murmurs    Neuro/Psych PSYCHIATRIC DISORDERS Depression negative neurological ROS     GI/Hepatic GERD  Controlled,(+)     substance abuse  alcohol use, Former heavy drinker, now drinks 1-2 beers per day   Endo/Other  diabetes, Well Controlled, Oral Hypoglycemic Agents  Renal/GU negative Renal ROS     Musculoskeletal   Abdominal   Peds  Hematology   Anesthesia Other Findings Past Medical History: 08/05/2014: Alcohol dependence, daily use ([place])     Comment:  Previously 6-8 beers per day, cut back in 2020 to 2 per               day; in 09/2018 he is only drinking 2 beers on Sat and Sun No date: Diabetes mellitus without complication ([place])     Comment:  type 2 No date: GERD (gastroesophageal reflux disease) No date: Hypertension No date: Pneumonia 10/2020: Prostate cancer [facility])  Reproductive/Obstetrics                             Anesthesia Physical Anesthesia Plan  ASA: 2  Anesthesia Plan: General   Post-op Pain Management: Dilaudid IV and Ofirmev IV (intra-op)   Induction: Intravenous  PONV Risk Score and Plan: 4 or greater and Ondansetron, Dexamethasone,  Midazolam and Treatment may vary due to age or medical condition  Airway Management Planned: Oral ETT  Additional Equipment: None  Intra-op Plan:   Post-operative Plan: Extubation in OR  Informed Consent: I have reviewed the patients History and Physical, chart, labs and discussed the procedure including the risks, benefits and alternatives for the proposed anesthesia with the patient or authorized representative who has indicated his/her understanding and acceptance.     Dental advisory given  Plan Discussed with: CRNA and Surgeon  Anesthesia Plan Comments: (Discussed risks of anesthesia with patient, including PONV, sore throat, lip/dental/eye damage. Rare risks discussed as well, such as cardiorespiratory and neurological sequelae, and allergic reactions. Discussed the role of CRNA in patient's perioperative care. Patient understands.)        Anesthesia Quick Evaluation

## 2021-01-15 NOTE — H&P (Signed)
H&P updated 01/15/21 RRR CTAB  No other changes   Calvin Sanders 1955-06-17 [id number]   Referring provider:  Glean Hess, MD [address] [place],  [place] [address]    Chief Complaint  Patient presents with   Follow-up        HPI: Calvin Sanders is a 65 y.o.male with a personal history of prostate cancer, who presents today for discussion of surgery for prostate cancer.    He has history of abnormal rectal exam with abnormal PSA velocity. His prostate volume is 47g.    Surgical pathology on 10/24/2020 revealed Gleason 4+3 involving 2 cores, and Gleason 3+3 involving 2 cores.    CT abdomen and pelvis on 12/04/2020 revealed no evidence of metastatic disease or acute findings in the abdomen.    He is diabetic and has had recent HGBA1C of 9.1.    His BMI is 21.83 as of  10/2020. He has no previous abdominal surgeries.   He is doing well today and is accompanied by his wife.  Baseline IPSS / SHIM below.  Baseline severe erectile dysfunction on Viagra.       IPSS       Row Name 12/13/20 0900                   International Prostate Symptom Score    How often have you had the sensation of not emptying your bladder? Not at All        How often have you had to urinate less than every two hours? Less than 1 in 5 times        How often have you found you stopped and started again several times when you urinated? Less than 1 in 5 times        How often have you found it difficult to postpone urination? Not at All        How often have you had a weak urinary stream? Not at All        How often have you had to strain to start urination? Not at All        How many times did you typically get up at night to urinate? 1 Time        Total IPSS Score 3               Quality of Life due to urinary symptoms    If you were to spend the rest of your life with your urinary condition just the way it is now how would you feel about that? Pleased                       Score:  1-7 Mild 8-19 Moderate 20-35 Severe     SHIM       Row Name 12/13/20 0954                   SHIM: Over the last 6 months:    How do you rate your confidence that you could get and keep an erection? Moderate        When you had erections with sexual stimulation, how often were your erections hard enough for penetration (entering your partner)? Most Times (much more than half the time)        During sexual intercourse, how often were you able to maintain your erection after you had penetrated (entered) your partner? Most Times (much more than half the time)  During sexual intercourse, how difficult was it to maintain your erection to completion of intercourse? Slightly Difficult        When you attempted sexual intercourse, how often was it satisfactory for you? Most Times (much more than half the time)               SHIM Total Score    SHIM 19                          PMH:     Past Medical History:  Diagnosis Date   Alcohol dependence, daily use ([place]) 08/05/2014    Previously 6-8 beers per day, cut back in 2020 to 2 per day; in 09/2018 he is only drinking 2 beers on Sat and Sun   Diabetes mellitus without complication ([place])     Hypertension        Surgical History:      Past Surgical History:  Procedure Laterality Date   NO PAST SURGERIES       PROSTATE BIOPSY N/A 10/24/2020    Procedure: PROSTATE BIOPSY;  Surgeon: Abbie Sons, MD;  Location: [facility] ORS;  Service: Urology;  Laterality: N/A;   TRANSRECTAL ULTRASOUND N/A 10/24/2020    Procedure: TRANSRECTAL ULTRASOUND;  Surgeon: Abbie Sons, MD;  Location: [facility] ORS;  Service: Urology;  Laterality: N/A;  Confirmed with Kim in Ultrasound      Home Medications:  Allergies as of 12/13/2020   No Known Allergies         Medication List           Accurate as of December 13, 2020 10:13 AM. If you have any questions, ask your nurse or doctor.              acetaminophen 500 MG tablet Commonly  known as: TYLENOL Take 2 tablets (1,000 mg total) by mouth every 8 (eight) hours as needed.    atorvastatin 40 MG tablet Commonly known as: LIPITOR TAKE 1 TABLET BY MOUTH DAILY    B-12 500 MCG Tabs Take 1 tablet by mouth daily.    citalopram 20 MG tablet Commonly known as: CELEXA TAKE 1 TABLET BY MOUTH EVERY DAY    Farxiga 10 MG Tabs tablet Generic drug: dapagliflozin propanediol TAKE 1 TABLET BY MOUTH EVERY DAY    glimepiride 2 MG tablet Commonly known as: AMARYL TAKE 1 TABLET(2 MG) BY MOUTH DAILY WITH BREAKFAST    losartan-hydrochlorothiazide 100-25 MG tablet Commonly known as: HYZAAR TAKE 1 TABLET BY MOUTH EVERY DAY    metFORMIN 500 MG tablet Commonly known as: GLUCOPHAGE TAKE 2 TABLETS BY MOUTH EVERY MORNING AND 1 TABLET IN THE EVENING    sildenafil 100 MG tablet Commonly known as: VIAGRA Take 1 tablet (100 mg total) by mouth daily as needed for erectile dysfunction. Take two hours prior to intercourse on an empty stomach    Vitamin D3 50 MCG (2000 UT) Tabs Take 2,000 Units by mouth daily.             Allergies: No Known Allergies   Family History:      Family History  Problem Relation Age of Onset   Diabetes Mother     Cancer Father          bone   Heart disease Brother     Prostate cancer Brother     Kidney cancer Neg Hx     Kidney disease Neg Hx     Bladder Cancer  Neg Hx        Social History:  reports that he quit smoking about 3 months ago. His smoking use included cigarettes. He started smoking about 47 years ago. He has a 11.25 pack-year smoking history. He has never used smokeless tobacco. He reports current alcohol use of about 14.0 standard drinks per week. He reports that he does not use drugs.     Physical Exam: BP 138/74   Pulse (!) 59   Ht 6\' 2"  (1.88 m)   Wt 170 lb (77.1 kg)   BMI 21.83 kg/m   Constitutional:  Alert and oriented, No acute distress. HEENT: [place] AT, moist mucus membranes.  Trachea midline, no masses. Cardiovascular:  No clubbing, cyanosis, or edema. Respiratory: Normal respiratory effort, no increased work of breathing. Skin: No rashes, bruises or suspicious lesions. Neurologic: Grossly intact, no focal deficits, moving all 4 extremities. Psychiatric: Normal mood and affect.   Laboratory Data:   Recent Labs       Lab Results  Component Value Date    CREATININE 1.10 12/01/2020      Recent Labs       Lab Results  Component Value Date    HGBA1C 9.1 (A) 10/13/2020        Assessment & Plan:     Prostate cancer  Unfavorable intermediate risk prostate cancer, metastatic work-up is negative   The patient was counseled about the natural history of prostate cancer and the standard treatment options that are available for prostate cancer. It was explained to him how his age and life expectancy, clinical stage, Gleason score, and PSA affect his prognosis, the decision to proceed with additional staging studies, as well as how that information influences recommended treatment strategies. We discussed the roles for active surveillance, radiation therapy, surgical therapy, androgen deprivation, as well as ablative therapy options for the treatment of prostate cancer as appropriate to his individual cancer situation. We discussed the risks and benefits of these options with regard to their impact on cancer control and also in terms of potential adverse events, complications, and impact on quality of life particularly related to urinary, bowel, and sexual function. The patient was encouraged to ask questions throughout the discussion today and all questions were answered to his stated satisfaction. In addition, the patient was provided with and/or directed to appropriate resources and literature for further education about prostate cancer treatment options.   We discussed surgical therapy for prostate cancer including the different available surgical approaches.  Specifically, we discussed robotic prostatectomy with  pelvic lymph node dissection based on his restratification.  We discussed, in detail, the risks and expectations of surgery with regard to cancer control, urinary control, and erectile dysfunction as well as expected post operative recovery processed. Additional risks of surgery including but not limited to bleeding, infection, hernia formation, nerve damage, fistula formation, bowel/rectal injury, potentially necessitating colostomy, damage to the urinary tract resulting in urinary leakage, urethral stricture, and cardiopulmonary risk such as myocardial infarction, stroke, death, thromboembolism etc. were explained.    - Discussed pelvic floor exercises to build pelvic floor muscle and the importance of maintaining pelvic floor muscles before and after surgery. Referral sent to physical therapy.      -He understands in the setting of severe erectile dysfunction, he will likely have worse postop erectile function and may require further intervention on the road such as injections or implant.  We will still try for nerve sparing procedure intraoperatively.   2. Diabetes  - Discussed concern  for HGBA1C being high. Stressed why it is important to have glycemic levels controlled before surgery which is behavioral. Note sent to PCP, Dr. Army Melia, to see if there is any optimization of medications to reduce risk of post-op complications.  We also discussed behavior modification and dietary intervention.   Follow-up in 6-8 weeks for robotic prostatectomy      I,Kailey Littlejohn,acting as a scribe for Hollice Espy, MD.,have documented all relevant documentation on the behalf of Hollice Espy, MD,as directed by  Hollice Espy, MD while in the presence of Hollice Espy, MD.   I have reviewed the above documentation for accuracy and completeness, and I agree with the above.    Hollice Espy, MD   I spent 47 total minutes on the day of the encounter including pre-visit review of the medical record,  face-to-face time with the patient, and post visit ordering of labs/imaging/tests.     [facility] [address], [place], [place] [address] [phone]

## 2021-01-16 ENCOUNTER — Other Ambulatory Visit: Payer: Self-pay | Admitting: Internal Medicine

## 2021-01-16 ENCOUNTER — Encounter: Payer: Self-pay | Admitting: Urology

## 2021-01-16 DIAGNOSIS — C61 Malignant neoplasm of prostate: Secondary | ICD-10-CM | POA: Diagnosis not present

## 2021-01-16 DIAGNOSIS — E118 Type 2 diabetes mellitus with unspecified complications: Secondary | ICD-10-CM

## 2021-01-16 DIAGNOSIS — E1169 Type 2 diabetes mellitus with other specified complication: Secondary | ICD-10-CM

## 2021-01-16 DIAGNOSIS — E785 Hyperlipidemia, unspecified: Secondary | ICD-10-CM

## 2021-01-16 LAB — CBC
HCT: 38.8 % — ABNORMAL LOW (ref 39.0–52.0)
Hemoglobin: 13.6 g/dL (ref 13.0–17.0)
MCH: 34.3 pg — ABNORMAL HIGH (ref 26.0–34.0)
MCHC: 35.1 g/dL (ref 30.0–36.0)
MCV: 97.7 fL (ref 80.0–100.0)
Platelets: 200 10*3/uL (ref 150–400)
RBC: 3.97 MIL/uL — ABNORMAL LOW (ref 4.22–5.81)
RDW: 12.4 % (ref 11.5–15.5)
WBC: 9.4 10*3/uL (ref 4.0–10.5)
nRBC: 0 % (ref 0.0–0.2)

## 2021-01-16 LAB — BASIC METABOLIC PANEL
Anion gap: 7 (ref 5–15)
BUN: 10 mg/dL (ref 8–23)
CO2: 26 mmol/L (ref 22–32)
Calcium: 8.5 mg/dL — ABNORMAL LOW (ref 8.9–10.3)
Chloride: 101 mmol/L (ref 98–111)
Creatinine, Ser: 0.75 mg/dL (ref 0.61–1.24)
GFR, Estimated: >60 mL/min (ref >60)
Glucose, Bld: 226 mg/dL — ABNORMAL HIGH (ref 70–99)
Potassium: 4 mmol/L (ref 3.5–5.1)
Sodium: 134 mmol/L — ABNORMAL LOW (ref 135–145)

## 2021-01-16 LAB — GLUCOSE, CAPILLARY
Glucose-Capillary: 236 mg/dL — ABNORMAL HIGH (ref 70–99)
Glucose-Capillary: 262 mg/dL — ABNORMAL HIGH (ref 70–99)

## 2021-01-16 MED ORDER — DOCUSATE SODIUM 100 MG PO CAPS
100.0000 mg | ORAL_CAPSULE | Freq: Two times a day (BID) | ORAL | 0 refills | Status: DC
Start: 2021-01-16 — End: 2021-07-25

## 2021-01-16 MED ORDER — OXYBUTYNIN CHLORIDE 5 MG PO TABS: 5.0000 mg | ORAL_TABLET | Freq: Three times a day (TID) | ORAL | 0 refills | Status: AC | PRN

## 2021-01-16 MED ORDER — CHLORHEXIDINE GLUCONATE CLOTH 2 % EX PADS
6.0000 | MEDICATED_PAD | Freq: Every day | CUTANEOUS | Status: DC
  Administered 2021-01-16: 6 via TOPICAL

## 2021-01-16 MED ORDER — OXYCODONE-ACETAMINOPHEN 5-325 MG PO TABS
1.0000 | ORAL_TABLET | Freq: Four times a day (QID) | ORAL | 0 refills | Status: DC | PRN
Start: 2021-01-16 — End: 2021-07-25

## 2021-01-16 NOTE — Telephone Encounter (Signed)
Atorvastatin refilled 11/13/2020 #90 with 1 refill Glimepiride refilled 12/23/2020 #90 with 0 refill.

## 2021-01-16 NOTE — Discharge Summary (Signed)
Date of admission: 01/15/2021  Date of discharge: 01/16/2021  Admission diagnosis: Prostate cancer  Discharge diagnosis: Same as above  Secondary diagnoses:  Patient Active Problem List   Diagnosis Date Noted   Prostate cancer ([place]) 12/25/2020   MCI (mild cognitive impairment) 03/19/2017   Major depressive disorder, recurrent episode, moderate ([place]) 03/19/2017   B12 deficiency 05/22/2016   Vitamin D deficiency 02/08/2015   Erectile dysfunction of organic origin 09/06/2014   Family history of prostate cancer 09/06/2014   DM (diabetes mellitus), type 2 with complications ([place]) 00/86/7619   Hyperlipidemia associated with type 2 diabetes mellitus ([place]) 08/05/2014   Tobacco use disorder, severe, in early remission 08/05/2014   Colon polyps 05/14/2014   Chronic prostatitis 05/14/2014   Essential hypertension 05/14/2014   BPH (benign prostatic hyperplasia) 05/14/2014   History and Physical: For full details, please see admission history and physical. Briefly, Calvin Sanders is a 65 y.o. year old patient admitted on 01/15/2021 for scheduled robotic assisted laparoscopic radical prostatectomy, bilateral nerve sparing, with bilateral pelvic lymph node dissection with Dr. Erlene Quan for management of prostate cancer.   This morning, patient reports feeling well.  He states his pain is well controlled.  He denies nausea and vomiting.  He has not yet passed flatus.  Physical Exam: Constitutional:  Alert and oriented, no acute distress, nontoxic appearing HEENT: [place], AT Cardiovascular: No clubbing, cyanosis, or edema Respiratory: Normal respiratory effort, no increased work of breathing GI: Surgical incisions noted over the anterior abdomen, all clean, dry, and intact with overlying surgical adhesive.  Abdomen is soft and minimally tender without rebound or guarding. Skin: No rashes, bruises or suspicious lesions Neurologic: Grossly intact, no focal deficits, moving all 4 extremities Psychiatric:  Normal mood and affect   Hospital Course: Patient tolerated the procedure well.  He was then transferred to the floor after an uneventful PACU stay.  His hospital course was uncomplicated.  On POD#1 he had met discharge criteria: was eating a carb modified diet, was up and ambulating independently,  pain was well controlled, catheter was draining well, and was ready for discharge.  Laboratory values:  Recent Labs    01/16/21 0540  WBC 9.4  HGB 13.6  HCT 38.8*   Recent Labs    01/16/21 0540  NA 134*  K 4.0  CL 101  CO2 26  GLUCOSE 226*  BUN 10  CREATININE 0.75  CALCIUM 8.5*   Results for orders placed or performed during the hospital encounter of 01/11/21  SARS CORONAVIRUS 2 (TAT 6-24 HRS) Nasopharyngeal Nasopharyngeal Swab     Status: None   Collection Time: 01/11/21  8:50 AM   Specimen: Nasopharyngeal Swab  Result Value Ref Range Status   SARS Coronavirus 2 NEGATIVE NEGATIVE Final    Comment: (NOTE) SARS-CoV-2 target nucleic acids are NOT DETECTED.  The SARS-CoV-2 RNA is generally detectable in upper and lower respiratory specimens during the acute phase of infection. Negative results do not preclude SARS-CoV-2 infection, do not rule out co-infections with other pathogens, and should not be used as the sole basis for treatment or other patient management decisions. Negative results must be combined with clinical observations, patient history, and epidemiological information. The expected result is Negative.  Fact Sheet for Patients: SugarRoll.be  Fact Sheet for Healthcare Providers: https://www.woods-mathews.com/  This test is not yet approved or cleared by the [place] FDA and  has been authorized for detection and/or diagnosis of SARS-CoV-2 by FDA under an Emergency Use Authorization (EUA). This EUA will remain  in effect (meaning this test can be used) for the duration of the COVID-19 declaration under Se ction  564(b)(1) of the Act, 21 U.S.C. section 360bbb-3(b)(1), unless the authorization is terminated or revoked sooner.  Performed at [facility], [place] [address], [place], [place] [address]    Disposition: Home  Discharge instruction: The patient was instructed to be ambulatory but told to refrain from heavy lifting, strenuous activity, or driving. Catheter care instructions provided by nursing.  Discharge medications:  Allergies as of 01/16/2021       Reactions   Codeine Other (See Comments)   Patient states "a long time ago, codeine made me dizzy."  He does not recollect which medication it was.         Medication List     TAKE these medications    atorvastatin 40 MG tablet Commonly known as: LIPITOR TAKE 1 TABLET BY MOUTH DAILY   B-12 500 MCG Tabs Take 1 tablet by mouth daily.   citalopram 20 MG tablet Commonly known as: CELEXA TAKE 1 TABLET BY MOUTH EVERY DAY   docusate sodium 100 MG capsule Commonly known as: COLACE Take 1 capsule (100 mg total) by mouth 2 (two) times daily.   Farxiga 10 MG Tabs tablet Generic drug: dapagliflozin propanediol TAKE 1 TABLET BY MOUTH EVERY DAY   glimepiride 2 MG tablet Commonly known as: AMARYL TAKE 1 TABLET BY MOUTH EVERY DAY WITH BREAKFAST   losartan-hydrochlorothiazide 100-25 MG tablet Commonly known as: HYZAAR TAKE 1 TABLET BY MOUTH EVERY DAY   metFORMIN 500 MG tablet Commonly known as: GLUCOPHAGE Take 2 tablets (1,000 mg total) by mouth 2 (two) times daily with a meal.   oxybutynin 5 MG tablet Commonly known as: DITROPAN Take 1 tablet (5 mg total) by mouth every 8 (eight) hours as needed for bladder spasms.   oxyCODONE-acetaminophen 5-325 MG tablet Commonly known as: PERCOCET/ROXICET Take 1-2 tablets by mouth every 6 (six) hours as needed for moderate pain.   sildenafil 100 MG tablet Commonly known as: VIAGRA Take 1 tablet (100 mg total) by mouth daily as needed for erectile dysfunction. Take two hours prior  to intercourse on an empty stomach   Vitamin D3 50 MCG (2000 UT) Tabs Take 2,000 Units by mouth daily.        Followup:   Follow-up Information     Hollice Espy, MD Follow up.   Specialty: Urology Why: Office will call you with a follow-up appointment Contact information: [place] [address] [phone]

## 2021-01-16 NOTE — Progress Notes (Signed)
Patient's wife at bedside.  Discharge instructions given.  Education emphasized on signs of infection to watch for and catheter care and maintenance.  Instructions on emptying catheter, measuring output, and documentation of output given to both the patient and his spouse.  Instructed to bring the log of output to doctor's appointment.  Both verbalized understanding.  Patient discharged home.

## 2021-01-17 NOTE — Telephone Encounter (Signed)
Requested Prescriptions  Pending Prescriptions Disp Refills  . atorvastatin (LIPITOR) 40 MG tablet [Pharmacy Med Name: ATORVASTATIN 40 MG TABLET] 90 tablet 1    Sig: TAKE 1 TABLET BY MOUTH EVERY DAY     Cardiovascular:  Antilipid - Statins Failed - 01/16/2021  3:15 PM      Failed - LDL in normal range and within 360 days    LDL Chol Calc (NIH)  Date Value Ref Range Status  06/21/2020 108 (H) 0 - 99 mg/dL Final         Passed - Total Cholesterol in normal range and within 360 days    Cholesterol, Total  Date Value Ref Range Status  06/21/2020 175 100 - 199 mg/dL Final         Passed - HDL in normal range and within 360 days    HDL  Date Value Ref Range Status  06/21/2020 41 >39 mg/dL Final         Passed - Triglycerides in normal range and within 360 days    Triglycerides  Date Value Ref Range Status  06/21/2020 144 0 - 149 mg/dL Final         Passed - Patient is not pregnant      Passed - Valid encounter within last 12 months    Recent Outpatient Visits          3 weeks ago DM (diabetes mellitus), type 2 with complications [facility])   [facility] Glean Hess, MD   3 months ago DM (diabetes mellitus), type 2 with complications [facility])   [place], Laura H, MD   7 months ago Annual physical exam   [facility] Glean Hess, MD   11 months ago Essential hypertension   [facility] Glean Hess, MD   1 year ago DM (diabetes mellitus), type 2 with complications [facility])   [place], Laura H, MD      Future Appointments            In 6 days Luana Shu [place]   In 3 weeks Army Melia Jesse Sans, MD [facility], [place]   In 5 months Army Melia Jesse Sans, MD [facility], [place]           . glimepiride (AMARYL) 2 MG tablet [Pharmacy Med Name: GLIMEPIRIDE 2 MG TABLET] 90 tablet 0    Sig: TAKE 1 TABLET BY MOUTH [place]      Endocrinology:  Diabetes - Sulfonylureas Failed - 01/16/2021  3:15 PM      Failed - HBA1C is between 0 and 7.9 and within 180 days    Hemoglobin A1C  Date Value Ref Range Status  12/25/2020 9.0 (A) 4.0 - 5.6 % Final   Hgb A1c MFr Bld  Date Value Ref Range Status  06/21/2020 8.8 (H) 4.8 - 5.6 % Final    Comment:             Prediabetes: 5.7 - 6.4          Diabetes: >6.4          Glycemic control for adults with diabetes: <7.0          Passed - Valid encounter within last 6 months    Recent Outpatient Visits          3 weeks ago DM (diabetes mellitus), type 2 with complications [facility])   [facility] Glean Hess, MD  3 months ago DM (diabetes mellitus), type 2 with complications [facility])   [facility] Glean Hess, MD   7 months ago Annual physical exam   [facility] Glean Hess, MD   11 months ago Essential hypertension   [facility] Glean Hess, MD   1 year ago DM (diabetes mellitus), type 2 with complications [facility])   [place], Laura H, MD      Future Appointments            In 6 days Luana Shu [place]   In 3 weeks Army Melia Jesse Sans, MD [facility], [place]   In 5 months Army Melia, Jesse Sans, MD [facility], [facility]

## 2021-01-17 NOTE — Telephone Encounter (Signed)
[facility] and spoke to [place], [facility] about the refill(s) Glimepiride requested. Advised it was sent on 12/23/20 #90/0 refill(s). She says it was received and it's too soon for a refill. This request will be refused due to too soon.  Refilled Atorvastatin to a new pharmacy, last sent to [facility].

## 2021-01-22 LAB — SURGICAL PATHOLOGY

## 2021-01-23 ENCOUNTER — Ambulatory Visit (INDEPENDENT_AMBULATORY_CARE_PROVIDER_SITE_OTHER): Payer: BC Managed Care – PPO | Admitting: Physician Assistant

## 2021-01-23 ENCOUNTER — Other Ambulatory Visit: Payer: Self-pay

## 2021-01-23 ENCOUNTER — Encounter: Payer: BC Managed Care – PPO | Admitting: Physical Therapy

## 2021-01-23 DIAGNOSIS — C61 Malignant neoplasm of prostate: Secondary | ICD-10-CM

## 2021-01-23 NOTE — Progress Notes (Signed)
Catheter Removal  Patient is present today for a catheter removal.  4ml of water was drained from the balloon. A 18FR foley cath was removed from the bladder no complications were noted . Patient tolerated well.  Performed by: Debroah Loop, PA-C    Additional notes: We discussed surgical pathology findings of 4+3 prostate cancer, no nodal metastasis, and positive left posterior margin. We discussed the need for follow-up with Dr. Erlene Quan with PSA prior in 4-6 weeks. Patient and wife expressed understanding.  Follow up: Return in about 5 weeks (around 02/27/2021) for Postop follow-up with Dr. Erlene Quan with PSA prior [facility]).

## 2021-01-29 ENCOUNTER — Encounter: Payer: BC Managed Care – PPO | Admitting: Physical Therapy

## 2021-02-13 ENCOUNTER — Ambulatory Visit: Payer: Medicare Other | Admitting: Internal Medicine

## 2021-02-13 NOTE — Progress Notes (Deleted)
Date:  02/13/2021   Name:  Calvin Sanders   DOB:  [birth year]   MRN:  [id number]   Chief Complaint: No chief complaint on file.  HPI  Lab Results  Component Value Date   NA 134 (L) 01/16/2021   K 4.0 01/16/2021   CO2 26 01/16/2021   GLUCOSE 226 (H) 01/16/2021   BUN 10 01/16/2021   CREATININE 0.75 01/16/2021   CALCIUM 8.5 (L) 01/16/2021   EGFR 78 06/21/2020   GFRNONAA >60 01/16/2021   Lab Results  Component Value Date   CHOL 175 06/21/2020   HDL 41 06/21/2020   LDLCALC 108 (H) 06/21/2020   TRIG 144 06/21/2020   CHOLHDL 4.3 06/21/2020   Lab Results  Component Value Date   TSH 1.560 06/21/2020   Lab Results  Component Value Date   HGBA1C 9.0 (A) 12/25/2020   Lab Results  Component Value Date   WBC 9.4 01/16/2021   HGB 13.6 01/16/2021   HCT 38.8 (L) 01/16/2021   MCV 97.7 01/16/2021   PLT 200 01/16/2021   Lab Results  Component Value Date   ALT 30 06/21/2020   AST 21 06/21/2020   ALKPHOS 94 06/21/2020   BILITOT 0.9 06/21/2020   Lab Results  Component Value Date   VD25OH 67.0 06/16/2019     Review of Systems  Patient Active Problem List   Diagnosis Date Noted   Prostate cancer ([place]) 12/25/2020   MCI (mild cognitive impairment) 03/19/2017   Major depressive disorder, recurrent episode, moderate (HCC) 03/19/2017   B12 deficiency 05/22/2016   Vitamin D deficiency 02/08/2015   Erectile dysfunction of organic origin 09/06/2014   Family history of prostate cancer 09/06/2014   DM (diabetes mellitus), type 2 with complications ([place]) 14/70/9295   Hyperlipidemia associated with type 2 diabetes mellitus ([place]) 08/05/2014   Tobacco use disorder, severe, in early remission 08/05/2014   Colon polyps 05/14/2014   Chronic prostatitis 05/14/2014   Essential hypertension 05/14/2014   BPH (benign prostatic hyperplasia) 05/14/2014    Allergies  Allergen Reactions   Codeine Other (See Comments)    Patient states "a long time ago, codeine made me dizzy."  He does  not recollect which medication it was.     Past Surgical History:  Procedure Laterality Date   PELVIC LYMPH NODE DISSECTION Bilateral 01/15/2021   Procedure: PELVIC LYMPH NODE DISSECTION;  Surgeon: Hollice Espy, MD;  Location: [facility] ORS;  Service: Urology;  Laterality: Bilateral;   PROSTATE BIOPSY N/A 10/24/2020   Procedure: PROSTATE BIOPSY;  Surgeon: Abbie Sons, MD;  Location: [facility] ORS;  Service: Urology;  Laterality: N/A;   ROBOT ASSISTED LAPAROSCOPIC RADICAL PROSTATECTOMY N/A 01/15/2021   Procedure: XI ROBOTIC ASSISTED LAPAROSCOPIC RADICAL PROSTATECTOMY;  Surgeon: Hollice Espy, MD;  Location: [facility] ORS;  Service: Urology;  Laterality: N/A;   TRANSRECTAL ULTRASOUND N/A 10/24/2020   Procedure: TRANSRECTAL ULTRASOUND;  Surgeon: Abbie Sons, MD;  Location: [facility] ORS;  Service: Urology;  Laterality: N/A;  Confirmed with Kim in Ultrasound    Social History   Tobacco Use   Smoking status: Former    Packs/day: 0.25    Years: 45.00    Pack years: 11.25    Types: Cigarettes    Start date: 02/11/1973    Quit date: 09/11/2020    Years since quitting: 0.4   Smokeless tobacco: Never   Tobacco comments:    3 a day   Vaping Use   Vaping Use: Never used  Substance Use Topics   Alcohol  use: Yes    Alcohol/week: 14.0 standard drinks    Types: 14 Cans of beer per week    Comment: back up to 1-2 beers per day   Drug use: No     Medication list has been reviewed and updated.  No outpatient medications have been marked as taking for the 02/13/21 encounter (Appointment) with Glean Hess, MD.    [facility] 2/9 Scores 12/25/2020 11/20/2020 10/13/2020 06/21/2020  PHQ - 2 Score 0 0 0 0  PHQ- 9 Score 1 - 0 0    GAD 7 : Generalized Anxiety Score 12/25/2020 10/13/2020 06/21/2020 02/14/2020  Nervous, Anxious, on Edge 0 0 0 0  Control/stop worrying 0 0 0 0  Worry too much - different things 0 0 0 0  Trouble relaxing 0 0 0 0  Restless 0 0 0 0  Easily annoyed or irritable 0 0 0 0  Afraid -  awful might happen 0 0 0 0  Total GAD 7 Score 0 0 0 0  Anxiety Difficulty Not difficult at all - Not difficult at all -    BP Readings from Last 3 Encounters:  01/16/21 (!) 152/69  12/25/20 124/62  12/13/20 138/74    Physical Exam  Wt Readings from Last 3 Encounters:  01/15/21 179 lb 14.3 oz (81.6 kg)  01/08/21 180 lb (81.6 kg)  12/25/20 178 lb 6.4 oz (80.9 kg)    There were no vitals taken for this visit.  Assessment and Plan:

## 2021-02-20 NOTE — Progress Notes (Signed)
02/23/21 10:10 AM   Calvin Sanders [birth year] [id number]  Referring provider:  Glean Hess, MD [address] [place],  [place] [address] Chief Complaint  Patient presents with   Follow-up    5 week Post-op     HPI: Calvin Sanders is a 66 y.o.male with a personal history of prostate cancer, who presents today for 5 weeks post-op with PSA prior.   He has history of abnormal rectal exam with abnormal PSA velocity. His prostate volume is 47g.  He was diagnosed with unfavorable intermediate risk prostate cancer.   CT abdomen and pelvis on 12/04/2020 revealed no evidence of metastatic disease or acute findings in the abdomen.    He is s/p DaVinci laparoscopic radical prostatectomy and bilateral pelvic lymph node dissection on 01/15/2021. Surgical pathology showed prostate, radical prostatectomy acinar adenocarcinoma.   Surgical pathology consistent with Gleason 4+3 prostate cancer, positive extracapsular extension (focal), positive margin, negative lymph nodes, negative seminal vesicles  His most recent PSA on 02/21/21 0.02.  He does have stress incontinence postoperatively.  This seems to be improving.  He is down to 3 pads per day.  He is a little bit wet at night.  Is not really been doing his physical therapy exercises as taught preoperatively.  He does have some baseline erectile dysfunction.  He is not sexually active as a wife.  He is interested in sildenafil therapy.  PMH: Past Medical History:  Diagnosis Date   Alcohol dependence, daily use ([place]) 08/05/2014   Previously 6-8 beers per day, cut back in 2020 to 2 per day; in 09/2018 he is only drinking 2 beers on Sat and Sun   Diabetes mellitus without complication ([place])    type 2   GERD (gastroesophageal reflux disease)    Hypertension    Pneumonia    Prostate cancer ([place]) 10/2020    Surgical History: Past Surgical History:  Procedure Laterality Date   PELVIC LYMPH NODE DISSECTION Bilateral 01/15/2021    Procedure: PELVIC LYMPH NODE DISSECTION;  Surgeon: Hollice Espy, MD;  Location: [facility] OR[facility];  Service: Urology;  Laterality: Bilateral;   PROSTATE BIOPSY N/A 10/24/2020   Procedure: PROSTATE BIOPSY;  Surgeon: Abbie Sons, MD;  Location: [facility] OR[facility];  Service: Urology;  Laterality: N/A;   ROBOT ASSISTED LAPAROSCOPIC RADICAL PROSTATECTOMY N/A 01/15/2021   Procedure: XI ROBOTIC ASSISTED LAPAROSCOPIC RADICAL PROSTATECTOMY;  Surgeon: Hollice Espy, MD;  Location: [facility] OR[facility];  Service: Urology;  Laterality: N/A;   TRANSRECTAL ULTRASOUND N/A 10/24/2020   Procedure: TRANSRECTAL ULTRASOUND;  Surgeon: Abbie Sons, MD;  Location: [facility];  Service: Urology;  Laterality: N/A;  Confirmed with Kim in Ultrasound    Home Medications:  Allergies as of 02/23/2021       Reactions   Codeine Other (See Comments)   Patient states "a long time ago, codeine made me dizzy."  He does not recollect which medication it was.         Medication List        Accurate as of February 23, 2021 10:10 AM. If you have any questions, ask your nurse or doctor.          STOP taking these medications    sildenafil 100 MG tablet Commonly known as: VIAGRA Stopped by: Hollice Espy, MD       TAKE these medications    atorvastatin 40 MG tablet Commonly known as: LIPITOR TAKE 1 TABLET BY MOUTH EVERY DAY   B-12 500 MCG Tabs Take 1 tablet by mouth  daily.   citalopram 20 MG tablet Commonly known as: CELEXA TAKE 1 TABLET BY MOUTH EVERY DAY   docusate sodium 100 MG capsule Commonly known as: COLACE Take 1 capsule (100 mg total) by mouth 2 (two) times daily.   Farxiga 10 MG Tabs tablet Generic drug: dapagliflozin propanediol TAKE 1 TABLET BY MOUTH EVERY DAY   glimepiride 2 MG tablet Commonly known as: AMARYL TAKE 1 TABLET BY MOUTH EVERY DAY WITH BREAKFAST   losartan-hydrochlorothiazide 100-25 MG tablet Commonly known as: HYZAAR TAKE 1 TABLET BY MOUTH EVERY DAY   metFORMIN 500 MG  tablet Commonly known as: GLUCOPHAGE Take 2 tablets (1,000 mg total) by mouth 2 (two) times daily with a meal.   oxybutynin 5 MG tablet Commonly known as: DITROPAN Take 1 tablet (5 mg total) by mouth every 8 (eight) hours as needed for bladder spasms.   oxyCODONE-acetaminophen 5-325 MG tablet Commonly known as: PERCOCET/ROXICET Take 1-2 tablets by mouth every 6 (six) hours as needed for moderate pain.   sildenafil 20 MG tablet Commonly known as: REVATIO 2-5 tablets as needed one hour prior to intercourse. Started by: Hollice Espy, MD   Vitamin D3 50 MCG (2000 UT) Tabs Take 2,000 Units by mouth daily.        Allergies:  Allergies  Allergen Reactions   Codeine Other (See Comments)    Patient states "a long time ago, codeine made me dizzy."  He does not recollect which medication it was.     Family History: Family History  Problem Relation Age of Onset   Diabetes Mother    Cancer Father        bone   Heart disease Brother    Prostate cancer Brother    Kidney cancer Neg Hx    Kidney disease Neg Hx    Bladder Cancer Neg Hx     Social History:  reports that he quit smoking about 5 months ago. His smoking use included cigarettes. He started smoking about 48 years ago. He has a 11.25 pack-year smoking history. He has never used smokeless tobacco. He reports current alcohol use of about 14.0 standard drinks per week. He reports that he does not use drugs.   Physical Exam: BP (!) 144/74    Pulse (!) 57    Ht 5\' 8"  (1.727 m)    Wt 174 lb (78.9 kg)    BMI 26.46 kg/m   Constitutional:  Alert and oriented, No acute distress. HEENT: [place] AT, moist mucus membranes.  Trachea midline, no masses. Cardiovascular: No clubbing, cyanosis, or edema. Respiratory: Normal respiratory effort, no increased work of breathing Abdomen: Abdominal incisions are healing well. Skin: No rashes, bruises or suspicious lesions. Neurologic: Grossly intact, no focal deficits, moving all 4  extremities. Psychiatric: Normal mood and affect.  Laboratory Data:  Lab Results  Component Value Date   CREATININE 0.75 01/16/2021    Assessment & Plan:    1. Prostate cancer ([place]) No evidence of disease, PSA is extremely low  Plan to recheck it again in 3 months in the setting of extracapsular extension/positive margin  We reviewed surgical pathology today  He understands that if his PSA should rise to detectable levels of 0.2, would consider salvage radiation.  He is agreeable this plan. - PSA; Future - PSA; Future  2. Stress incontinence (male) (male) Discussed the importance of pelvic floor exercises, refer back to physical therapy as needed  Anticipate continued improvement - PSA; Future - PSA; Future  3. Erectile dysfunction of organic  origin Discussed penile rehab including the use of sildenafil postoperatively, he is interested in this and a prescription was [facility] to [facility]   PSA only in 3 months, MD visit in 6 months with PSA prior  Conley Rolls as a scribe for Hollice Espy, MD.,have documented all relevant documentation on the behalf of Hollice Espy, MD,as directed by  Hollice Espy, MD while in the presence of Hollice Espy, MD.'  I have reviewed the above documentation for accuracy and completeness, and I agree with the above.   Hollice Espy, MD   [facility] [address], [place], [place] [address] [phone]

## 2021-02-21 ENCOUNTER — Other Ambulatory Visit
Admission: RE | Admit: 2021-02-21 | Discharge: 2021-02-21 | Disposition: A | Discharge status: Home / Self Care | Payer: Medicare Other | Attending: Urology | Admitting: Urology

## 2021-02-21 DIAGNOSIS — C61 Malignant neoplasm of prostate: Secondary | ICD-10-CM | POA: Insufficient documentation

## 2021-02-21 LAB — PSA: Prostatic Specific Antigen: 0.02 ng/mL (ref 0.00–4.00)

## 2021-02-23 ENCOUNTER — Encounter: Payer: Self-pay | Admitting: Urology

## 2021-02-23 ENCOUNTER — Ambulatory Visit (INDEPENDENT_AMBULATORY_CARE_PROVIDER_SITE_OTHER): Payer: Medicare Other | Admitting: Urology

## 2021-02-23 ENCOUNTER — Other Ambulatory Visit: Payer: Self-pay

## 2021-02-23 VITALS — BP 144/74 | HR 57 | Ht 68.0 in | Wt 174.0 lb

## 2021-02-23 DIAGNOSIS — N393 Stress incontinence (female) (male): Secondary | ICD-10-CM

## 2021-02-23 DIAGNOSIS — C61 Malignant neoplasm of prostate: Secondary | ICD-10-CM

## 2021-02-23 DIAGNOSIS — N529 Male erectile dysfunction, unspecified: Secondary | ICD-10-CM

## 2021-02-23 MED ORDER — SILDENAFIL CITRATE 20 MG PO TABS: ORAL_TABLET | ORAL | 3 refills | Status: AC

## 2021-02-23 NOTE — Patient Instructions (Signed)
3 months PSA only. 6 months with PSA

## 2021-03-01 ENCOUNTER — Other Ambulatory Visit: Payer: Self-pay | Admitting: Internal Medicine

## 2021-03-01 DIAGNOSIS — E118 Type 2 diabetes mellitus with unspecified complications: Secondary | ICD-10-CM

## 2021-03-01 NOTE — Telephone Encounter (Signed)
Rx 12/23/20 #90- too soon Requested Prescriptions  Pending Prescriptions Disp Refills   metFORMIN (GLUCOPHAGE) 500 MG tablet [Pharmacy Med Name: METFORMIN HCL 500 MG TABLET] 270 tablet 1    Sig: TAKE 2 TABLETS BY MOUTH EVERY MORNING AND 1 TABLET IN THE EVENING     Endocrinology:  Diabetes - Biguanides Failed - 03/01/2021 11:51 AM      Failed - HBA1C is between 0 and 7.9 and within 180 days    Hemoglobin A1C  Date Value Ref Range Status  12/25/2020 9.0 (A) 4.0 - 5.6 % Final   Hgb A1c MFr Bld  Date Value Ref Range Status  06/21/2020 8.8 (H) 4.8 - 5.6 % Final    Comment:             Prediabetes: 5.7 - 6.4          Diabetes: >6.4          Glycemic control for adults with diabetes: <7.0          Passed - Cr in normal range and within 360 days    Creatinine, Ser  Date Value Ref Range Status  01/16/2021 0.75 0.61 - 1.24 mg/dL Final         Passed - eGFR in normal range and within 360 days    GFR calc Af Amer  Date Value Ref Range Status  06/16/2019 72 >59 mL/min/1.73 Final    Comment:    **[facility] currently reports eGFR in compliance with the current**   recommendations of the [employer]. [facility] will   update reporting as new guidelines are published from the NKF-ASN   Task force.    GFR, Estimated  Date Value Ref Range Status  01/16/2021 >60 >60 mL/min Final    Comment:    (NOTE) Calculated using the CKD-EPI Creatinine Equation (2021)    eGFR  Date Value Ref Range Status  06/21/2020 78 >59 mL/min/1.73 Final         Passed - Valid encounter within last 6 months    Recent Outpatient Visits          2 months ago DM (diabetes mellitus), type 2 with complications [facility])   [facility] Glean Hess, MD   4 months ago DM (diabetes mellitus), type 2 with complications [facility])   [facility] Glean Hess, MD   8 months ago Annual physical exam   [facility] Glean Hess, MD   1 year ago Essential hypertension    [facility] Glean Hess, MD   1 year ago DM (diabetes mellitus), type 2 with complications [facility])   [place], Laura H, MD      Future Appointments            In 1 month Army Melia Jesse Sans, MD [facility], [place]   In 4 months Army Melia Jesse Sans, MD [facility], [place]   In 6 months Hollice Espy, MD [facility] [place]            glimepiride (AMARYL) 2 MG tablet [Pharmacy Med Name: GLIMEPIRIDE 2 MG TABLET] 90 tablet 0    Sig: TAKE 1 TABLET BY MOUTH EVERY DAY WITH BREAKFAST     Endocrinology:  Diabetes - Sulfonylureas Failed - 03/01/2021 11:51 AM      Failed - HBA1C is between 0 and 7.9 and within 180 days    Hemoglobin A1C  Date Value Ref Range Status  12/25/2020 9.0 (A) 4.0 -  5.6 % Final   Hgb A1c MFr Bld  Date Value Ref Range Status  06/21/2020 8.8 (H) 4.8 - 5.6 % Final    Comment:             Prediabetes: 5.7 - 6.4          Diabetes: >6.4          Glycemic control for adults with diabetes: <7.0          Passed - Valid encounter within last 6 months    Recent Outpatient Visits          2 months ago DM (diabetes mellitus), type 2 with complications [facility])   [facility] Glean Hess, MD   4 months ago DM (diabetes mellitus), type 2 with complications [facility])   [facility] Glean Hess, MD   8 months ago Annual physical exam   [facility] Glean Hess, MD   1 year ago Essential hypertension   [facility] Glean Hess, MD   1 year ago DM (diabetes mellitus), type 2 with complications [facility])   [place], Laura H, MD      Future Appointments            In 1 month Army Melia Jesse Sans, MD [facility], [place]   In 4 months Army Melia Jesse Sans, MD [facility], [place]   In 6 months Hollice Espy, [place]

## 2021-03-01 NOTE — Telephone Encounter (Signed)
Called pt left VM to call back. Pts metformin was sent in 12/2020 with refills. [facility] If pt want to switch pharmacies pt can call the pharmacy to get his medications transferred. If he has any problems transferring his medication call back and let us know.  PEC nurse may give results to patient if they return call to clinic, a CRM has been created.  KP

## 2021-03-01 NOTE — Telephone Encounter (Signed)
[facility] called [facility] where the Metformin was sent on 12/23/20. He says he never picked it up. The requested Metformin is not the correct instructions per last OV notes below. [facility] Mitzi Hansen, [facility] about the requested refills. Advised the correct dosage of Metformin was sent to [facility] in [place] on 12/25/20 and asked if he could retrieve it. He says he will call them and get that Metformin, advised the Glimepiride was refused due to it's too early, he verbalized understanding. I will refuse this [facility].   Last OV 12/25/20 notes: Assessment and Plan: 1. DM (diabetes mellitus), type 2 with complications ([place]) BS only slightly improved; will increase metformin to 1000 mg bid

## 2021-03-01 NOTE — Telephone Encounter (Signed)
Requested medication (s) are due for refill today - no  Requested medication (s) are on the active medication list -yes  Future visit scheduled -yes  Last refill: 12/25/20 #120 5RF  Notes to clinic: Request RF: dosing on requested RF is different than dosing listed in last RF/OV note- sent for reveiw  Requested Prescriptions  Pending Prescriptions Disp Refills   metFORMIN (GLUCOPHAGE) 500 MG tablet [Pharmacy Med Name: METFORMIN HCL 500 MG TABLET] 270 tablet 1    Sig: TAKE 2 TABLETS BY MOUTH EVERY MORNING AND 1 TABLET IN [place]     Endocrinology:  Diabetes - Biguanides Failed - 03/01/2021 11:51 AM      Failed - HBA1C is between 0 and 7.9 and within 180 days    Hemoglobin A1C  Date Value Ref Range Status  12/25/2020 9.0 (A) 4.0 - 5.6 % Final   Hgb A1c MFr Bld  Date Value Ref Range Status  06/21/2020 8.8 (H) 4.8 - 5.6 % Final    Comment:             Prediabetes: 5.7 - 6.4          Diabetes: >6.4          Glycemic control for adults with diabetes: <7.0           Passed - Cr in normal range and within 360 days    Creatinine, Ser  Date Value Ref Range Status  01/16/2021 0.75 0.61 - 1.24 mg/dL Final          Passed - eGFR in normal range and within 360 days    GFR calc Af Amer  Date Value Ref Range Status  06/16/2019 72 >59 mL/min/1.73 Final    Comment:    **[facility] currently reports eGFR in compliance with the current**   recommendations of the [employer]. [facility] will   update reporting as new guidelines are published from the NKF-ASN   Task force.    GFR, Estimated  Date Value Ref Range Status  01/16/2021 >60 >60 mL/min Final    Comment:    (NOTE) Calculated using the CKD-EPI Creatinine Equation (2021)    eGFR  Date Value Ref Range Status  06/21/2020 78 >59 mL/min/1.73 Final          Passed - Valid encounter within last 6 months    Recent Outpatient Visits           2 months ago DM (diabetes mellitus), type 2 with complications  [facility])   [facility], [facility]   4 months ago DM (diabetes mellitus), type 2 with complications [facility])   [facility], [facility]   8 months ago Annual physical exam   [facility] Glean Hess, MD   1 year ago Essential hypertension   [facility] Glean Hess, MD   1 year ago DM (diabetes mellitus), type 2 with complications [facility])   [place], Laura H, MD       Future Appointments             In 1 month Army Melia Jesse Sans, MD [facility], [place]   In 4 months Army Melia Jesse Sans, MD [facility], [place]   In 6 months Hollice Espy, MD [facility] [place]            Refused Prescriptions Disp Refills   glimepiride (AMARYL) 2 MG tablet [Pharmacy Med Name: GLIMEPIRIDE 2 MG TABLET] 90  tablet 0    Sig: TAKE 1 TABLET BY MOUTH EVERY DAY WITH BREAKFAST     Endocrinology:  Diabetes - Sulfonylureas Failed - 03/01/2021 11:51 AM      Failed - HBA1C is between 0 and 7.9 and within 180 days    Hemoglobin A1C  Date Value Ref Range Status  12/25/2020 9.0 (A) 4.0 - 5.6 % Final   Hgb A1c MFr Bld  Date Value Ref Range Status  06/21/2020 8.8 (H) 4.8 - 5.6 % Final    Comment:             Prediabetes: 5.7 - 6.4          Diabetes: >6.4          Glycemic control for adults with diabetes: <7.0           Passed - Valid encounter within last 6 months    Recent Outpatient Visits           2 months ago DM (diabetes mellitus), type 2 with complications [facility])   [facility], [facility]   4 months ago DM (diabetes mellitus), type 2 with complications [facility])   [facility], [facility]   8 months ago Annual physical exam   [facility] Glean Hess, MD   1 year ago Essential hypertension   [facility] Glean Hess, MD   1 year ago DM (diabetes mellitus), type 2 with complications [facility])   [place], Laura H, MD       Future Appointments             In 1 month Army Melia Jesse Sans, MD [facility], [place]   In 4 months Glean Hess, MD [facility], [place]   In 6 months Hollice Espy, MD [place]               Requested Prescriptions  Pending Prescriptions Disp Refills   metFORMIN (GLUCOPHAGE) 500 MG tablet [Pharmacy Med Name: METFORMIN HCL 500 MG TABLET] 270 tablet 1    Sig: TAKE 2 TABLETS BY MOUTH EVERY MORNING AND 1 TABLET IN [place]     Endocrinology:  Diabetes - Biguanides Failed - 03/01/2021 11:51 AM      Failed - HBA1C is between 0 and 7.9 and within 180 days    Hemoglobin A1C  Date Value Ref Range Status  12/25/2020 9.0 (A) 4.0 - 5.6 % Final   Hgb A1c MFr Bld  Date Value Ref Range Status  06/21/2020 8.8 (H) 4.8 - 5.6 % Final    Comment:             Prediabetes: 5.7 - 6.4          Diabetes: >6.4          Glycemic control for adults with diabetes: <7.0           Passed - Cr in normal range and within 360 days    Creatinine, Ser  Date Value Ref Range Status  01/16/2021 0.75 0.61 - 1.24 mg/dL Final          Passed - eGFR in normal range and within 360 days    GFR calc Af Amer  Date Value Ref Range Status  06/16/2019 72 >59 mL/min/1.73 Final    Comment:    **[facility] currently reports eGFR in compliance with the current**   recommendations of the [employer]. [facility] will  update reporting as new guidelines are published from the NKF-ASN   Task force.    GFR, Estimated  Date Value Ref Range Status  01/16/2021 >60 >60 mL/min Final    Comment:    (NOTE) Calculated using the CKD-EPI Creatinine Equation (2021)    eGFR  Date Value Ref Range Status  06/21/2020 78 >59 mL/min/1.73 Final          Passed - Valid encounter within last 6 months    Recent Outpatient Visits           2 months ago DM (diabetes mellitus), type 2 with complications [facility])   [facility]  Glean Hess, MD   4 months ago DM (diabetes mellitus), type 2 with complications [facility])   [facility] Glean [facility], MD   8 months ago Annual physical exam   [facility] Glean Hess, MD   1 year ago Essential hypertension   [facility] Glean Hess, MD   1 year ago DM (diabetes mellitus), type 2 with complications [facility])   [place], Laura H, MD       Future Appointments             In 1 month Army Melia Jesse Sans, MD [facility], [place]   In 4 months Glean Hess, MD [facility], [place]   In 6 months Hollice Espy, [place]            Refused Prescriptions Disp Refills   glimepiride (AMARYL) 2 MG tablet [Pharmacy Med Name: GLIMEPIRIDE 2 MG TABLET] 90 tablet 0    Sig: TAKE 1 TABLET BY MOUTH EVERY DAY WITH BREAKFAST     Endocrinology:  Diabetes - Sulfonylureas Failed - 03/01/2021 11:51 AM      Failed - HBA1C is between 0 and 7.9 and within 180 days    Hemoglobin A1C  Date Value Ref Range Status  12/25/2020 9.0 (A) 4.0 - 5.6 % Final   Hgb A1c MFr Bld  Date Value Ref Range Status  06/21/2020 8.8 (H) 4.8 - 5.6 % Final    Comment:             Prediabetes: 5.7 - 6.4          Diabetes: >6.4          Glycemic control for adults with diabetes: <7.0           Passed - Valid encounter within last 6 months    Recent Outpatient Visits           2 months ago DM (diabetes mellitus), type 2 with complications [facility])   [facility], [facility]   4 months ago DM (diabetes mellitus), type 2 with complications [facility])   [place], Laura H, MD   8 months ago Annual physical exam   [facility] Glean Hess, MD   1 year ago Essential hypertension   [facility], MD   1 year ago DM (diabetes mellitus), type 2 with complications [facility])   [place], Laura H, MD        Future Appointments             In 1 month Army Melia Jesse Sans, MD [facility], [place]   In 4 months Army Melia Jesse Sans, MD [facility], [place]   In 6 months Hollice Espy, MD [place]  [place]

## 2021-03-02 ENCOUNTER — Telehealth: Payer: Self-pay

## 2021-03-02 NOTE — Telephone Encounter (Signed)
Copied from [place] [id number]. Topic: General - Other >> Mar 02, 2021  2:40 PM Yvette Rack wrote: Reason for CRM: Pt spouse stated pt will need new Rx for a generic of losartan-hydrochlorothiazide (HYZAAR) 100-25 MG tablet as well as a new Rx for FARXIGA 10 MG TABS tablet because their insurance changed and the medications are not covered

## 2021-03-05 ENCOUNTER — Telehealth: Payer: Self-pay

## 2021-03-05 NOTE — Telephone Encounter (Signed)
Called and left patient a VM asking him to call his insurance and find out what alternative his insurance covers since his wife is stating losartan- HCTZ is not covered.   Waiting for return call for more information on why insurance will not cover this and what insurance will cover. Pt will need to call insurance company to tell us what IS covered.

## 2021-03-05 NOTE — Telephone Encounter (Signed)
Please review.  KP

## 2021-03-07 NOTE — Telephone Encounter (Signed)
Noted  KP 

## 2021-03-14 ENCOUNTER — Other Ambulatory Visit: Payer: Self-pay | Admitting: Internal Medicine

## 2021-03-14 DIAGNOSIS — E118 Type 2 diabetes mellitus with unspecified complications: Secondary | ICD-10-CM

## 2021-03-14 DIAGNOSIS — I10 Essential (primary) hypertension: Secondary | ICD-10-CM

## 2021-03-14 DIAGNOSIS — F331 Major depressive disorder, recurrent, moderate: Secondary | ICD-10-CM

## 2021-03-15 NOTE — Telephone Encounter (Signed)
Requesting early. Requested Prescriptions  Pending Prescriptions Disp Refills   losartan-hydrochlorothiazide (HYZAAR) 100-25 MG tablet [Pharmacy Med Name: LOSARTAN-HCTZ 100-25 MG TAB] 90 tablet 1    Sig: TAKE 1 TABLET BY MOUTH EVERY DAY     Cardiovascular: ARB + Diuretic Combos Failed - 03/14/2021 12:26 PM      Failed - Na in normal range and within 180 days    Sodium  Date Value Ref Range Status  01/16/2021 134 (L) 135 - 145 mmol/L Final  06/21/2020 140 134 - 144 mmol/L Final         Failed - Last BP in normal range    BP Readings from Last 1 Encounters:  02/23/21 (!) 144/74         Passed - K in normal range and within 180 days    Potassium  Date Value Ref Range Status  01/16/2021 4.0 3.5 - 5.1 mmol/L Final         Passed - Cr in normal range and within 180 days    Creatinine, Ser  Date Value Ref Range Status  01/16/2021 0.75 0.61 - 1.24 mg/dL Final         Passed - eGFR is 10 or above and within 180 days    GFR calc Af Amer  Date Value Ref Range Status  06/16/2019 72 >59 mL/min/1.73 Final    Comment:    **[facility] currently reports eGFR in compliance with the current**   recommendations of the [employer]. [facility] will   update reporting as new guidelines are published from the NKF-ASN   Task force.    GFR, Estimated  Date Value Ref Range Status  01/16/2021 >60 >60 mL/min Final    Comment:    (NOTE) Calculated using the CKD-EPI Creatinine Equation (2021)    eGFR  Date Value Ref Range Status  06/21/2020 78 >59 mL/min/1.73 Final         Passed - Patient is not pregnant      Passed - Valid encounter within last 6 months    Recent Outpatient Visits          2 months ago DM (diabetes mellitus), type 2 with complications [facility])   [facility], MD   5 months ago DM (diabetes mellitus), type 2 with complications [facility])   [facility] Glean [facility], MD   8 months ago Annual physical exam   [facility] Glean Hess, MD   1 year ago Essential hypertension   [facility] Glean [facility], MD   1 year ago DM (diabetes mellitus), type 2 with complications [facility])   [place], Laura H, MD      Future Appointments            In 1 month Army Melia Jesse Sans, MD [facility], [place]   In 3 months Army Melia Jesse Sans, MD [facility], [place]   In 5 months Hollice Espy, MD [facility] [place]            metFORMIN (GLUCOPHAGE) 500 MG tablet [Pharmacy Med Name: METFORMIN HCL 500 MG TABLET] 360 tablet 1    Sig: TAKE 2 TABLETS BY MOUTH TWICE DAILY WITH A MEAL     Endocrinology:  Diabetes - Biguanides Failed - 03/14/2021 12:26 PM      Failed - HBA1C is between 0 and 7.9 and within 180 days    Hemoglobin A1C  Date Value Ref Range Status  12/25/2020 9.0 (A) 4.0 - 5.6 % Final   Hgb A1c MFr Bld  Date Value Ref Range Status  06/21/2020 8.8 (H) 4.8 - 5.6 % Final    Comment:             Prediabetes: 5.7 - 6.4          Diabetes: >6.4          Glycemic control for adults with diabetes: <7.0          Passed - Cr in normal range and within 360 days    Creatinine, Ser  Date Value Ref Range Status  01/16/2021 0.75 0.61 - 1.24 mg/dL Final         Passed - eGFR in normal range and within 360 days    GFR calc Af Amer  Date Value Ref Range Status  06/16/2019 72 >59 mL/min/1.73 Final    Comment:    **[facility] currently reports eGFR in compliance with the current**   recommendations of the [employer]. [facility] will   update reporting as new guidelines are published from the NKF-ASN   Task force.    GFR, Estimated  Date Value Ref Range Status  01/16/2021 >60 >60 mL/min Final    Comment:    (NOTE) Calculated using the CKD-EPI Creatinine Equation (2021)    eGFR  Date Value Ref Range Status  06/21/2020 78 >59 mL/min/1.73 Final         Passed - B12 Level in normal range and within 720 days    Vitamin B-12   Date Value Ref Range Status  06/16/2019 493 232 - 1,245 pg/mL Final         Passed - Valid encounter within last 6 months    Recent Outpatient Visits          2 months ago DM (diabetes mellitus), type 2 with complications [facility])   [facility] Glean [facility], MD   5 months ago DM (diabetes mellitus), type 2 with complications [facility])   [facility] Glean [facility], [facility]   8 months ago Annual physical exam   [facility] Glean Hess, MD   1 year ago Essential hypertension   [facility] Glean Hess, MD   1 year ago DM (diabetes mellitus), type 2 with complications [facility])   [place], Laura H, MD      Future Appointments            In 1 month Army Melia Jesse Sans, MD [facility], [place]   In 3 months Army Melia Jesse Sans, MD [facility], [place]   In 5 months Hollice Espy, MD [facility] [place]           Passed - CBC within normal limits and completed in the last 12 months    WBC  Date Value Ref Range Status  01/16/2021 9.4 4.0 - 10.5 K/uL Final   RBC  Date Value Ref Range Status  01/16/2021 3.97 (L) 4.22 - 5.81 MIL/uL Final   Hemoglobin  Date Value Ref Range Status  01/16/2021 13.6 13.0 - 17.0 g/dL Final  06/21/2020 16.0 13.0 - 17.7 g/dL Final   HCT  Date Value Ref Range Status  01/16/2021 38.8 (L) 39.0 - 52.0 % Final   Hematocrit  Date Value Ref Range Status  06/21/2020 44.3 37.5 - 51.0 % Final   MCHC  Date Value Ref Range Status  01/16/2021 35.1 30.0 - 36.0 g/dL Final  [facility]  Date Value Ref Range Status  01/16/2021 34.3 (H) 26.0 - 34.0 pg Final   MCV  Date Value Ref Range Status  01/16/2021 97.7 80.0 - 100.0 fL Final  06/21/2020 94 79 - 97 fL Final   No results found for: PLTCOUNTKUC, LABPLAT, POCPLA RDW  Date Value Ref Range Status  01/16/2021 12.4 11.5 - 15.5 % Final  06/21/2020 12.0 11.6 - 15.4 % Final          citalopram (CELEXA) 20 MG tablet  [Pharmacy Med Name: CITALOPRAM HBR 20 MG TABLET] 90 tablet 1    Sig: TAKE 1 TABLET BY MOUTH EVERY DAY     Psychiatry:  Antidepressants - SSRI Passed - 03/14/2021 12:26 PM      Passed - Completed PHQ-2 or PHQ-9 in the last 360 days      Passed - Valid encounter within last 6 months    Recent Outpatient Visits          2 months ago DM (diabetes mellitus), type 2 with complications [facility])   [facility]   5 months ago DM (diabetes mellitus), type 2 with complications [facility])   [facility], [facility]   8 months ago Annual physical exam   [facility] Glean Hess, MD   1 year ago Essential hypertension   [facility] Glean [facility], [facility]   1 year ago DM (diabetes mellitus), type 2 with complications [facility])   [facility] Glean Hess, MD      Future Appointments            In 1 month Army Melia Jesse Sans, MD [facility], [place]   In 3 months Glean Hess, MD [facility], [place]   In 5 months Hollice Espy, MD [facility] [place]           Signed Prescriptions Disp Refills   glimepiride (AMARYL) 2 MG tablet 90 tablet 0    Sig: TAKE 1 TABLET BY MOUTH EVERY DAY WITH BREAKFAST     Endocrinology:  Diabetes - Sulfonylureas Failed - 03/14/2021 12:26 PM      Failed - HBA1C is between 0 and 7.9 and within 180 days    Hemoglobin A1C  Date Value Ref Range Status  12/25/2020 9.0 (A) 4.0 - 5.6 % Final   Hgb A1c MFr Bld  Date Value Ref Range Status  06/21/2020 8.8 (H) 4.8 - 5.6 % Final    Comment:             Prediabetes: 5.7 - 6.4          Diabetes: >6.4          Glycemic control for adults with diabetes: <7.0          Passed - Cr in normal range and within 360 days    Creatinine, Ser  Date Value Ref Range Status  01/16/2021 0.75 0.61 - 1.24 mg/dL Final         Passed - Valid encounter within last 6 months    Recent Outpatient Visits          2 months ago DM  (diabetes mellitus), type 2 with complications [facility])   [facility] Glean [facility], [facility]   5 months ago DM (diabetes mellitus), type 2 with complications [facility])   [place], Laura H, MD   8 months ago Annual physical exam   [facility]  Glean [facility], MD   1 year ago Essential hypertension   [facility] Glean Hess, MD   1 year ago DM (diabetes mellitus), type 2 with complications [facility])   [place], Laura H, MD      Future Appointments            In 1 month Army Melia Jesse Sans, MD [facility], [place]   In 3 months Army Melia Jesse Sans, MD [facility], [place]   In 5 months Hollice Espy, [place]

## 2021-03-15 NOTE — Telephone Encounter (Signed)
Requested Prescriptions  Pending Prescriptions Disp Refills   losartan-hydrochlorothiazide (HYZAAR) 100-25 MG tablet [Pharmacy Med Name: LOSARTAN-HCTZ 100-25 MG TAB] 90 tablet 1    Sig: TAKE 1 TABLET BY MOUTH EVERY DAY     Cardiovascular: ARB + Diuretic Combos Failed - 03/14/2021 12:26 PM      Failed - Na in normal range and within 180 days    Sodium  Date Value Ref Range Status  01/16/2021 134 (L) 135 - 145 mmol/L Final  06/21/2020 140 134 - 144 mmol/L Final         Failed - Last BP in normal range    BP Readings from Last 1 Encounters:  02/23/21 (!) 144/74         Passed - K in normal range and within 180 days    Potassium  Date Value Ref Range Status  01/16/2021 4.0 3.5 - 5.1 mmol/L Final         Passed - Cr in normal range and within 180 days    Creatinine, Ser  Date Value Ref Range Status  01/16/2021 0.75 0.61 - 1.24 mg/dL Final         Passed - eGFR is 10 or above and within 180 days    GFR calc Af Amer  Date Value Ref Range Status  06/16/2019 72 >59 mL/min/1.73 Final    Comment:    **[facility] currently reports eGFR in compliance with the current**   recommendations of the [employer]. [facility] will   update reporting as new guidelines are published from the NKF-ASN   Task force.    GFR, Estimated  Date Value Ref Range Status  01/16/2021 >60 >60 mL/min Final    Comment:    (NOTE) Calculated using the CKD-EPI Creatinine Equation (2021)    eGFR  Date Value Ref Range Status  06/21/2020 78 >59 mL/min/1.73 Final         Passed - Patient is not pregnant      Passed - Valid encounter within last 6 months    Recent Outpatient Visits          2 months ago DM (diabetes mellitus), type 2 with complications [facility])   [facility]an [facility], [facility]   5 months ago DM (diabetes mellitus), type 2 with complications [facility])   [facility] Glean Hess, MD   8 months ago Annual physical exam   [facility] Glean Hess, MD   1 year ago Essential hypertension   [facility] Glean Hess, MD   1 year ago DM (diabetes mellitus), type 2 with complications [facility])   [place], Laura H, MD      Future Appointments            In 1 month Army Melia Jesse Sans, MD [facility], [place]   In 3 months Army Melia Jesse Sans, MD [facility], [place]   In 5 months Hollice Espy, MD [facility] [place]            metFORMIN (GLUCOPHAGE) 500 MG tablet [Pharmacy Med Name: METFORMIN HCL 500 MG TABLET] 360 tablet 1    Sig: TAKE 2 TABLETS BY MOUTH TWICE DAILY WITH A MEAL     Endocrinology:  Diabetes - Biguanides Failed - 03/14/2021 12:26 PM      Failed - HBA1C is between 0 and 7.9 and within 180 days    Hemoglobin A1C  Date Value Ref Range Status  12/25/2020 9.0 (  A) 4.0 - 5.6 % Final   Hgb A1c MFr Bld  Date Value Ref Range Status  06/21/2020 8.8 (H) 4.8 - 5.6 % Final    Comment:             Prediabetes: 5.7 - 6.4          Diabetes: >6.4          Glycemic control for adults with diabetes: <7.0          Passed - Cr in normal range and within 360 days    Creatinine, Ser  Date Value Ref Range Status  01/16/2021 0.75 0.61 - 1.24 mg/dL Final         Passed - eGFR in normal range and within 360 days    GFR calc Af Amer  Date Value Ref Range Status  06/16/2019 72 >59 mL/min/1.73 Final    Comment:    **[facility] currently reports eGFR in compliance with the current**   recommendations of the [employer]. [facility] will   update reporting as new guidelines are published from the NKF-ASN   Task force.    GFR, Estimated  Date Value Ref Range Status  01/16/2021 >60 >60 mL/min Final    Comment:    (NOTE) Calculated using the CKD-EPI Creatinine Equation (2021)    eGFR  Date Value Ref Range Status  06/21/2020 78 >59 mL/min/1.73 Final         Passed - B12 Level in normal range and within 720 days    Vitamin B-12  Date Value Ref  Range Status  06/16/2019 493 232 - 1,245 pg/mL Final         Passed - Valid encounter within last 6 months    Recent Outpatient Visits          2 months ago DM (diabetes mellitus), type 2 with complications [facility])   [facility] Glean [facility], MD   5 months ago DM (diabetes mellitus), type 2 with complications [facility])   [facility] Glean [facility], [facility]   8 months ago Annual physical exam   [facility] Glean Hess, MD   1 year ago Essential hypertension   [facility], [facility]   1 year ago DM (diabetes mellitus), type 2 with complications [facility])   [place], Laura H, MD      Future Appointments            In 1 month Army Melia Jesse Sans, MD [facility], [place]   In 3 months Army Melia Jesse Sans, MD [facility], [place]   In 5 months Hollice Espy, MD [facility] [place]           Passed - CBC within normal limits and completed in the last 12 months    WBC  Date Value Ref Range Status  01/16/2021 9.4 4.0 - 10.5 K/uL Final   RBC  Date Value Ref Range Status  01/16/2021 3.97 (L) 4.22 - 5.81 MIL/uL Final   Hemoglobin  Date Value Ref Range Status  01/16/2021 13.6 13.0 - 17.0 g/dL Final  06/21/2020 16.0 13.0 - 17.7 g/dL Final   HCT  Date Value Ref Range Status  01/16/2021 38.8 (L) 39.0 - 52.0 % Final   Hematocrit  Date Value Ref Range Status  06/21/2020 44.3 37.5 - 51.0 % Final   MCHC  Date Value Ref Range Status  01/16/2021 35.1 30.0 - 36.0 g/dL Final   [facility]  Date Value Ref Range Status  01/16/2021 34.3 (H) 26.0 - 34.0 pg Final   MCV  Date Value Ref Range Status  01/16/2021 97.7 80.0 - 100.0 fL Final  06/21/2020 94 79 - 97 fL Final   No results found for: PLTCOUNTKUC, LABPLAT, POCPLA RDW  Date Value Ref Range Status  01/16/2021 12.4 11.5 - 15.5 % Final  06/21/2020 12.0 11.6 - 15.4 % Final          citalopram (CELEXA) 20 MG tablet [Pharmacy Med Name:  CITALOPRAM HBR 20 MG TABLET] 90 tablet 1    Sig: TAKE 1 TABLET BY MOUTH EVERY DAY     Psychiatry:  Antidepressants - SSRI Passed - 03/14/2021 12:26 PM      Passed - Completed PHQ-2 or PHQ-9 in the last 360 days      Passed - Valid encounter within last 6 months    Recent Outpatient Visits          2 months ago DM (diabetes mellitus), type 2 with complications [facility])   [facility], [facility]   5 months ago DM (diabetes mellitus), type 2 with complications [facility])   [facility] Gle[facility], [facility]   8 months ago Annual physical exam   [facility] Glean Hess, MD   1 year ago Essential hypertension   [facility] Gle[facility], [facility]   1 year ago DM (diabetes mellitus), type 2 with complications [facility])   [place], Laura H, MD      Future Appointments            In 1 month Army Melia Jesse Sans, MD [facility], [place]   In 3 months Glean Hess, MD [facility], [place]   In 5 months Hollice Espy, MD [facility] [place]            glimepiride (AMARYL) 2 MG tablet [Pharmacy Med Name: GLIMEPIRIDE 2 MG TABLET] 90 tablet 0    Sig: TAKE 1 TABLET BY MOUTH EVERY DAY WITH BREAKFAST     Endocrinology:  Diabetes - Sulfonylureas Failed - 03/14/2021 12:26 PM      Failed - HBA1C is between 0 and 7.9 and within 180 days    Hemoglobin A1C  Date Value Ref Range Status  12/25/2020 9.0 (A) 4.0 - 5.6 % Final   Hgb A1c MFr Bld  Date Value Ref Range Status  06/21/2020 8.8 (H) 4.8 - 5.6 % Final    Comment:             Prediabetes: 5.7 - 6.4          Diabetes: >6.4          Glycemic control for adults with diabetes: <7.0          Passed - Cr in normal range and within 360 days    Creatinine, Ser  Date Value Ref Range Status  01/16/2021 0.75 0.61 - 1.24 mg/dL Final         Passed - Valid encounter within last 6 months    Recent Outpatient Visits          2 months ago DM (diabetes  mellitus), type 2 with complications [facility])   [facility] Gle[facility], [facility]   5 months ago DM (diabetes mellitus), type 2 with complications [facility])   [place], Laura H, MD   8 months ago Annual physical exam   [facility]  Glean Hess, MD   1 year ago Essential hypertension   [facility] Glean Hess, MD   1 year ago DM (diabetes mellitus), type 2 with complications [facility])   [place], Laura H, MD      Future Appointments            In 1 month Army Melia Jesse Sans, MD [facility], [place]   In 3 months Army Melia Jesse Sans, MD [facility], [place]   In 5 months Hollice Espy, [place]

## 2021-04-11 ENCOUNTER — Other Ambulatory Visit: Payer: Self-pay | Admitting: Internal Medicine

## 2021-04-11 ENCOUNTER — Telehealth: Payer: Self-pay | Admitting: Internal Medicine

## 2021-04-11 DIAGNOSIS — E118 Type 2 diabetes mellitus with unspecified complications: Secondary | ICD-10-CM

## 2021-04-11 MED ORDER — EMPAGLIFLOZIN 25 MG PO TABS: 25.0000 mg | ORAL_TABLET | Freq: Every day | ORAL | 0 refills | Status: DC

## 2021-04-11 NOTE — Telephone Encounter (Signed)
Copied from [place] [id number]. Topic: General - Other ?>> Apr 11, 2021 11:01 AM Tessa Lerner A wrote: ?Reason for CRM: The patient's wife has been in contact with their insurance provider  ? ?The patient's insurance provider will not cover FARXIGA 10 MG TABS tablet [[id number]]  ? ?The patient would like to be prescribed an alternative ? ?The patient's wife would like the new prescription submitted to  ?[id number] - [place], [place] ?[address] [place] [address] ?Phone: [phone] Fax: [phone] ?Hours: Not open 24 hours ? ?Please contact further when possible ?

## 2021-04-11 NOTE — Telephone Encounter (Signed)
Spoke to pts wife let her know that medication was sent in. She verbalized understanding. ? ?KP ?

## 2021-04-11 NOTE — Telephone Encounter (Signed)
Pt's spouse called in with 2 alternative meds for him to take, invokana and jardiance for insurane purposes. ?

## 2021-04-11 NOTE — Telephone Encounter (Signed)
Pts wife called back. She is going to call pts insurance to see what is covered and will call back to let us know. ? ?KP ?

## 2021-04-11 NOTE — Telephone Encounter (Signed)
Called pt left VM to call back. Pt needs to call his insurance to see what alternative is covered by his insurance then give [place] a call back to let us know. ? ?PEC may give instructions. ? ?KP ?

## 2021-04-11 NOTE — Telephone Encounter (Signed)
Pease review. Calvin Sanders is not covered by pt insurance. Pt needs alternative sent in.  Invokana and jardiance are alternatives per insurance. ? ?KP

## 2021-04-23 ENCOUNTER — Encounter: Payer: Self-pay | Admitting: Internal Medicine

## 2021-04-23 ENCOUNTER — Ambulatory Visit (INDEPENDENT_AMBULATORY_CARE_PROVIDER_SITE_OTHER): Payer: Medicare Other | Admitting: Internal Medicine

## 2021-04-23 ENCOUNTER — Other Ambulatory Visit: Payer: Self-pay

## 2021-04-23 VITALS — BP 140/78 | HR 71 | Ht 68.0 in | Wt 177.0 lb

## 2021-04-23 DIAGNOSIS — I10 Essential (primary) hypertension: Secondary | ICD-10-CM | POA: Diagnosis not present

## 2021-04-23 DIAGNOSIS — R234 Changes in skin texture: Secondary | ICD-10-CM | POA: Diagnosis not present

## 2021-04-23 DIAGNOSIS — F331 Major depressive disorder, recurrent, moderate: Secondary | ICD-10-CM | POA: Diagnosis not present

## 2021-04-23 DIAGNOSIS — E118 Type 2 diabetes mellitus with unspecified complications: Secondary | ICD-10-CM | POA: Diagnosis not present

## 2021-04-23 MED ORDER — METFORMIN HCL 500 MG PO TABS: 1000.0000 mg | ORAL_TABLET | Freq: Two times a day (BID) | ORAL | 1 refills | Status: DC

## 2021-04-23 MED ORDER — CITALOPRAM HYDROBROMIDE 20 MG PO TABS: 20.0000 mg | ORAL_TABLET | Freq: Every day | ORAL | 1 refills | Status: DC

## 2021-04-23 MED ORDER — LOSARTAN POTASSIUM-HCTZ 100-25 MG PO TABS: 1.0000 | ORAL_TABLET | Freq: Every day | ORAL | 1 refills | Status: DC

## 2021-04-23 NOTE — Patient Instructions (Addendum)
Please schedule diabetic Eye exam and ask them to send me the office visit note. ? ?Keep the feet moisturized with lotion. ?Trim your nails carefully. ?No further treatment to toe lesion unless it becomes pain or swells or has drainage. ?

## 2021-04-23 NOTE — Progress Notes (Signed)
Date:  04/23/2021   Name:  Calvin Sanders   DOB:  Nov 28, 1955   MRN:  [id number]   Chief Complaint: Diabetes (Wife would like if we would check right foot skin.)  Diabetes He presents for his follow-up diabetic visit. He has type 2 diabetes mellitus. His disease course has been stable. Pertinent negatives for hypoglycemia include no headaches or tremors. There are no diabetic associated symptoms. Pertinent negatives for diabetes include no chest pain, no fatigue, no polydipsia and no polyuria. Pertinent negatives for diabetic complications include no CVA. Current diabetic treatment includes oral agent (monotherapy) (metformin increased last visit). He is compliant with treatment all of the time.  Hypertension This is a chronic problem. The problem is controlled. Pertinent negatives include no chest pain, headaches, palpitations or shortness of breath. Past treatments include angiotensin blockers and diuretics. The current treatment provides significant improvement. There are no compliance problems.  There is no history of kidney disease, CAD/MI or CVA.   Lab Results  Component Value Date   NA 134 (L) 01/16/2021   K 4.0 01/16/2021   CO2 26 01/16/2021   GLUCOSE 226 (H) 01/16/2021   BUN 10 01/16/2021   CREATININE 0.75 01/16/2021   CALCIUM 8.5 (L) 01/16/2021   EGFR 78 06/21/2020   GFRNONAA >60 01/16/2021   Lab Results  Component Value Date   CHOL 175 06/21/2020   HDL 41 06/21/2020   LDLCALC 108 (H) 06/21/2020   TRIG 144 06/21/2020   CHOLHDL 4.3 06/21/2020   Lab Results  Component Value Date   TSH 1.560 06/21/2020   Lab Results  Component Value Date   HGBA1C 9.0 (A) 12/25/2020   Lab Results  Component Value Date   WBC 9.4 01/16/2021   HGB 13.6 01/16/2021   HCT 38.8 (L) 01/16/2021   MCV 97.7 01/16/2021   PLT 200 01/16/2021   Lab Results  Component Value Date   ALT 30 06/21/2020   AST 21 06/21/2020   ALKPHOS 94 06/21/2020   BILITOT 0.9 06/21/2020   Lab Results   Component Value Date   VD25OH 67.0 06/16/2019     Review of Systems  Constitutional:  Negative for appetite change, fatigue and unexpected weight change.  Eyes:  Negative for visual disturbance.  Respiratory:  Negative for cough, shortness of breath and wheezing.   Cardiovascular:  Negative for chest pain, palpitations and leg swelling.  Gastrointestinal:  Negative for abdominal pain and blood in stool.  Endocrine: Negative for polydipsia and polyuria.  Genitourinary:  Negative for dysuria and hematuria.  Skin:  Positive for wound. Negative for color change and rash.  Neurological:  Negative for tremors, numbness and headaches.  Psychiatric/Behavioral:  Negative for dysphoric mood.    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     Past Surgical History:  Procedure Laterality Date   PELVIC LYMPH NODE DISSECTION Bilateral 01/15/2021   Procedure: PELVIC LYMPH NODE DISSECTION;  Surgeon: Hollice Espy, MD;  Location: [facility] ORS;  Service: Urology;  Laterality: Bilateral;   PROSTATE BIOPSY N/A 10/24/2020   Procedure: PROSTATE BIOPSY;  Surgeon:  Abbie Sons, MD;  Location: [facility] ORS;  Service: Urology;  Laterality: N/A;   ROBOT ASSISTED LAPAROSCOPIC RADICAL PROSTATECTOMY N/A 01/15/2021   Procedure: XI ROBOTIC ASSISTED LAPAROSCOPIC RADICAL PROSTATECTOMY;  Surgeon: Hollice Espy, MD;  Location: [facility] ORS;  Service: Urology;  Laterality: N/A;   TRANSRECTAL ULTRASOUND N/A 10/24/2020   Procedure: TRANSRECTAL ULTRASOUND;  Surgeon: Abbie Sons, MD;  Location: [facility] ORS;  Service: Urology;  Laterality: N/A;  Confirmed with Kim in Ultrasound    Social History   Tobacco Use   Smoking status: Former    Packs/day: 0.25    Years: 45.00    Pack years: 11.25    Types: Cigarettes    Start date: 02/11/1973    Quit date: 09/11/2020    Years since quitting: 0.6   Smokeless tobacco: Never   Tobacco comments:    3 a day   Vaping Use   Vaping Use: Never used  Substance Use Topics   Alcohol use: Yes    Alcohol/week: 14.0 standard drinks    Types: 14 Cans of beer per week    Comment: back up to 1-2 beers per day   Drug use: No     Medication list has been reviewed and updated.  Current Meds  Medication Sig   atorvastatin (LIPITOR) 40 MG tablet TAKE 1 TABLET BY MOUTH EVERY DAY   Cholecalciferol (VITAMIN D3) 2000 units TABS Take 2,000 Units by mouth daily.   citalopram (CELEXA) 20 MG tablet TAKE 1 TABLET BY MOUTH EVERY DAY   Cyanocobalamin (B-12) 500 MCG TABS Take 1 tablet by mouth daily.   docusate sodium (COLACE) 100 MG capsule Take 1 capsule (100 mg total) by mouth 2 (two) times daily.   empagliflozin (JARDIANCE) 25 MG TABS tablet Take 1 tablet (25 mg total) by mouth daily before breakfast.   glimepiride (AMARYL) 2 MG tablet TAKE 1 TABLET BY MOUTH EVERY DAY WITH BREAKFAST   losartan-hydrochlorothiazide (HYZAAR) 100-25 MG tablet TAKE 1 TABLET BY MOUTH EVERY DAY   metFORMIN (GLUCOPHAGE) 500 MG tablet Take 2 tablets (1,000 mg total) by mouth 2 (two) times daily with a meal.   oxybutynin (DITROPAN) 5 MG tablet Take 1 tablet (5 mg  total) by mouth every 8 (eight) hours as needed for bladder spasms.   oxyCODONE-acetaminophen (PERCOCET/ROXICET) 5-325 MG tablet Take 1-2 tablets by mouth every 6 (six) hours as needed for moderate pain.   sildenafil (REVATIO) 20 MG tablet 2-5 tablets as needed one hour prior to intercourse.    PHQ 2/9 Scores 04/23/2021 12/25/2020 11/20/2020 10/13/2020  PHQ - 2 Score 0 0 0 0  PHQ- 9 Score 0 1 - 0    GAD 7 : Generalized Anxiety Score 04/23/2021 12/25/2020 10/13/2020 06/21/2020  Nervous, Anxious, on Edge 0 0 0 0  Control/stop worrying 0 0 0 0  Worry too much - different things 0 0 0 0  Trouble relaxing 0 0 0 0  Restless 0 0 0 0  Easily annoyed or irritable 0 0 0 0  Afraid - awful might happen 0 0 0 0  Total GAD 7 Score  0 0 0 0  Anxiety Difficulty Not difficult at all Not difficult at all - Not difficult at all    BP Readings from Last 3 Encounters:  04/23/21 140/78  02/23/21 (!) 144/74  01/16/21 (!) 152/69    Physical Exam Vitals and nursing note reviewed.  Constitutional:      General: He is not in acute distress.    Appearance: Normal appearance. He is well-developed.  HENT:     Head: Normocephalic and atraumatic.  Neck:     Vascular: No carotid bruit.  Cardiovascular:     Rate and Rhythm: Normal rate and regular rhythm.     Pulses: Normal pulses.     Heart sounds: No murmur heard. Pulmonary:     Effort: Pulmonary effort is normal. No respiratory distress.     Breath sounds: No wheezing or rhonchi.  Musculoskeletal:     Cervical back: Normal range of motion.     Right lower leg: No edema.     Left lower leg: No edema.  Lymphadenopathy:     Cervical: No cervical adenopathy.  Skin:    General: Skin is warm and dry.     Findings: Lesion present. No rash.     Comments: Thin eschar on dorsum of right second toe - no tenderness, warmth or drainage  Neurological:     Mental Status: He is alert and oriented to person, place, and time.  Psychiatric:        Mood and Affect:  Mood normal.        Behavior: Behavior normal.    Wt Readings from Last 3 Encounters:  04/23/21 177 lb (80.3 kg)  02/23/21 174 lb (78.9 kg)  01/15/21 179 lb 14.3 oz (81.6 kg)    BP 140/78    Pulse 71    Ht 5' 8" (1.727 m)    Wt 177 lb (80.3 kg)    SpO2 95%    BMI 26.91 kg/m   Assessment and Plan: 1. DM (diabetes mellitus), type 2 with complications ([place]) Clinically stable by exam and report without s/s of hypoglycemia. DM complicated by hypertension and dyslipidemia. Tolerating medications well without side effects or other concerns. Metformin increased last visit. Reminded to schedule DM eye exam - metFORMIN (GLUCOPHAGE) 500 MG tablet; Take 2 tablets (1,000 mg total) by mouth 2 (two) times daily with a meal.  Dispense: 360 tablet; Refill: 1 - Basic metabolic panel - Hemoglobin A1c - Microalbumin / creatinine urine ratio  2. Essential hypertension Clinically stable exam with well controlled BP. Tolerating medications without side effects at this time. Pt to continue current regimen and low sodium diet; benefits of regular exercise as able discussed. - losartan-hydrochlorothiazide (HYZAAR) 100-25 MG tablet; Take 1 tablet by mouth daily.  Dispense: 90 tablet; Refill: 1  3. Major depressive disorder, recurrent episode, moderate ([facility]) Clinically stable on current regimen with good control of symptoms, No SI or HI. Will continue current therapy. - citalopram (CELEXA) 20 MG tablet; Take 1 tablet (20 mg total) by mouth daily.  Dispense: 90 tablet; Refill: 1  4. Eschar of toe Local care only with moisturizers Follow up if there is pain, swelling or drainage   Partially dictated using Editor, commissioning. Any errors are unintentional.  Halina Maidens, MD [facility]  04/23/2021

## 2021-04-24 LAB — MICROALBUMIN / CREATININE URINE RATIO
Creatinine, Urine: 88.9 mg/dL
Microalb/Creat Ratio: 17 mg/g creat (ref 0–29)
Microalbumin, Urine: 15.1 ug/mL

## 2021-04-24 LAB — BASIC METABOLIC PANEL
BUN/Creatinine Ratio: 16 (ref 10–24)
BUN: 17 mg/dL (ref 8–27)
CO2: 23 mmol/L (ref 20–29)
Calcium: 10.5 mg/dL — ABNORMAL HIGH (ref 8.6–10.2)
Chloride: 99 mmol/L (ref 96–106)
Creatinine, Ser: 1.07 mg/dL (ref 0.76–1.27)
Glucose: 220 mg/dL — ABNORMAL HIGH (ref 70–99)
Potassium: 3.9 mmol/L (ref 3.5–5.2)
Sodium: 140 mmol/L (ref 134–144)
eGFR: 76 mL/min/{1.73_m2} (ref >59)

## 2021-04-24 LAB — HEMOGLOBIN A1C
Est. average glucose Bld gHb Est-mCnc: 246 mg/dL
Hgb A1c MFr Bld: 10.2 % — ABNORMAL HIGH (ref 4.8–5.6)

## 2021-05-24 ENCOUNTER — Other Ambulatory Visit
Admission: RE | Admit: 2021-05-24 | Discharge: 2021-05-24 | Disposition: A | Discharge status: Home / Self Care | Payer: Medicare Other | Attending: Urology | Admitting: Urology

## 2021-05-24 DIAGNOSIS — C61 Malignant neoplasm of prostate: Secondary | ICD-10-CM | POA: Diagnosis present

## 2021-05-24 DIAGNOSIS — N393 Stress incontinence (female) (male): Secondary | ICD-10-CM | POA: Diagnosis present

## 2021-05-24 LAB — PSA: Prostatic Specific Antigen: 0.01 ng/mL (ref 0.00–4.00)

## 2021-06-09 ENCOUNTER — Other Ambulatory Visit: Payer: Self-pay | Admitting: Internal Medicine

## 2021-06-09 DIAGNOSIS — I10 Essential (primary) hypertension: Secondary | ICD-10-CM

## 2021-06-09 DIAGNOSIS — E118 Type 2 diabetes mellitus with unspecified complications: Secondary | ICD-10-CM

## 2021-06-09 DIAGNOSIS — E1169 Type 2 diabetes mellitus with other specified complication: Secondary | ICD-10-CM

## 2021-06-09 DIAGNOSIS — F331 Major depressive disorder, recurrent, moderate: Secondary | ICD-10-CM

## 2021-06-11 NOTE — Telephone Encounter (Signed)
Metformin, Citalopram and hyzaar were refilled 04/23/2021 with a 6 months supply. ?Requested Prescriptions  ?Pending Prescriptions Disp Refills  ?? glimepiride (AMARYL) 2 MG tablet [Pharmacy Med Name: GLIMEPIRIDE 2 MG TABLET] 90 tablet 0  ?  Sig: TAKE 1 TABLET BY MOUTH EVERY DAY WITH BREAKFAST  ?  ? Endocrinology:  Diabetes - Sulfonylureas Failed - 06/09/2021 11:06 AM  ?  ?  Failed - HBA1C is between 0 and 7.9 and within 180 days  ?  Hgb A1c MFr Bld  ?Date Value Ref Range Status  ?04/23/2021 10.2 (H) 4.8 - 5.6 % Final  ?  Comment:  ?           Prediabetes: 5.7 - 6.4 ?         Diabetes: >6.4 ?         Glycemic control for adults with diabetes: <7.0 ?  ?   ?  ?  Passed - Cr in normal range and within 360 days  ?  Creatinine, Ser  ?Date Value Ref Range Status  ?04/23/2021 1.07 0.76 - 1.27 mg/dL Final  ?   ?  ?  Passed - Valid encounter within last 6 months  ?  Recent Outpatient Visits   ?      ? 1 month ago DM (diabetes mellitus), type 2 with complications ([place])  ? [facility] Glean Hess, MD  ? 5 months ago DM (diabetes mellitus), type 2 with complications [facility])  ? [facility] Glean Hess, MD  ? 8 months ago DM (diabetes mellitus), type 2 with complications [facility])  ? [facility] Glean Hess, MD  ? 11 months ago Annual physical exam  ? [facility] Glean Hess, MD  ? 1 year ago Essential hypertension  ? [facility] Glean Hess, MD  ?  ?  ?Future Appointments   ?        ? In 3 weeks Army Melia Jesse Sans, MD [facility] [place]  ? In 2 months Hollice Espy, MD [place]  ?  ? ?  ?  ?  ?Refused Prescriptions Disp Refills  ?? metFORMIN (GLUCOPHAGE) 500 MG tablet [Pharmacy Med Name: METFORMIN HCL 500 MG TABLET] 360 tablet 1  ?  Sig: TAKE 2 TABLETS BY MOUTH TWICE DAILY WITH A MEAL  ?  ? Endocrinology:  Diabetes - Biguanides Failed - 06/09/2021 11:06 AM  ?  ?  Failed - HBA1C is between 0 and 7.9 and within 180 days  ?   Hgb A1c MFr Bld  ?Date Value Ref Range Status  ?04/23/2021 10.2 (H) 4.8 - 5.6 % Final  ?  Comment:  ?           Prediabetes: 5.7 - 6.4 ?         Diabetes: >6.4 ?         Glycemic control for adults with diabetes: <7.0 ?  ?   ?  ?  Failed - B12 Level in normal range and within 720 days  ?  Vitamin B-12  ?Date Value Ref Range Status  ?06/16/2019 493 232 - 1,245 pg/mL Final  ?   ?  ?  Passed - Cr in normal range and within 360 days  ?  Creatinine, Ser  ?Date Value Ref Range Status  ?04/23/2021 1.07 0.76 - 1.27 mg/dL Final  ?   ?  ?  Passed - eGFR in normal range and within 360 days  ?  GFR calc Af Amer  ?Date Value Ref Range Status  ?06/16/2019 72 >59 mL/min/1.73 Final  ?  Comment:  ?  **[facility] currently reports eGFR in compliance with the current** ?  recommendations of the [employer]. [facility] will ?  update reporting as new guidelines are published from the NKF-ASN ?  Task force. ?  ? ?GFR, Estimated  ?Date Value Ref Range Status  ?01/16/2021 >60 >60 mL/min Final  ?  Comment:  ?  (NOTE) ?Calculated using the CKD-EPI Creatinine Equation (2021) ?  ? ?eGFR  ?Date Value Ref Range Status  ?04/23/2021 76 >59 mL/min/1.73 Final  ?   ?  ?  Passed - Valid encounter within last 6 months  ?  Recent Outpatient Visits   ?      ? 1 month ago DM (diabetes mellitus), type 2 with complications ([place])  ? [facility] Glean Hess, MD  ? 5 months ago DM (diabetes mellitus), type 2 with complications [facility])  ? [facility] Glean Hess, MD  ? 8 months ago DM (diabetes mellitus), type 2 with complications [facility])  ? [facility] Glean Hess, MD  ? 11 months ago Annual physical exam  ? [facility] Glean Hess, MD  ? 1 year ago Essential hypertension  ? [facility] Glean [facility], [facility]  ?  ?  ?Future Appointments   ?        ? In 3 weeks Army Melia Jesse Sans, MD [facility] [place]  ? In 2 months Hollice Espy, MD [place]  ?   ? ?  ?  ?  Passed - CBC within normal limits and completed in the last 12 months  ?  WBC  ?Date Value Ref Range Status  ?01/16/2021 9.4 4.0 - 10.5 K/uL Final  ? ?RBC  ?Date Value Ref Range Status  ?01/16/2021 3.97 (L) 4.22 - 5.81 MIL/uL Final  ? ?Hemoglobin  ?Date Value Ref Range Status  ?01/16/2021 13.6 13.0 - 17.0 g/dL Final  ?06/21/2020 16.0 13.0 - 17.7 g/dL Final  ? ?HCT  ?Date Value Ref Range Status  ?01/16/2021 38.8 (L) 39.0 - 52.0 % Final  ? ?Hematocrit  ?Date Value Ref Range Status  ?06/21/2020 44.3 37.5 - 51.0 % Final  ? ?MCHC  ?Date Value Ref Range Status  ?01/16/2021 35.1 30.0 - 36.0 g/dL Final  ? ?MCH  ?Date Value Ref Range Status  ?01/16/2021 34.3 (H) 26.0 - 34.0 pg Final  ? ?MCV  ?Date Value Ref Range Status  ?01/16/2021 97.7 80.0 - 100.0 fL Final  ?06/21/2020 94 79 - 97 fL Final  ? ?No results found for: PLTCOUNTKUC, LABPLAT, [place] ?RDW  ?Date Value Ref Range Status  ?01/16/2021 12.4 11.5 - 15.5 % Final  ?06/21/2020 12.0 11.6 - 15.4 % Final  ? ?  ?  ?  ?? citalopram (CELEXA) 20 MG tablet [Pharmacy Med Name: CITALOPRAM HBR 20 MG TABLET] 90 tablet 1  ?  Sig: TAKE 1 TABLET BY MOUTH EVERY DAY  ?  ? Psychiatry:  Antidepressants - SSRI Passed - 06/09/2021 11:06 AM  ?  ?  Passed - Completed PHQ-2 or PHQ-9 in the last 360 days  ?  ?  Passed - Valid encounter within last 6 months  ?  Recent Outpatient Visits   ?      ? 1 month ago DM (diabetes mellitus), type 2 with complications ([place])  ? [place],  MD  ? 5 months ago DM (diabetes mellitus), type 2 with complications ([place])  ? [facility] Glean Hess, MD  ? 8 months ago DM (diabetes mellitus), type 2 with complications [facility])  ? [facility] Glean Hess, MD  ? 11 months ago Annual physical exam  ? [facility] Glean Hess, MD  ? 1 year ago Essential hypertension  ? [facility] Glean Hess, MD  ?  ?  ?Future Appointments   ?        ? In 3 weeks Army Melia Jesse Sans, MD  [place], [place]  ? In 2 months Hollice Espy, MD [place]  ?  ? ?  ?  ?  ?? losartan-hydrochlorothiazide (HYZAAR) 100-25 MG tablet [Pharmacy Med Name: LOSARTAN-HCTZ 100-25 MG TAB] 90 tablet 1  ?  Sig: TAKE 1 TABLET BY MOUTH EVERY DAY  ?  ? Cardiovascular: ARB + Diuretic Combos Failed - 06/09/2021 11:06 AM  ?  ?  Failed - Last BP in normal range  ?  BP Readings from Last 1 Encounters:  ?04/23/21 140/78  ?   ?  ?  Passed - K in normal range and within 180 days  ?  Potassium  ?Date Value Ref Range Status  ?04/23/2021 3.9 3.5 - 5.2 mmol/L Final  ?   ?  ?  Passed - Na in normal range and within 180 days  ?  Sodium  ?Date Value Ref Range Status  ?04/23/2021 140 134 - 144 mmol/L Final  ?   ?  ?  Passed - Cr in normal range and within 180 days  ?  Creatinine, Ser  ?Date Value Ref Range Status  ?04/23/2021 1.07 0.76 - 1.27 mg/dL Final  ?   ?  ?  Passed - eGFR is 10 or above and within 180 days  ?  GFR calc Af Amer  ?Date Value Ref Range Status  ?06/16/2019 72 >59 mL/min/1.73 Final  ?  Comment:  ?  **[facility] currently reports eGFR in compliance with the current** ?  recommendations of the [employer]. [facility] will ?  update reporting as new guidelines are published from the NKF-ASN ?  Task force. ?  ? ?GFR, Estimated  ?Date Value Ref Range Status  ?01/16/2021 >60 >60 mL/min Final  ?  Comment:  ?  (NOTE) ?Calculated using the CKD-EPI Creatinine Equation (2021) ?  ? ?eGFR  ?Date Value Ref Range Status  ?04/23/2021 76 >59 mL/min/1.73 Final  ?   ?  ?  Passed - Patient is not pregnant  ?  ?  Passed - Valid encounter within last 6 months  ?  Recent Outpatient Visits   ?      ? 1 month ago DM (diabetes mellitus), type 2 with complications ([place])  ? [facility] Glean Hess, MD  ? 5 months ago DM (diabetes mellitus), type 2 with complications [facility])  ? [facility] Glean [facility], [facility]  ? 8 months ago DM (diabetes mellitus), type 2 with complications [facility])  ?  [facility] Glean Hess, MD  ? 11 months ago Annual physical exam  ? [facility] Glean Hess, MD  ? 1 year ago Essential hypertension  ? [facility] Halina Maidens [facility] [place]

## 2021-06-11 NOTE — Telephone Encounter (Signed)
Requested Prescriptions  ?Pending Prescriptions Disp Refills  ?? atorvastatin (LIPITOR) 40 MG tablet [Pharmacy Med Name: ATORVASTATIN '40MG'$  TABLETS] 90 tablet 1  ?  Sig: TAKE 1 TABLET BY MOUTH DAILY  ?  ? Cardiovascular:  Antilipid - Statins Failed - 06/09/2021 11:03 AM  ?  ?  Failed - Lipid Panel in normal range within the last 12 months  ?  Cholesterol, Total  ?Date Value Ref Range Status  ?06/21/2020 175 100 - 199 mg/dL Final  ? ?LDL Chol Calc (NIH)  ?Date Value Ref Range Status  ?06/21/2020 108 (H) 0 - 99 mg/dL Final  ? ?HDL  ?Date Value Ref Range Status  ?06/21/2020 41 >39 mg/dL Final  ? ?Triglycerides  ?Date Value Ref Range Status  ?06/21/2020 144 0 - 149 mg/dL Final  ? ?  ?  ?  Passed - Patient is not pregnant  ?  ?  Passed - Valid encounter within last 12 months  ?  Recent Outpatient Visits   ?      ? 1 month ago DM (diabetes mellitus), type 2 with complications ([place])  ? [facility] Glean Hess, MD  ? 5 months ago DM (diabetes mellitus), type 2 with complications [facility])  ? [facility] Glean Hess, MD  ? 8 months ago DM (diabetes mellitus), type 2 with complications [facility])  ? [facility] Glean Hess, MD  ? 11 months ago Annual physical exam  ? [facility] Glean Hess, MD  ? 1 year ago Essential hypertension  ? [facility] Glean Hess, MD  ?  ?  ?Future Appointments   ?        ? In 3 weeks Army Melia Jesse Sans, MD [facility] [place]  ? In 2 months Hollice Espy, MD [place]  ?  ? ?  ?  ?  ? ?

## 2021-06-29 ENCOUNTER — Other Ambulatory Visit: Payer: Self-pay | Admitting: Internal Medicine

## 2021-06-29 DIAGNOSIS — I10 Essential (primary) hypertension: Secondary | ICD-10-CM

## 2021-06-29 NOTE — Telephone Encounter (Signed)
Refused the Hyzaar 100-25 mg tablets because requested too soon.  04/23/2021 #90, 1 refill was given.

## 2021-07-04 ENCOUNTER — Ambulatory Visit (INDEPENDENT_AMBULATORY_CARE_PROVIDER_SITE_OTHER): Payer: Medicare Other | Admitting: Internal Medicine

## 2021-07-04 ENCOUNTER — Encounter: Payer: Self-pay | Admitting: Internal Medicine

## 2021-07-04 ENCOUNTER — Other Ambulatory Visit: Payer: Self-pay | Admitting: Internal Medicine

## 2021-07-04 VITALS — BP 134/62 | HR 67 | Ht 68.0 in | Wt 178.0 lb

## 2021-07-04 DIAGNOSIS — Z0000 Encounter for general adult medical examination without abnormal findings: Secondary | ICD-10-CM

## 2021-07-04 DIAGNOSIS — E785 Hyperlipidemia, unspecified: Secondary | ICD-10-CM

## 2021-07-04 DIAGNOSIS — E1169 Type 2 diabetes mellitus with other specified complication: Secondary | ICD-10-CM

## 2021-07-04 DIAGNOSIS — E538 Deficiency of other specified B group vitamins: Secondary | ICD-10-CM

## 2021-07-04 DIAGNOSIS — E118 Type 2 diabetes mellitus with unspecified complications: Secondary | ICD-10-CM

## 2021-07-04 DIAGNOSIS — Z1211 Encounter for screening for malignant neoplasm of colon: Secondary | ICD-10-CM | POA: Diagnosis not present

## 2021-07-04 DIAGNOSIS — C61 Malignant neoplasm of prostate: Secondary | ICD-10-CM

## 2021-07-04 DIAGNOSIS — I10 Essential (primary) hypertension: Secondary | ICD-10-CM

## 2021-07-04 DIAGNOSIS — F331 Major depressive disorder, recurrent, moderate: Secondary | ICD-10-CM

## 2021-07-04 NOTE — Progress Notes (Signed)
Date:  07/04/2021   Name:  Calvin Sanders   DOB:  [birth year]   MRN:  [id number]   Chief Complaint: Annual Exam Calvin Sanders is a 66 y.o. male who presents today for his Complete Annual Exam. He feels well. He reports doing yard work. He reports he is sleeping well.   Colonoscopy: 06/2011   Immunization History  Administered Date(s) Administered   Fluad Quad(high Dose 65+) 10/20/2019   Influenza,inj,Quad PF,6+ Mos 11/26/2018   Moderna Sars-Covid-2 Vaccination 06/30/2019, 07/28/2019   Pneumococcal Conjugate-13 09/16/2016   Pneumococcal Polysaccharide-23 02/24/2019   Tdap 09/16/2016   Health Maintenance Due  Topic Date Due   Zoster Vaccines- Shingrix (1 of 2) Never done   COVID-19 Vaccine (3 - Moderna risk series) 08/25/2019   OPHTHALMOLOGY EXAM  03/02/2020   COLONOSCOPY (Pts 45-33yr Insurance coverage will need to be confirmed)  07/01/2021    Lab Results  Component Value Date   PSA1 2.6 05/05/2018   PSA1 2.5 11/04/2017   PSA1 2.2 09/21/2015    Diabetes He presents for his follow-up diabetic visit. He has type 2 diabetes mellitus. His disease course has been worsening. Pertinent negatives for hypoglycemia include no dizziness, headaches or nervousness/anxiousness. Pertinent negatives for diabetes include no chest pain and no fatigue. Current diabetic treatments: jardiance, amaryl, metformin. He is compliant with treatment all of the time.  Hypertension This is a chronic problem. The problem is controlled. Pertinent negatives include no chest pain, headaches, palpitations or shortness of breath. Past treatments include angiotensin blockers and diuretics.  Hyperlipidemia This is a chronic problem. Pertinent negatives include no chest pain, myalgias or shortness of breath. Current antihyperlipidemic treatment includes statins.   Lab Results  Component Value Date   NA 140 04/23/2021   K 3.9 04/23/2021   CO2 23 04/23/2021   GLUCOSE 220 (H) 04/23/2021   BUN 17 04/23/2021    CREATININE 1.07 04/23/2021   CALCIUM 10.5 (H) 04/23/2021   EGFR 76 04/23/2021   GFRNONAA >60 01/16/2021   Lab Results  Component Value Date   CHOL 175 06/21/2020   HDL 41 06/21/2020   LDLCALC 108 (H) 06/21/2020   TRIG 144 06/21/2020   CHOLHDL 4.3 06/21/2020   Lab Results  Component Value Date   TSH 1.560 06/21/2020   Lab Results  Component Value Date   HGBA1C 10.2 (H) 04/23/2021   Lab Results  Component Value Date   WBC 9.4 01/16/2021   HGB 13.6 01/16/2021   HCT 38.8 (L) 01/16/2021   MCV 97.7 01/16/2021   PLT 200 01/16/2021   Lab Results  Component Value Date   ALT 30 06/21/2020   AST 21 06/21/2020   ALKPHOS 94 06/21/2020   BILITOT 0.9 06/21/2020   Lab Results  Component Value Date   VD25OH 67.0 06/16/2019     Review of Systems  Constitutional:  Negative for appetite change, chills, diaphoresis, fatigue and unexpected weight change.  HENT:  Negative for hearing loss, tinnitus, trouble swallowing and voice change.   Eyes:  Negative for visual disturbance.  Respiratory:  Negative for choking, shortness of breath and wheezing.   Cardiovascular:  Negative for chest pain, palpitations and leg swelling.  Gastrointestinal:  Negative for abdominal pain, blood in stool, constipation and diarrhea.  Genitourinary:  Negative for difficulty urinating, dysuria and frequency.  Musculoskeletal:  Negative for arthralgias, back pain and myalgias.  Skin:  Negative for color change and rash.  Neurological:  Negative for dizziness, syncope and headaches.  Hematological:  Negative for  adenopathy.  Psychiatric/Behavioral:  Negative for dysphoric mood and sleep disturbance. The patient is not nervous/anxious.    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     Past Surgical History:  Procedure Laterality Date   PELVIC LYMPH NODE DISSECTION Bilateral 01/15/2021   Procedure: PELVIC LYMPH NODE DISSECTION;  Surgeon: Hollice Espy, MD;  Location: [facility] ORS;  Service: Urology;  Laterality: Bilateral;   PROSTATE BIOPSY N/A 10/24/2020   Procedure: PROSTATE BIOPSY;  Surgeon: Abbie Sons, MD;  Location: [facility] ORS;  Service: Urology;  Laterality: N/A;   ROBOT ASSISTED LAPAROSCOPIC RADICAL PROSTATECTOMY N/A 01/15/2021   Procedure: XI ROBOTIC ASSISTED LAPAROSCOPIC RADICAL PROSTATECTOMY;  Surgeon: Hollice Espy, MD;  Location: [facility] ORS;  Service: Urology;  Laterality: N/A;   TRANSRECTAL ULTRASOUND N/A 10/24/2020   Procedure: TRANSRECTAL ULTRASOUND;  Surgeon: Abbie Sons, MD;  Location: [facility] ORS;  Service: Urology;  Laterality: N/A;  Confirmed with Kim in Ultrasound    Social History   Tobacco Use   Smoking status: Former    Packs/day: 0.25    Years: 45.00    Pack years: 11.25    Types: Cigarettes    Start date: 02/11/1973    Quit date: 09/11/2020    Years since quitting: 0.8   Smokeless tobacco: Never   Tobacco comments:    3 a day   Vaping Use   Vaping Use: Never used  Substance Use Topics   Alcohol use: Yes    Alcohol/week: 14.0 standard drinks    Types: 14 Cans of beer per week    Comment: back up to 1-2 beers per day   Drug use: No     Medication list has been  reviewed and updated.  Current Meds  Medication Sig   atorvastatin (LIPITOR) 40 MG tablet TAKE 1 TABLET BY MOUTH DAILY   Cholecalciferol (VITAMIN D3) 2000 units TABS Take 2,000 Units by mouth daily.   citalopram (CELEXA) 20 MG tablet Take 1 tablet (20 mg total) by mouth daily.   Cyanocobalamin (B-12) 500 MCG TABS Take 1 tablet by mouth daily.   docusate sodium (COLACE) 100 MG capsule Take 1 capsule (100 mg total) by mouth 2 (two) times daily.   empagliflozin (JARDIANCE) 25 MG TABS tablet Take 1 tablet (25 mg total) by mouth daily before breakfast.   glimepiride (AMARYL) 2 MG tablet TAKE 1 TABLET BY MOUTH EVERY DAY WITH BREAKFAST   losartan-hydrochlorothiazide (HYZAAR) 100-25 MG tablet Take 1 tablet by mouth daily.   metFORMIN (GLUCOPHAGE) 500 MG tablet Take 2 tablets (1,000 mg total) by mouth 2 (two) times daily with a meal.   oxybutynin (DITROPAN) 5 MG tablet Take 1 tablet (5 mg total) by mouth every 8 (eight) hours as needed  for bladder spasms.   oxyCODONE-acetaminophen (PERCOCET/ROXICET) 5-325 MG tablet Take 1-2 tablets by mouth every 6 (six) hours as needed for moderate pain.   sildenafil (REVATIO) 20 MG tablet 2-5 tablets as needed one hour prior to intercourse.       04/23/2021    9:43 AM 12/25/2020    1:17 PM 10/13/2020    3:26 PM 06/21/2020    9:20 AM  GAD 7 : Generalized Anxiety Score  Nervous, Anxious, on Edge 0 0 0 0  Control/stop worrying 0 0 0 0  Worry too much - different things 0 0 0 0  Trouble relaxing 0 0 0 0  Restless 0 0 0 0  Easily annoyed or irritable 0 0 0 0  Afraid - awful might happen 0 0 0 0  Total GAD 7 Score 0 0 0 0  Anxiety Difficulty Not difficult at all Not difficult at all  Not difficult at all       07/04/2021    9:27 AM  Depression screen PHQ 2/9  Decreased Interest 0  Down, Depressed, Hopeless 0  PHQ - 2 Score 0  Altered sleeping 0  Tired, decreased energy 0  Change in appetite 0  Feeling bad or failure about yourself  0  Trouble  concentrating 0  Moving slowly or fidgety/restless 0  Suicidal thoughts 0  PHQ-9 Score 0  Difficult doing work/chores Not difficult at all    BP Readings from Last 3 Encounters:  07/04/21 134/62  04/23/21 140/78  02/23/21 (!) 144/74    Physical Exam Vitals and nursing note reviewed.  Constitutional:      Appearance: Normal appearance. He is well-developed.  HENT:     Head: Normocephalic.     Right Ear: Tympanic membrane, ear canal and external ear normal.     Left Ear: Tympanic membrane, ear canal and external ear normal.     Nose: Nose normal.  Eyes:     Conjunctiva/sclera: Conjunctivae normal.     Pupils: Pupils are equal, round, and reactive to light.  Neck:     Thyroid: No thyromegaly.     Vascular: No carotid bruit.  Cardiovascular:     Rate and Rhythm: Normal rate and regular rhythm.     Heart sounds: Normal heart sounds.  Pulmonary:     Effort: Pulmonary effort is normal.     Breath sounds: Normal breath sounds. No wheezing.  Chest:  Breasts:    Right: No mass.     Left: No mass.  Abdominal:     General: Bowel sounds are normal.     Palpations: Abdomen is soft.     Tenderness: There is no abdominal tenderness.  Musculoskeletal:        General: Normal range of motion.     Cervical back: Normal range of motion and neck supple.     Right lower leg: No edema.     Left lower leg: No edema.  Lymphadenopathy:     Cervical: No cervical adenopathy.  Skin:    General: Skin is warm and dry.     Capillary Refill: Capillary refill takes less than 2 seconds.  Neurological:     General: No focal deficit present.     Mental Status: He is alert and oriented to person, place, and time.     Deep Tendon Reflexes: Reflexes are normal and symmetric.  Psychiatric:        Attention and Perception: Attention normal.        Mood and Affect:  Mood normal.        Thought Content: Thought content normal.    Wt Readings from Last 3 Encounters:  07/04/21 178 lb (80.7 kg)   04/23/21 177 lb (80.3 kg)  02/23/21 174 lb (78.9 kg)    BP 134/62   Pulse 67   Ht 5' 8"  (1.727 m)   Wt 178 lb (80.7 kg)   SpO2 96%   BMI 27.06 kg/m   Assessment and Plan: 1. Annual physical exam Normal exam Up to date on screenings and immunizations. Continue healthy diet and weight control  2. Colon cancer screening Due for 10 yr colonoscopy - Ambulatory referral to Gastroenterology  3. Essential hypertension Clinically stable exam with well controlled BP. Tolerating medications without side effects at this time. Pt to continue current regimen and low sodium diet; benefits of regular exercise as able discussed. - Comprehensive metabolic panel - CBC with Differential/Platelet  4. DM (diabetes mellitus), type 2 with complications ([place]) Clinically stable by exam and report without s/s of hypoglycemia. DM complicated by hypertension and dyslipidemia. Tolerating medications well without side effects or other concerns. He is due for eye exam. - Comprehensive metabolic panel - Hemoglobin A1c - Microalbumin / creatinine urine ratio  5. Hyperlipidemia associated with type 2 diabetes mellitus ([place]) Tolerating statin medication without side effects at this time LDL is at goal of < 70 on current dose Continue same therapy without change at this time. - Lipid panel  6. Major depressive disorder, recurrent episode, moderate ([facility]) Clinically stable on current regimen with good control of symptoms, No SI or HI. Will continue current therapy.  7. B12 deficiency Check labs - Vitamin B12  8. Prostate cancer [facility]) S/p radical prostatectomy 01/2021 Followed by Urology   Partially dictated using [place]. Any errors are unintentional.  Halina Maidens, MD [facility]  07/04/2021

## 2021-07-04 NOTE — Patient Instructions (Signed)
Schedule annual eye exam for DM  Colonoscopy is due - [facility] will be calling to schedule.

## 2021-07-05 LAB — COMPREHENSIVE METABOLIC PANEL
ALT: 31 IU/L (ref 0–44)
AST: 28 IU/L (ref 0–40)
Albumin/Globulin Ratio: 1.9 (ref 1.2–2.2)
Albumin: 4.9 g/dL — ABNORMAL HIGH (ref 3.8–4.8)
Alkaline Phosphatase: 94 IU/L (ref 44–121)
BUN/Creatinine Ratio: 11 (ref 10–24)
BUN: 12 mg/dL (ref 8–27)
Bilirubin Total: 0.6 mg/dL (ref 0.0–1.2)
CO2: 23 mmol/L (ref 20–29)
Calcium: 10.2 mg/dL (ref 8.6–10.2)
Chloride: 99 mmol/L (ref 96–106)
Creatinine, Ser: 1.06 mg/dL (ref 0.76–1.27)
Globulin, Total: 2.6 g/dL (ref 1.5–4.5)
Glucose: 200 mg/dL — ABNORMAL HIGH (ref 70–99)
Potassium: 4.2 mmol/L (ref 3.5–5.2)
Sodium: 140 mmol/L (ref 134–144)
Total Protein: 7.5 g/dL (ref 6.0–8.5)
eGFR: 77 mL/min/{1.73_m2} (ref >59)

## 2021-07-05 LAB — CBC WITH DIFFERENTIAL/PLATELET
Basophils Absolute: 0 10*3/uL (ref 0.0–0.2)
Basos: 1 %
EOS (ABSOLUTE): 0.1 10*3/uL (ref 0.0–0.4)
Eos: 2 %
Hematocrit: 44.6 % (ref 37.5–51.0)
Hemoglobin: 15.8 g/dL (ref 13.0–17.7)
Immature Grans (Abs): 0 10*3/uL (ref 0.0–0.1)
Immature Granulocytes: 1 %
Lymphocytes Absolute: 1.9 10*3/uL (ref 0.7–3.1)
Lymphs: 32 %
MCH: 33.3 pg — ABNORMAL HIGH (ref 26.6–33.0)
MCHC: 35.4 g/dL (ref 31.5–35.7)
MCV: 94 fL (ref 79–97)
Monocytes Absolute: 0.5 10*3/uL (ref 0.1–0.9)
Monocytes: 8 %
Neutrophils Absolute: 3.5 10*3/uL (ref 1.4–7.0)
Neutrophils: 56 %
Platelets: 225 10*3/uL (ref 150–450)
RBC: 4.74 x10E6/uL (ref 4.14–5.80)
RDW: 12.7 % (ref 11.6–15.4)
WBC: 6.1 10*3/uL (ref 3.4–10.8)

## 2021-07-05 LAB — MICROALBUMIN / CREATININE URINE RATIO
Creatinine, Urine: 65.4 mg/dL
Microalb/Creat Ratio: 9 mg/g creat (ref 0–29)
Microalbumin, Urine: 5.7 ug/mL

## 2021-07-05 LAB — HEMOGLOBIN A1C
Est. average glucose Bld gHb Est-mCnc: 235 mg/dL
Hgb A1c MFr Bld: 9.8 % — ABNORMAL HIGH (ref 4.8–5.6)

## 2021-07-05 LAB — LIPID PANEL
Chol/HDL Ratio: 4.2 ratio (ref 0.0–5.0)
Cholesterol, Total: 166 mg/dL (ref 100–199)
HDL: 40 mg/dL (ref >39)
LDL Chol Calc (NIH): 87 mg/dL (ref 0–99)
Triglycerides: 234 mg/dL — ABNORMAL HIGH (ref 0–149)
VLDL Cholesterol Cal: 39 mg/dL (ref 5–40)

## 2021-07-05 LAB — VITAMIN B12: Vitamin B-12: 933 pg/mL (ref 232–1245)

## 2021-07-05 NOTE — Telephone Encounter (Signed)
Requested Prescriptions  Pending Prescriptions Disp Refills  . JARDIANCE 25 MG TABS tablet [Pharmacy Med Name: JARDIANCE 25MG TABLETS] 90 tablet 0    Sig: TAKE 1 TABLET BY MOUTH ONCE DAILY BEFORE BREAKFAST     Endocrinology:  Diabetes - SGLT2 Inhibitors Failed - 07/04/2021  5:27 AM      Failed - HBA1C is between 0 and 7.9 and within 180 days    Hgb A1c MFr Bld  Date Value Ref Range Status  04/23/2021 10.2 (H) 4.8 - 5.6 % Final    Comment:             Prediabetes: 5.7 - 6.4          Diabetes: >6.4          Glycemic control for adults with diabetes: <7.0          Passed - Cr in normal range and within 360 days    Creatinine, Ser  Date Value Ref Range Status  04/23/2021 1.07 0.76 - 1.27 mg/dL Final         Passed - eGFR in normal range and within 360 days    GFR calc Af Amer  Date Value Ref Range Status  06/16/2019 72 >59 mL/min/1.73 Final    Comment:    **[facility] currently reports eGFR in compliance with the current**   recommendations of the [employer]. [facility] will   update reporting as new guidelines are published from the NKF-ASN   Task force.    GFR, Estimated  Date Value Ref Range Status  01/16/2021 >60 >60 mL/min Final    Comment:    (NOTE) Calculated using the CKD-EPI Creatinine Equation (2021)    eGFR  Date Value Ref Range Status  04/23/2021 76 >59 mL/min/1.73 Final         Passed - Valid encounter within last 6 months    Recent Outpatient Visits          Yesterday Annual physical exam   [facility] Glean [facility], MD   2 months ago DM (diabetes mellitus), type 2 with complications [facility])   [facility] Glean Hess, MD   6 months ago DM (diabetes mellitus), type 2 with complications [facility])   [facility] Glean Hess, MD   8 months ago DM (diabetes mellitus), type 2 with complications [facility])   [facility] Glean Hess, MD   1 year ago Annual physical exam   [facility]  Glean Hess, MD      Future Appointments            In 1 month Hollice Espy, MD [place]   In 4 months Glean Hess, MD [facility], [facility]

## 2021-07-06 ENCOUNTER — Other Ambulatory Visit: Payer: Self-pay | Admitting: Internal Medicine

## 2021-07-06 ENCOUNTER — Telehealth: Payer: Self-pay

## 2021-07-06 NOTE — Telephone Encounter (Signed)
CALLED PATIENT NO ANSWER LEFT VOICEMAIL FOR A CALL BACK  letter sent 

## 2021-07-10 ENCOUNTER — Ambulatory Visit: Payer: Medicare Other | Admitting: Internal Medicine

## 2021-07-12 ENCOUNTER — Other Ambulatory Visit: Payer: Self-pay

## 2021-07-12 DIAGNOSIS — Z86010 Personal history of colonic polyps: Secondary | ICD-10-CM

## 2021-07-12 MED ORDER — PEG 3350-KCL-NA BICARB-NACL 420 G PO SOLR: 4000.0000 mL | Freq: Once | ORAL | 0 refills | Status: AC

## 2021-07-12 NOTE — Progress Notes (Signed)
Gastroenterology Pre-Procedure Review  Request Date: 08/02/2021 Requesting Physician: Dr. Marius Ditch  PATIENT REVIEW QUESTIONS: The patient responded to the following health history questions as indicated:    1. Are you having any GI issues? no 2. Do you have a personal history of Polyps? yes (last colonoscopy ) 3. Do you have a family history of Colon Cancer or Polyps? no 4. Diabetes Mellitus? yes (type 2) 5. Joint replacements in the past 12 months?no 6. Major health problems in the past 3 months?no 7. Any artificial heart valves, MVP, or defibrillator?no    MEDICATIONS & ALLERGIES:    Patient reports the following regarding taking any anticoagulation/antiplatelet therapy:   Plavix, Coumadin, Eliquis, Xarelto, Lovenox, Pradaxa, Brilinta, or Effient? no Aspirin? no  Patient confirms/reports the following medications:  Current Outpatient Medications  Medication Sig Dispense Refill   atorvastatin (LIPITOR) 40 MG tablet TAKE 1 TABLET BY MOUTH DAILY 90 tablet 1   Cholecalciferol (VITAMIN D3) 2000 units TABS Take 2,000 Units by mouth daily.     citalopram (CELEXA) 20 MG tablet Take 1 tablet (20 mg total) by mouth daily. 90 tablet 1   Cyanocobalamin (B-12) 500 MCG TABS Take 1 tablet by mouth daily. 150 tablet    docusate sodium (COLACE) 100 MG capsule Take 1 capsule (100 mg total) by mouth 2 (two) times daily. 10 capsule 0   glimepiride (AMARYL) 2 MG tablet TAKE 1 TABLET BY MOUTH EVERY DAY WITH BREAKFAST 90 tablet 0   JARDIANCE 25 MG TABS tablet TAKE 1 TABLET BY MOUTH ONCE DAILY BEFORE BREAKFAST 90 tablet 0   losartan-hydrochlorothiazide (HYZAAR) 100-25 MG tablet Take 1 tablet by mouth daily. 90 tablet 1   metFORMIN (GLUCOPHAGE) 500 MG tablet Take 2 tablets (1,000 mg total) by mouth 2 (two) times daily with a meal. 360 tablet 1   oxybutynin (DITROPAN) 5 MG tablet Take 1 tablet (5 mg total) by mouth every 8 (eight) hours as needed for bladder spasms. 30 tablet 0   oxyCODONE-acetaminophen  (PERCOCET/ROXICET) 5-325 MG tablet Take 1-2 tablets by mouth every 6 (six) hours as needed for moderate pain. 12 tablet 0   sildenafil (REVATIO) 20 MG tablet 2-5 tablets as needed one hour prior to intercourse. 30 tablet 3   No current facility-administered medications for this visit.    Patient confirms/reports the following allergies:  Allergies  Allergen Reactions   Codeine Other (See Comments)    Patient states "a long time ago, codeine made me dizzy."  He does not recollect which medication it was.     No orders of the defined types were placed in this encounter.   AUTHORIZATION INFORMATION Primary Insurance: 1D#: Group #:  Secondary Insurance: 1D#: Group #:  SCHEDULE INFORMATION: Date: 08/02/2021 Time: Location:[facility]

## 2021-07-13 ENCOUNTER — Other Ambulatory Visit: Payer: Self-pay | Admitting: Internal Medicine

## 2021-07-13 DIAGNOSIS — E118 Type 2 diabetes mellitus with unspecified complications: Secondary | ICD-10-CM

## 2021-07-13 NOTE — Telephone Encounter (Signed)
Duplicate request. Requested Prescriptions  Pending Prescriptions Disp Refills  . JARDIANCE 25 MG TABS tablet [Pharmacy Med Name: JARDIANCE 25MG TABLETS] 90 tablet 0    Sig: TAKE 1 TABLET BY MOUTH EVERY DAY BEFORE BREAKFAST     Endocrinology:  Diabetes - SGLT2 Inhibitors Failed - 07/13/2021  8:00 AM      Failed - HBA1C is between 0 and 7.9 and within 180 days    Hgb A1c MFr Bld  Date Value Ref Range Status  07/04/2021 9.8 (H) 4.8 - 5.6 % Final    Comment:             Prediabetes: 5.7 - 6.4          Diabetes: >6.4          Glycemic control for adults with diabetes: <7.0          Passed - Cr in normal range and within 360 days    Creatinine, Ser  Date Value Ref Range Status  07/04/2021 1.06 0.76 - 1.27 mg/dL Final         Passed - eGFR in normal range and within 360 days    GFR calc Af Amer  Date Value Ref Range Status  06/16/2019 72 >59 mL/min/1.73 Final    Comment:    **[facility] currently reports eGFR in compliance with the current**   recommendations of the [employer]. [facility] will   update reporting as new guidelines are published from the NKF-ASN   Task force.    GFR, Estimated  Date Value Ref Range Status  01/16/2021 >60 >60 mL/min Final    Comment:    (NOTE) Calculated using the CKD-EPI Creatinine Equation (2021)    eGFR  Date Value Ref Range Status  07/04/2021 77 >59 mL/min/1.73 Final         Passed - Valid encounter within last 6 months    Recent Outpatient Visits          1 week ago Annual physical exam   [facility] Glean Hess, MD   2 months ago DM (diabetes mellitus), type 2 with complications [facility])   [facility] Glean Hess, MD   6 months ago DM (diabetes mellitus), type 2 with complications [facility])   [facility] Glean Hess, MD   9 months ago DM (diabetes mellitus), type 2 with complications [facility])   [facility] Glean Hess, MD   1 year ago Annual physical exam   [facility] Glean Hess, MD      Future Appointments            In 4 days Glean Hess, MD [facility], [place]   In 1 month Hollice Espy, MD [place]   In 3 months Army Melia Jesse Sans, MD [facility], [facility]

## 2021-07-15 ENCOUNTER — Other Ambulatory Visit: Payer: Self-pay | Admitting: Internal Medicine

## 2021-07-15 DIAGNOSIS — I10 Essential (primary) hypertension: Secondary | ICD-10-CM

## 2021-07-16 ENCOUNTER — Other Ambulatory Visit: Payer: Self-pay | Admitting: Internal Medicine

## 2021-07-16 DIAGNOSIS — F331 Major depressive disorder, recurrent, moderate: Secondary | ICD-10-CM

## 2021-07-16 DIAGNOSIS — E1169 Type 2 diabetes mellitus with other specified complication: Secondary | ICD-10-CM

## 2021-07-16 DIAGNOSIS — E785 Hyperlipidemia, unspecified: Secondary | ICD-10-CM

## 2021-07-16 NOTE — Telephone Encounter (Signed)
Requested Prescriptions  Pending Prescriptions Disp Refills  . losartan-hydrochlorothiazide (HYZAAR) 100-25 MG tablet [Pharmacy Med Name: LOSARTAN-HCTZ 100-25 MG TAB] 90 tablet 1    Sig: TAKE 1 TABLET BY MOUTH EVERY DAY     Cardiovascular: ARB + Diuretic Combos Passed - 07/15/2021  9:29 AM      Passed - K in normal range and within 180 days    Potassium  Date Value Ref Range Status  07/04/2021 4.2 3.5 - 5.2 mmol/L Final         Passed - Na in normal range and within 180 days    Sodium  Date Value Ref Range Status  07/04/2021 140 134 - 144 mmol/L Final         Passed - Cr in normal range and within 180 days    Creatinine, Ser  Date Value Ref Range Status  07/04/2021 1.06 0.76 - 1.27 mg/dL Final         Passed - eGFR is 10 or above and within 180 days    GFR calc Af Amer  Date Value Ref Range Status  06/16/2019 72 >59 mL/min/1.73 Final    Comment:    **[facility] currently reports eGFR in compliance with the current**   recommendations of the [employer]. [facility] will   update reporting as new guidelines are published from the NKF-ASN   Task force.    GFR, Estimated  Date Value Ref Range Status  01/16/2021 >60 >60 mL/min Final    Comment:    (NOTE) Calculated using the CKD-EPI Creatinine Equation (2021)    eGFR  Date Value Ref Range Status  07/04/2021 77 >59 mL/min/1.73 Final         Passed - Patient is not pregnant      Passed - Last BP in normal range    BP Readings from Last 1 Encounters:  07/04/21 134/62         Passed - Valid encounter within last 6 months    Recent Outpatient Visits          1 week ago Annual physical exam   [facility] Glean Hess, MD   2 months ago DM (diabetes mellitus), type 2 with complications [facility])   [facility] Glean Hess, MD   6 months ago DM (diabetes mellitus), type 2 with complications [facility])   [facility] Glean Hess, MD   9 months ago DM (diabetes mellitus),  type 2 with complications [facility])   [facility] Glean Hess, MD   1 year ago Annual physical exam   [facility] Glean Hess, MD      Future Appointments            Tomorrow Glean Hess, MD [facility], [place]   In 1 month Hollice Espy, MD [place]   In 3 months Glean Hess, MD [facility], [facility]

## 2021-07-17 ENCOUNTER — Ambulatory Visit (INDEPENDENT_AMBULATORY_CARE_PROVIDER_SITE_OTHER): Payer: Medicare Other | Admitting: Internal Medicine

## 2021-07-17 ENCOUNTER — Encounter: Payer: Self-pay | Admitting: Internal Medicine

## 2021-07-17 VITALS — BP 126/66 | HR 66 | Ht 68.0 in | Wt 178.0 lb

## 2021-07-17 DIAGNOSIS — E118 Type 2 diabetes mellitus with unspecified complications: Secondary | ICD-10-CM

## 2021-07-17 MED ORDER — TRULICITY 0.75 MG/0.5ML ~~LOC~~ SOAJ: 0.7500 mg | SUBCUTANEOUS | 1 refills | Status: DC

## 2021-07-17 NOTE — Telephone Encounter (Signed)
Refused Lipitor 40 mg and Celexa 20 mg because these were sent to [facility] and this request is from [facility].   Also being requested too early.

## 2021-07-17 NOTE — Progress Notes (Signed)
trul   Date:  07/17/2021   Name:  Calvin Sanders   DOB:  [birth year]   MRN:  [id number]   Chief Complaint: Diabetes  HPI DM not controlled on three agents.  He is following a fairly good diet.  Plan today is to instruct in Trulicity and start with samples.  Lab Results  Component Value Date   NA 140 07/04/2021   K 4.2 07/04/2021   CO2 23 07/04/2021   GLUCOSE 200 (H) 07/04/2021   BUN 12 07/04/2021   CREATININE 1.06 07/04/2021   CALCIUM 10.2 07/04/2021   EGFR 77 07/04/2021   GFRNONAA >60 01/16/2021   Lab Results  Component Value Date   CHOL 166 07/04/2021   HDL 40 07/04/2021   LDLCALC 87 07/04/2021   TRIG 234 (H) 07/04/2021   CHOLHDL 4.2 07/04/2021   Lab Results  Component Value Date   TSH 1.560 06/21/2020   Lab Results  Component Value Date   HGBA1C 9.8 (H) 07/04/2021   Lab Results  Component Value Date   WBC 6.1 07/04/2021   HGB 15.8 07/04/2021   HCT 44.6 07/04/2021   MCV 94 07/04/2021   PLT 225 07/04/2021   Lab Results  Component Value Date   ALT 31 07/04/2021   AST 28 07/04/2021   ALKPHOS 94 07/04/2021   BILITOT 0.6 07/04/2021   Lab Results  Component Value Date   VD25OH 67.0 06/16/2019     Review of Systems  Constitutional:  Negative for chills and fatigue.  Respiratory:  Negative for chest tightness and shortness of breath.   Cardiovascular:  Negative for chest pain and palpitations.  Neurological:  Negative for dizziness and headaches.   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     Past Surgical History:  Procedure Laterality Date   PELVIC LYMPH NODE DISSECTION Bilateral 01/15/2021   Procedure: PELVIC LYMPH NODE DISSECTION;  Surgeon: Hollice Espy, MD;  Location: [facility] ORS;  Service: Urology;  Laterality: Bilateral;   PROSTATE BIOPSY N/A 10/24/2020   Procedure: PROSTATE BIOPSY;  Surgeon: Abbie Sons, MD;  Location: [facility] ORS;  Service: Urology;  Laterality: N/A;   ROBOT ASSISTED LAPAROSCOPIC RADICAL PROSTATECTOMY N/A 01/15/2021   Procedure: XI ROBOTIC ASSISTED LAPAROSCOPIC RADICAL PROSTATECTOMY;  Surgeon: Hollice Espy, MD;  Location: [facility] ORS;  Service: Urology;  Laterality: N/A;   TRANSRECTAL ULTRASOUND N/A 10/24/2020   Procedure: TRANSRECTAL ULTRASOUND;  Surgeon: Abbie Sons, MD;  Location: [facility] ORS;  Service: Urology;  Laterality: N/A;  Confirmed with Kim in Ultrasound    Social History   Tobacco Use   Smoking status: Former    Packs/day: 0.25    Years: 45.00    Pack years: 11.25    Types: Cigarettes    Start date: 02/11/1973  Quit date: 09/11/2020    Years since quitting: 0.8   Smokeless tobacco: Never   Tobacco comments:    3 a day   Vaping Use   Vaping Use: Never used  Substance Use Topics   Alcohol use: Yes    Alcohol/week: 14.0 standard drinks    Types: 14 Cans of beer per week    Comment: back up to 1-2 beers per day   Drug use: No     Medication list has been reviewed and updated.  Current Meds  Medication Sig   atorvastatin (LIPITOR) 40 MG tablet TAKE 1 TABLET BY MOUTH DAILY   Cholecalciferol (VITAMIN D3) 2000 units TABS Take 2,000 Units by mouth daily.   citalopram (CELEXA) 20 MG tablet Take 1 tablet (20 mg total) by  mouth daily.   Cyanocobalamin (B-12) 500 MCG TABS Take 1 tablet by mouth daily.   docusate sodium (COLACE) 100 MG capsule Take 1 capsule (100 mg total) by mouth 2 (two) times daily.   Dulaglutide (TRULICITY) 6.96 EX/5.2WU SOPN Inject 0.75 mg into the skin once a week.   glimepiride (AMARYL) 2 MG tablet TAKE 1 TABLET BY MOUTH EVERY DAY WITH BREAKFAST   JARDIANCE 25 MG TABS tablet TAKE 1 TABLET BY MOUTH ONCE DAILY BEFORE BREAKFAST   losartan-hydrochlorothiazide (HYZAAR) 100-25 MG tablet TAKE 1 TABLET BY MOUTH EVERY DAY   metFORMIN (GLUCOPHAGE) 500 MG tablet Take 2 tablets (1,000 mg total) by mouth 2 (two) times daily with a meal.   oxybutynin (DITROPAN) 5 MG tablet Take 1 tablet (5 mg total) by mouth every 8 (eight) hours as needed for bladder spasms.   oxyCODONE-acetaminophen (PERCOCET/ROXICET) 5-325 MG tablet Take 1-2 tablets by mouth every 6 (six) hours as needed for moderate pain.   sildenafil (REVATIO) 20 MG tablet 2-5 tablets as needed one hour prior to intercourse.       07/04/2021    9:27 AM 04/23/2021    9:43 AM 12/25/2020    1:17 PM 10/13/2020    3:26 PM  GAD 7 : Generalized Anxiety Score  Nervous, Anxious, on Edge 0 0 0 0  Control/stop worrying 0 0 0 0  Worry too much - different things 0 0 0 0  Trouble relaxing 0 0 0 0  Restless 0 0 0 0  Easily annoyed or irritable 0 0 0 0  Afraid - awful might happen 0 0 0 0  Total GAD 7 Score 0 0 0 0  Anxiety Difficulty Not difficult at all Not difficult at all Not difficult at all        07/04/2021    9:27 AM  Depression screen PHQ 2/9  Decreased Interest 0  Down, Depressed, Hopeless 0  PHQ - 2 Score 0  Altered sleeping 0  Tired, decreased energy 0  Change in appetite 0  Feeling bad or failure about yourself  0  Trouble concentrating 0  Moving slowly or fidgety/restless 0  Suicidal thoughts 0  PHQ-9 Score 0  Difficult doing work/chores Not difficult at all    BP Readings from Last 3 Encounters:  07/17/21 126/66  07/04/21  134/62  04/23/21 140/78    Physical Exam Vitals and nursing note reviewed.  Constitutional:      General: He is not in acute distress.    Appearance: He is well-developed.  HENT:     Head: Normocephalic and atraumatic.  Cardiovascular:     Rate and Rhythm: Normal rate and regular rhythm.  Pulmonary:     Effort: Pulmonary  effort is normal. No respiratory distress.     Breath sounds: No wheezing or rhonchi.  Skin:    General: Skin is warm and dry.     Findings: No rash.  Neurological:     Mental Status: He is alert and oriented to person, place, and time.  Psychiatric:        Mood and Affect: Mood normal.        Behavior: Behavior normal.    Wt Readings from Last 3 Encounters:  07/17/21 178 lb (80.7 kg)  07/04/21 178 lb (80.7 kg)  04/23/21 177 lb (80.3 kg)    BP 126/66   Pulse 66   Ht 5' 8" (1.727 m)   Wt 178 lb (80.7 kg)   BMI 27.06 kg/m   Assessment and Plan: 1. DM (diabetes mellitus), type 2 with complications ([place]) Not controlled on Amaryl, Jardiance and metformin. Instructed patient and wife with Trulicity.  Patient demonstrated correct use. Sample x 2 weeks given. - Dulaglutide (TRULICITY) 5.46 TK/3.5WS SOPN; Inject 0.75 mg into the skin once a week.  Dispense: 9 mL; Refill: 1   Partially dictated using Editor, commissioning. Any errors are unintentional.  Halina Maidens, MD [facility]  07/17/2021

## 2021-07-18 LAB — HM DIABETES EYE EXAM

## 2021-07-25 ENCOUNTER — Other Ambulatory Visit: Payer: Self-pay | Admitting: Internal Medicine

## 2021-07-25 ENCOUNTER — Encounter: Payer: Self-pay | Admitting: Gastroenterology

## 2021-07-25 DIAGNOSIS — F331 Major depressive disorder, recurrent, moderate: Secondary | ICD-10-CM

## 2021-07-25 NOTE — Telephone Encounter (Signed)
Patient called on home number and advised a [facility] was sent and the medication was actually sent to [facility] 04/23/21 #90/1 refill. He says his wife is on the phone now with [place]. Advised I will refuse this request from [facility] and if [facility] doesn't have the medication to call us back and we will have [facility], patient verbalized understanding.    Requested Prescriptions  Pending Prescriptions Disp Refills   citalopram (CELEXA) 20 MG tablet [Pharmacy Med Name: CITALOPRAM HBR 20 MG TABLET] 90 tablet 1    Sig: TAKE 1 TABLET BY MOUTH EVERY DAY     Psychiatry:  Antidepressants - SSRI Passed - 07/25/2021  4:39 PM      Passed - Completed PHQ-2 or PHQ-9 in the last 360 days      Passed - Valid encounter within last 6 months    Recent Outpatient Visits           1 week ago DM (diabetes mellitus), type 2 with complications [facility])   [facility] Glean Hess, MD   3 weeks ago Annual physical exam   [facility] Glean Hess, MD   3 months ago DM (diabetes mellitus), type 2 with complications [facility])   [facility] Glean Hess, MD   7 months ago DM (diabetes mellitus), type 2 with complications [facility])   [facility] Glean Hess, MD   9 months ago DM (diabetes mellitus), type 2 with complications [facility])   [place], Laura H, MD       Future Appointments             In 1 month Hollice Espy, MD [place]   In 3 months Glean Hess, MD [facility], [facility]

## 2021-08-02 ENCOUNTER — Other Ambulatory Visit: Payer: Self-pay

## 2021-08-02 ENCOUNTER — Ambulatory Visit: Payer: Medicare Other | Admitting: Anesthesiology

## 2021-08-02 ENCOUNTER — Encounter: Payer: Self-pay | Admitting: Gastroenterology

## 2021-08-02 ENCOUNTER — Encounter
Admission: RE | Disposition: A | Discharge status: Home / Self Care | Payer: Self-pay | Source: Ambulatory Visit | Attending: Gastroenterology

## 2021-08-02 ENCOUNTER — Ambulatory Visit
Admission: RE | Admit: 2021-08-02 | Discharge: 2021-08-02 | Disposition: A | Discharge status: Home / Self Care | Payer: Medicare Other | Source: Ambulatory Visit | Attending: Gastroenterology | Admitting: Gastroenterology

## 2021-08-02 DIAGNOSIS — Z860100 Personal history of colon polyps, unspecified: Secondary | ICD-10-CM

## 2021-08-02 DIAGNOSIS — Z8546 Personal history of malignant neoplasm of prostate: Secondary | ICD-10-CM | POA: Diagnosis not present

## 2021-08-02 DIAGNOSIS — E119 Type 2 diabetes mellitus without complications: Secondary | ICD-10-CM | POA: Diagnosis not present

## 2021-08-02 DIAGNOSIS — F32A Depression, unspecified: Secondary | ICD-10-CM | POA: Diagnosis not present

## 2021-08-02 DIAGNOSIS — I10 Essential (primary) hypertension: Secondary | ICD-10-CM | POA: Diagnosis not present

## 2021-08-02 DIAGNOSIS — Z87891 Personal history of nicotine dependence: Secondary | ICD-10-CM | POA: Diagnosis not present

## 2021-08-02 DIAGNOSIS — K621 Rectal polyp: Secondary | ICD-10-CM | POA: Insufficient documentation

## 2021-08-02 DIAGNOSIS — Z7984 Long term (current) use of oral hypoglycemic drugs: Secondary | ICD-10-CM | POA: Insufficient documentation

## 2021-08-02 DIAGNOSIS — Z1211 Encounter for screening for malignant neoplasm of colon: Secondary | ICD-10-CM | POA: Insufficient documentation

## 2021-08-02 DIAGNOSIS — K635 Polyp of colon: Secondary | ICD-10-CM | POA: Diagnosis not present

## 2021-08-02 HISTORY — PX: COLONOSCOPY WITH PROPOFOL: SHX5780

## 2021-08-02 HISTORY — PX: POLYPECTOMY: SHX5525

## 2021-08-02 LAB — GLUCOSE, CAPILLARY
Glucose-Capillary: 133 mg/dL — ABNORMAL HIGH (ref 70–99)
Glucose-Capillary: 105 mg/dL — ABNORMAL HIGH (ref 70–99)

## 2021-08-02 SURGERY — COLONOSCOPY WITH PROPOFOL
Anesthesia: General | Site: Rectum

## 2021-08-02 MED ORDER — PROPOFOL 10 MG/ML IV BOLUS
INTRAVENOUS | Status: DC | PRN
  Administered 2021-08-02 (×7): 20 mg via INTRAVENOUS
  Administered 2021-08-02: 100 mg via INTRAVENOUS
  Administered 2021-08-02 (×5): 20 mg via INTRAVENOUS

## 2021-08-02 MED ORDER — LIDOCAINE HCL (CARDIAC) PF 100 MG/5ML IV SOSY
PREFILLED_SYRINGE | INTRAVENOUS | Status: DC | PRN
  Administered 2021-08-02: 30 mg via INTRAVENOUS

## 2021-08-02 MED ORDER — SODIUM CHLORIDE 0.9 % IV SOLN: INTRAVENOUS | Status: DC

## 2021-08-02 MED ORDER — STERILE WATER FOR IRRIGATION IR SOLN
Status: DC | PRN
  Administered 2021-08-02: 150 mL

## 2021-08-02 MED ORDER — LACTATED RINGERS IV SOLN: INTRAVENOUS | Status: DC

## 2021-08-02 SURGICAL SUPPLY — 26 items
CLIP HMST 235XBRD CATH ROT (MISCELLANEOUS) IMPLANT
CLIP RESOLUTION 360 11X235 (MISCELLANEOUS)
ELECT REM PT RETURN 9FT ADLT (ELECTROSURGICAL)
ELECTRODE REM PT RTRN 9FT ADLT (ELECTROSURGICAL) IMPLANT
FCP ESCP3.2XJMB 240X2.8X (MISCELLANEOUS)
FORCEPS BIOP RAD 4 LRG CAP 4 (CUTTING FORCEPS) IMPLANT
FORCEPS BIOP RJ4 240 W/NDL (MISCELLANEOUS)
FORCEPS ESCP3.2XJMB 240X2.8X (MISCELLANEOUS) IMPLANT
GOWN CVR UNV OPN BCK APRN NK (MISCELLANEOUS) ×4 IMPLANT
GOWN ISOL THUMB LOOP REG UNIV (MISCELLANEOUS) ×6
INJECTOR VARIJECT VIN23 (MISCELLANEOUS) IMPLANT
KIT DEFENDO VALVE AND CONN (KITS) IMPLANT
KIT PRC NS LF DISP ENDO (KITS) ×2 IMPLANT
KIT PROCEDURE OLYMPUS (KITS) ×3
MANIFOLD NEPTUNE II (INSTRUMENTS) ×3 IMPLANT
MARKER SPOT ENDO TATTOO 5ML (MISCELLANEOUS) IMPLANT
PROBE APC STR FIRE (PROBE) IMPLANT
RETRIEVER NET ROTH 2.5X230 LF (MISCELLANEOUS) IMPLANT
SNARE COLD EXACTO (MISCELLANEOUS) ×1 IMPLANT
SNARE SHORT THROW 13M SML OVAL (MISCELLANEOUS) IMPLANT
SNARE SHORT THROW 30M LRG OVAL (MISCELLANEOUS) IMPLANT
SNARE SNG USE RND 15MM (INSTRUMENTS) IMPLANT
SPOT EX ENDOSCOPIC TATTOO (MISCELLANEOUS)
TRAP ETRAP POLY (MISCELLANEOUS) ×1 IMPLANT
VARIJECT INJECTOR VIN23 (MISCELLANEOUS)
WATER STERILE IRR 250ML POUR (IV SOLUTION) ×3 IMPLANT

## 2021-08-02 NOTE — Op Note (Signed)
[facility] Patient Name: Calvin Sanders Procedure Date: 08/02/2021 8:50 AM MRN: [id number] Account #: [id number] Date of Birth: [birth year] Admit Type: Outpatient Age: 66 Room: [facility] OR ROOM 01 Gender: Male Note Status: [place] Instrument Name: [id number] Procedure:             Colonoscopy Indications:           Screening for colorectal malignant neoplasm, Last                         colonoscopy 10 years ago Providers:             Lin Landsman MD, MD Referring MD:          Halina Maidens, MD (Referring MD) Medicines:             General Anesthesia Complications:         No immediate complications. Estimated blood loss:                         Minimal. Procedure:             Pre-Anesthesia Assessment:                        - Prior to the procedure, a History and Physical was                         performed, and patient medications and allergies were                         reviewed. The patient is competent. The risks and                         benefits of the procedure and the sedation options and                         risks were discussed with the patient. All questions                         were answered and informed consent was obtained.                         Patient identification and proposed procedure were                         verified by the physician, the nurse, the                         anesthesiologist, the anesthetist and the technician                         in the pre-procedure area in the procedure room in the                         endoscopy suite. Mental Status Examination: alert and                         oriented. Airway Examination: normal oropharyngeal  airway and neck mobility. Respiratory Examination:                         clear to auscultation. CV Examination: normal.                         Prophylactic Antibiotics: The patient does not require                          prophylactic antibiotics. Prior Anticoagulants: The                         patient has taken no previous anticoagulant or                         antiplatelet agents. ASA Grade Assessment: II - A                         patient with mild systemic disease. After reviewing                         the risks and benefits, the patient was deemed in                         satisfactory condition to undergo the procedure. The                         anesthesia plan was to use general anesthesia.                         Immediately prior to administration of medications,                         the patient was re-assessed for adequacy to receive                         sedatives. The heart rate, respiratory rate, oxygen                         saturations, blood pressure, adequacy of pulmonary                         ventilation, and response to care were monitored                         throughout the procedure. The physical status of the                         patient was re-assessed after the procedure.                        After obtaining informed consent, the colonoscope was                         passed under direct vision. Throughout the procedure,                         the patient's blood pressure, pulse, and oxygen  saturations were monitored continuously. The                         Colonoscope was introduced through the anus and                         advanced to the the cecum, identified by appendiceal                         orifice and ileocecal valve. The colonoscopy was                         performed without difficulty. The patient tolerated                         the procedure well. The quality of the bowel                         preparation was evaluated using the BBPS [facility] Bowel                         Preparation Scale) with scores of: Right Colon = 3,                         Transverse Colon = 3 and Left Colon = 3 (entire mucosa                          seen well with no residual staining, small fragments                         of stool or opaque liquid). The total BBPS score                         equals 9. Findings:      The perianal and digital rectal examinations were normal. Pertinent       negatives include normal sphincter tone and no palpable rectal lesions.      Four sessile polyps were found in the rectum and sigmoid colon. The       polyps were 3 to 6 mm in size. These polyps were removed with a cold       snare. Resection and retrieval were complete. Estimated blood loss was       minimal.      The retroflexed view of the distal rectum and anal verge was normal and       showed no anal or rectal abnormalities. Impression:            - Four 3 to 6 mm polyps in the rectum 3 and in the                         sigmoid colon 1, removed with a cold snare. Resected                         and retrieved.                        - The distal rectum and anal verge are normal on  retroflexion view. Recommendation:        - Discharge patient to home (with escort).                        - Resume previous diet today.                        - Continue present medications.                        - Await pathology results.                        - Repeat colonoscopy in 5 to 7 years for surveillance                         of multiple polyps. Procedure Code(s):     --- Professional ---                        [id number], Colonoscopy, flexible; with removal of                         tumor(s), polyp(s), or other lesion(s) by snare                         technique Diagnosis Code(s):     --- Professional ---                        Z12.11, Encounter for screening for malignant neoplasm                         of colon                        K62.1, Rectal polyp                        K63.5, Polyp of colon CPT copyright 2019 American Medical Association. All rights reserved. The codes documented in this report  are preliminary and upon coder review Calvin  be revised to meet current compliance requirements. Dr. Ulyess Mort Lin Landsman MD, MD 08/02/2021 9:31:24 AM This report has been signed electronically. Number of Addenda: 0 Note Initiated On: 08/02/2021 8:50 AM Scope Withdrawal Time: 0 hours 20 minutes 24 seconds  Total Procedure Duration: 0 hours 25 minutes 42 seconds  Estimated Blood Loss:  Estimated blood loss was minimal.      [facility]

## 2021-08-02 NOTE — Anesthesia Postprocedure Evaluation (Signed)
Anesthesia Post Note  Patient: Calvin Sanders  Procedure(s) Performed: COLONOSCOPY WITH PROPOFOL (Rectum) POLYPECTOMY (Rectum)     Patient location during evaluation: PACU Anesthesia Type: General Level of consciousness: awake Pain management: pain level controlled Vital Signs Assessment: post-procedure vital signs reviewed and stable Respiratory status: respiratory function stable Cardiovascular status: stable Postop Assessment: no signs of nausea or vomiting Anesthetic complications: no   No notable events documented.  Veda Canning

## 2021-08-02 NOTE — Anesthesia Preprocedure Evaluation (Signed)
Anesthesia Evaluation  Patient identified by MRN, date of birth, ID band Patient awake    Reviewed: Allergy & Precautions, NPO status   Airway Mallampati: II  TM Distance: >3 FB     Dental   Pulmonary former smoker,    Pulmonary exam normal        Cardiovascular hypertension,  Rhythm:Regular Rate:Normal     Neuro/Psych PSYCHIATRIC DISORDERS Depression    GI/Hepatic GERD  ,  Endo/Other  diabetes, Type 2  Renal/GU      Musculoskeletal   Abdominal   Peds  Hematology   Anesthesia Other Findings Prostate CA Hx ETOH dependence  Reproductive/Obstetrics                             Anesthesia Physical Anesthesia Plan  ASA: 2  Anesthesia Plan: General   Post-op Pain Management:    Induction: Intravenous  PONV Risk Score and Plan: Propofol infusion, TIVA and Treatment may vary due to age or medical condition  Airway Management Planned: Natural Airway and Nasal Cannula  Additional Equipment:   Intra-op Plan:   Post-operative Plan:   Informed Consent: I have reviewed the patients History and Physical, chart, labs and discussed the procedure including the risks, benefits and alternatives for the proposed anesthesia with the patient or authorized representative who has indicated his/her understanding and acceptance.       Plan Discussed with: CRNA  Anesthesia Plan Comments:         Anesthesia Quick Evaluation

## 2021-08-02 NOTE — H&P (Signed)
Cephas Darby, MD [address]  [place]  [place], [place] [address]  Main: [phone]  Fax: [phone] Pager: [phone]  Primary Care Physician:  Glean Hess, MD Primary Gastroenterologist:  Dr. Cephas Darby  Pre-Procedure History & Physical: HPI:  Calvin Sanders is a 67 y.o. male is here for an colonoscopy.   Past Medical History:  Diagnosis Date   Alcohol dependence, daily use ([place]) 08/05/2014   Previously 6-8 beers per day, cut back in 2020 to 2 per day; in 09/2018 he is only drinking 2 beers on Sat and Sun   Chronic prostatitis 05/14/2014   Diabetes mellitus without complication ([place])    type 2   GERD (gastroesophageal reflux disease)    Hypertension    Pneumonia    Prostate cancer ([place]) 10/2020    Past Surgical History:  Procedure Laterality Date   PELVIC LYMPH NODE DISSECTION Bilateral 01/15/2021   Procedure: PELVIC LYMPH NODE DISSECTION;  Surgeon: Hollice Espy, MD;  Location: [facility];  Service: Urology;  Laterality: Bilateral;   PROSTATE BIOPSY N/A 10/24/2020   Procedure: PROSTATE BIOPSY;  Surgeon: Abbie Sons, MD;  Location: [facility];  Service: Urology;  Laterality: N/A;   ROBOT ASSISTED LAPAROSCOPIC RADICAL PROSTATECTOMY N/A 01/15/2021   Procedure: XI ROBOTIC ASSISTED LAPAROSCOPIC RADICAL PROSTATECTOMY;  Surgeon: Hollice Espy, MD;  Location: [facility];  Service: Urology;  Laterality: N/A;   TRANSRECTAL ULTRASOUND N/A 10/24/2020   Procedure: TRANSRECTAL ULTRASOUND;  Surgeon: Abbie Sons, MD;  Location: [facility];  Service: Urology;  Laterality: N/A;  Confirmed with Kim in Ultrasound    Prior to Admission medications   Medication Sig Start Date End Date Taking? Authorizing Provider  atorvastatin (LIPITOR) 40 MG tablet TAKE 1 TABLET BY MOUTH DAILY 06/11/21  Yes Glean Hess, MD  Cholecalciferol (VITAMIN D3) 2000 units TABS Take 2,000 Units by mouth daily.   Yes [provider]  citalopram (CELEXA) 20 MG tablet Take 1  tablet (20 mg total) by mouth daily. 04/23/21  Yes Glean Hess, MD  Cyanocobalamin (B-12) 500 MCG TABS Take 1 tablet by mouth daily. 03/20/17  Yes [place], Gwyndolyn Saxon, MD  Dulaglutide (TRULICITY) 2.69 SW/5.4OE SOPN Inject 0.75 mg into the skin once a week. 07/17/21  Yes Glean Hess, MD  glimepiride (AMARYL) 2 MG tablet TAKE 1 TABLET BY MOUTH EVERY DAY WITH BREAKFAST 06/11/21  Yes Glean Hess, MD  JARDIANCE 25 MG TABS tablet TAKE 1 TABLET BY MOUTH ONCE DAILY BEFORE BREAKFAST 07/05/21  Yes Glean Hess, MD  losartan-hydrochlorothiazide (HYZAAR) 100-25 MG tablet TAKE 1 TABLET BY MOUTH EVERY DAY 07/16/21  Yes Glean Hess, MD  metFORMIN (GLUCOPHAGE) 500 MG tablet Take 2 tablets (1,000 mg total) by mouth 2 (two) times daily with a meal. 04/23/21  Yes Glean Hess, MD  sildenafil (REVATIO) 20 MG tablet 2-5 tablets as needed one hour prior to intercourse. 02/23/21  Yes Hollice Espy, MD  oxybutynin (DITROPAN) 5 MG tablet Take 1 tablet (5 mg total) by mouth every 8 (eight) hours as needed for bladder spasms. Patient not taking: Reported on 07/25/2021 01/16/21   Debroah Loop, PA-C    Allergies as of 07/12/2021 - Review Complete 07/12/2021  Allergen Reaction Noted   Codeine Other (See Comments) 01/15/2021    Family History  Problem Relation Age of Onset   Diabetes Mother    Cancer Father        bone   Heart disease Brother    Prostate cancer Brother  Kidney cancer Neg Hx    Kidney disease Neg Hx    Bladder Cancer Neg Hx     Social History   Socioeconomic History   Marital status: Married    Spouse name: Joelene Millin   Number of children: 2   Years of education: Not on file   Highest education level: Not on file  Occupational History   Not on file  Tobacco Use   Smoking status: Former    Packs/day: 0.25    Years: 45.00    Total pack years: 11.25    Types: Cigarettes    Start date: 02/11/1973    Quit date: 03/14/2021    Years since quitting: 0.3   Smokeless  tobacco: Never   Tobacco comments:    3 a day   Vaping Use   Vaping Use: Never used  Substance and Sexual Activity   Alcohol use: Yes    Alcohol/week: 14.0 standard drinks of alcohol    Types: 14 Cans of beer per week    Comment: back up to 1-2 beers per day   Drug use: No   Sexual activity: Yes    Partners: Female  Other Topics Concern   Not on file  Social History Narrative   Not on file   Social Determinants of Health   Financial Resource Strain: Low Risk  (11/20/2020)   Overall Financial Resource Strain (CARDIA)    Difficulty of Paying Living Expenses: Not hard at all  Food Insecurity: No Food Insecurity (11/20/2020)   Hunger Vital Sign    Worried About Running Out of Food in the Last Year: Never true    Ran Out of Food in the Last Year: Never true  Transportation Needs: No Transportation Needs (11/20/2020)   PRAPARE - Hydrologist (Medical): No    Lack of Transportation (Non-Medical): No  Physical Activity: Inactive (11/20/2020)   Exercise Vital Sign    Days of Exercise per Week: 0 days    Minutes of Exercise per Session: 0 min  Stress: No Stress Concern Present (11/20/2020)   Natural Steps    Feeling of Stress : Not at all  Social Connections: Moderately Isolated (11/20/2020)   Social Connection and Isolation Panel [NHANES]    Frequency of Communication with Friends and Family: More than three times a week    Frequency of Social Gatherings with Friends and Family: Once a week    Attends Religious Services: Never    Marine scientist or Organizations: No    Attends Archivist Meetings: Never    Marital Status: Married  Human resources officer Violence: Not At Risk (11/20/2020)   Humiliation, Afraid, Rape, and Kick questionnaire    Fear of Current or Ex-Partner: No    Emotionally Abused: No    Physically Abused: No    Sexually Abused: No    Review of  Systems: See HPI, otherwise negative ROS  Physical Exam: BP 139/71   Pulse 61   Temp 97.9 F (36.6 C) (Temporal)   Ht '5\' 8"'$  (1.727 m)   Wt 81.1 kg   SpO2 95%   BMI 27.19 kg/m  General:   Alert,  pleasant and cooperative in NAD Head:  Normocephalic and atraumatic. Neck:  Supple; no masses or thyromegaly. Lungs:  Clear throughout to auscultation.    Heart:  Regular rate and rhythm. Abdomen:  Soft, nontender and nondistended. Normal bowel sounds, without guarding, and without rebound.  Neurologic:  Alert and  oriented x4;  grossly normal neurologically.  Impression/Plan: Calvin Sanders is here for an colonoscopy to be performed for colon cancer screening  Risks, benefits, limitations, and alternatives regarding  colonoscopy have been reviewed with the patient.  Questions have been answered.  All parties agreeable.   Sherri Sear, MD  08/02/2021, 8:21 AM

## 2021-08-02 NOTE — Anesthesia Procedure Notes (Signed)
Date/Time: 08/02/2021 8:59 AM  Performed by: Cameron Ali, CRNAPre-anesthesia Checklist: Patient identified, Emergency Drugs available, Suction available, Timeout performed and Patient being monitored Patient Re-evaluated:Patient Re-evaluated prior to induction Oxygen Delivery Method: Nasal cannula Placement Confirmation: positive ETCO2

## 2021-08-02 NOTE — Transfer of Care (Signed)
Immediate Anesthesia Transfer of Care Note  Patient: Calvin Sanders  Procedure(s) Performed: COLONOSCOPY WITH PROPOFOL (Rectum) POLYPECTOMY (Rectum)  Patient Location: PACU  Anesthesia Type: General  Level of Consciousness: awake, alert  and patient cooperative  Airway and Oxygen Therapy: Patient Spontanous Breathing and Patient connected to supplemental oxygen  Post-op Assessment: Post-op Vital signs reviewed, Patient's Cardiovascular Status Stable, Respiratory Function Stable, Patent Airway and No signs of Nausea or vomiting  Post-op Vital Signs: Reviewed and stable  Complications: No notable events documented.

## 2021-08-03 ENCOUNTER — Encounter: Payer: Self-pay | Admitting: Gastroenterology

## 2021-08-06 LAB — SURGICAL PATHOLOGY

## 2021-08-07 ENCOUNTER — Encounter: Payer: Self-pay | Admitting: Gastroenterology

## 2021-08-13 ENCOUNTER — Telehealth: Payer: Self-pay | Admitting: Internal Medicine

## 2021-08-13 NOTE — Telephone Encounter (Signed)
Copied from [place] [id number]. Topic: General - Inquiry >> Aug 13, 2021  4:08 PM Chapman Fitch wrote: Reason for CRM: Pts wife called about a bill that was coded wrong/ DOS was 07-11-21/ the appt was a CPE but medicare stated it was coded as a cpe so they didn't pay anything on it / please advise

## 2021-08-17 NOTE — Telephone Encounter (Signed)
Looks as though he has medicare A and B as primary and [employer] a secondary.

## 2021-08-23 ENCOUNTER — Other Ambulatory Visit
Admission: RE | Admit: 2021-08-23 | Discharge: 2021-08-23 | Disposition: A | Discharge status: Home / Self Care | Payer: Medicare Other | Attending: Urology | Admitting: Urology

## 2021-08-23 DIAGNOSIS — C61 Malignant neoplasm of prostate: Secondary | ICD-10-CM | POA: Diagnosis present

## 2021-08-23 DIAGNOSIS — N393 Stress incontinence (female) (male): Secondary | ICD-10-CM | POA: Diagnosis present

## 2021-08-23 LAB — PSA: Prostatic Specific Antigen: 0.02 ng/mL (ref 0.00–4.00)

## 2021-08-30 ENCOUNTER — Other Ambulatory Visit: Payer: Self-pay | Admitting: Internal Medicine

## 2021-08-30 DIAGNOSIS — E1169 Type 2 diabetes mellitus with other specified complication: Secondary | ICD-10-CM

## 2021-08-30 NOTE — Progress Notes (Signed)
08/31/21 12:15 PM   Calvin Sanders [birth year] [id number]  Referring provider:  Glean Hess, MD [address] [place],  [place] [address] Chief Complaint  Patient presents with   Prostate Cancer      HPI: Calvin Sanders is a 66 y.o.male with a personal history of prostate cancer, stress incontinence, and erectile dysfunction of organic origin, who presents today for a  month follow-up with PSA prior.   He has history of abnormal rectal exam with abnormal PSA velocity. His prostate volume is 47g.  He was diagnosed with unfavorable intermediate risk prostate cancer.   CT abdomen and pelvis on 12/04/2020 revealed no evidence of metastatic disease or acute findings in the abdomen.    He is s/p DaVinci laparoscopic radical prostatectomy and bilateral pelvic lymph node dissection on 01/15/2021. Surgical pathology showed prostate, radical prostatectomy acinar adenocarcinoma.    Surgical pathology consistent with Gleason 4+3 prostate cancer, positive extracapsular extension (focal), positive margin, negative lymph nodes, negative seminal vesicles   His most recent PSA on 08/23/2021 was 0.02.   He has no had any erections since surgery and has not taken sildenafil.  At this point, he is completely continent.  No further leakage.  He is no longer wearing safety pads.  No leakage with laughing coughing or sneezing.  PMH: Past Medical History:  Diagnosis Date   Alcohol dependence, daily use ([place]) 08/05/2014   Previously 6-8 beers per day, cut back in 2020 to 2 per day; in 09/2018 he is only drinking 2 beers on Sat and Sun   Chronic prostatitis 05/14/2014   Diabetes mellitus without complication ([facility])    type 2   GERD (gastroesophageal reflux disease)    Hypertension    Pneumonia    Prostate cancer ([place]) 10/2020    Surgical History: Past Surgical History:  Procedure Laterality Date   COLONOSCOPY WITH PROPOFOL N/A 08/02/2021   Procedure: COLONOSCOPY WITH PROPOFOL;   Surgeon: Lin Landsman, MD;  Location: [place];  Service: Endoscopy;  Laterality: N/A;  Diabetic   PELVIC LYMPH NODE DISSECTION Bilateral 01/15/2021   Procedure: PELVIC LYMPH NODE DISSECTION;  Surgeon: Hollice Espy, MD;  Location: [facility];  Service: Urology;  Laterality: Bilateral;   POLYPECTOMY  08/02/2021   Procedure: POLYPECTOMY;  Surgeon: Lin Landsman, MD;  Location: [place];  Service: Endoscopy;;   PROSTATE BIOPSY N/A 10/24/2020   Procedure: PROSTATE BIOPSY;  Surgeon: Abbie Sons, MD;  Location: [facility];  Service: Urology;  Laterality: N/A;   ROBOT ASSISTED LAPAROSCOPIC RADICAL PROSTATECTOMY N/A 01/15/2021   Procedure: XI ROBOTIC ASSISTED LAPAROSCOPIC RADICAL PROSTATECTOMY;  Surgeon: Hollice Espy, MD;  Location: [facility];  Service: Urology;  Laterality: N/A;   TRANSRECTAL ULTRASOUND N/A 10/24/2020   Procedure: TRANSRECTAL ULTRASOUND;  Surgeon: Abbie Sons, MD;  Location: [facility];  Service: Urology;  Laterality: N/A;  Confirmed with Kim in Ultrasound    Home Medications:  Allergies as of 08/31/2021       Reactions   Codeine Other (See Comments)   Patient states "a long time ago, codeine made me dizzy."  He does not recollect which medication it was.         Medication List        Accurate as of August 31, 2021 12:15 PM. If you have any questions, ask your nurse or doctor.          atorvastatin 40 MG tablet Commonly known as: LIPITOR TAKE 1 TABLET BY MOUTH EVERY DAY  B-12 500 MCG Tabs Take 1 tablet by mouth daily.   citalopram 20 MG tablet Commonly known as: CELEXA Take 1 tablet (20 mg total) by mouth daily.   glimepiride 2 MG tablet Commonly known as: AMARYL TAKE 1 TABLET BY MOUTH EVERY DAY WITH BREAKFAST   Jardiance 25 MG Tabs tablet Generic drug: empagliflozin TAKE 1 TABLET BY MOUTH ONCE DAILY BEFORE BREAKFAST   losartan-hydrochlorothiazide 100-25 MG tablet Commonly known as: HYZAAR TAKE 1 TABLET BY MOUTH  EVERY DAY   metFORMIN 500 MG tablet Commonly known as: GLUCOPHAGE Take 2 tablets (1,000 mg total) by mouth 2 (two) times daily with a meal.   oxybutynin 5 MG tablet Commonly known as: DITROPAN Take 1 tablet (5 mg total) by mouth every 8 (eight) hours as needed for bladder spasms.   sildenafil 20 MG tablet Commonly known as: REVATIO 2-5 tablets as needed one hour prior to intercourse.   Trulicity 8.10 FB/5.1WC Sopn Generic drug: Dulaglutide Inject 0.75 mg into the skin once a week.   Vitamin D3 50 MCG (2000 UT) Tabs Take 2,000 Units by mouth daily.        Allergies:  Allergies  Allergen Reactions   Codeine Other (See Comments)    Patient states "a long time ago, codeine made me dizzy."  He does not recollect which medication it was.     Family History: Family History  Problem Relation Age of Onset   Diabetes Mother    Cancer Father        bone   Heart disease Brother    Prostate cancer Brother    Kidney cancer Neg Hx    Kidney disease Neg Hx    Bladder Cancer Neg Hx     Social History:  reports that he quit smoking about 5 months ago. His smoking use included cigarettes. He started smoking about 48 years ago. He has a 11.25 pack-year smoking history. He has never used smokeless tobacco. He reports current alcohol use of about 14.0 standard drinks of alcohol per week. He reports that he does not use drugs.   Physical Exam: BP 115/66   Pulse 67   Ht '5\' 8"'$  (1.727 m)   Wt 175 lb (79.4 kg)   BMI 26.61 kg/m   Constitutional:  Alert and oriented, No acute distress.  Comedy by his wife today. HEENT: [place] AT, moist mucus membranes.  Trachea midline, no masses. Cardiovascular: No clubbing, cyanosis, or edema. Respiratory: Normal respiratory effort, no increased work of breathing. Skin: No rashes, bruises or suspicious lesions. Neurologic: Grossly intact, no focal deficits, moving all 4 extremities. Psychiatric: Normal mood and affect.  Laboratory Data:  Lab Results   Component Value Date   CREATININE 1.06 07/04/2021   Lab Results  Component Value Date   HGBA1C 9.8 (H) 07/04/2021    Assessment & Plan:    Prostate cancer  - S/p  radical prostatectomy  - PSA is very low, stable, and does not meet criteria for biochemical reccurrence -will reserve salvage radiation once he meets this criteria - Discussed differential diagnosis of residual prostate tissue versus fragment of cancerous tissue -Plan to continue PSA check every 6 months  2. Stress incontinence (male) - Resolved  - Reviewed the importance of pelvic floor exercises  3. Erectile dysfunction of organic origin - He has not had any erections but does not wish to pursue intervention.   PSA only 6 months, PSA with MD in 1 year  [place] as a scribe for Hollice Espy, MD.,have  documented all relevant documentation on the behalf of Hollice Espy, MD,as directed by  Hollice Espy, MD while in the presence of Hollice Espy, MD.  I have reviewed the above documentation for accuracy and completeness, and I agree with the above.   Hollice Espy, MD  [facility] [address] [place], [place] [address] [phone]

## 2021-08-30 NOTE — Telephone Encounter (Signed)
Requested Prescriptions  Pending Prescriptions Disp Refills  . atorvastatin (LIPITOR) 40 MG tablet [Pharmacy Med Name: ATORVASTATIN 40 MG TABLET] 90 tablet 1    Sig: TAKE 1 TABLET BY MOUTH EVERY DAY     Cardiovascular:  Antilipid - Statins Failed - 08/30/2021  2:16 AM      Failed - Lipid Panel in normal range within the last 12 months    Cholesterol, Total  Date Value Ref Range Status  07/04/2021 166 100 - 199 mg/dL Final   LDL Chol Calc (NIH)  Date Value Ref Range Status  07/04/2021 87 0 - 99 mg/dL Final   HDL  Date Value Ref Range Status  07/04/2021 40 >39 mg/dL Final   Triglycerides  Date Value Ref Range Status  07/04/2021 234 (H) 0 - 149 mg/dL Final         Passed - Patient is not pregnant      Passed - Valid encounter within last 12 months    Recent Outpatient Visits          1 month ago DM (diabetes mellitus), type 2 with complications [facility])   [facility] Glean Hess, MD   1 month ago Essential hypertension   [facility] Glean Hess, MD   4 months ago DM (diabetes mellitus), type 2 with complications [facility])   [facility] Glean Hess, MD   8 months ago DM (diabetes mellitus), type 2 with complications [facility])   [facility] Glean Hess, MD   10 months ago DM (diabetes mellitus), type 2 with complications [facility])   [place], Laura H, MD      Future Appointments            Tomorrow Hollice Espy, MD [place]   In 2 months Glean Hess, MD [facility], [facility]

## 2021-08-31 ENCOUNTER — Ambulatory Visit (INDEPENDENT_AMBULATORY_CARE_PROVIDER_SITE_OTHER): Payer: Medicare Other | Admitting: Urology

## 2021-08-31 ENCOUNTER — Encounter: Payer: Self-pay | Admitting: Urology

## 2021-08-31 VITALS — BP 115/66 | HR 67 | Ht 68.0 in | Wt 175.0 lb

## 2021-08-31 DIAGNOSIS — Z8546 Personal history of malignant neoplasm of prostate: Secondary | ICD-10-CM

## 2021-08-31 DIAGNOSIS — N529 Male erectile dysfunction, unspecified: Secondary | ICD-10-CM | POA: Diagnosis not present

## 2021-08-31 DIAGNOSIS — C61 Malignant neoplasm of prostate: Secondary | ICD-10-CM

## 2021-09-07 ENCOUNTER — Other Ambulatory Visit: Payer: Self-pay | Admitting: Internal Medicine

## 2021-09-07 DIAGNOSIS — E118 Type 2 diabetes mellitus with unspecified complications: Secondary | ICD-10-CM

## 2021-09-07 NOTE — Telephone Encounter (Signed)
Requested medication (s) are due for refill today: no  Requested medication (s) are on the active medication list: yes  Last refill:  06/11/21 #90   Future visit scheduled: yes  Notes to clinic:  lab work out of date and outside normal range   Requested Prescriptions  Pending Prescriptions Disp Refills   glimepiride (AMARYL) 2 MG tablet [Pharmacy Med Name: GLIMEPIRIDE 2 MG TABLET] 90 tablet 0    Sig: TAKE 1 [place]     Endocrinology:  Diabetes - Sulfonylureas Failed - 09/07/2021  2:30 AM      Failed - HBA1C is between 0 and 7.9 and within 180 days    Hgb A1c MFr Bld  Date Value Ref Range Status  07/04/2021 9.8 (H) 4.8 - 5.6 % Final    Comment:             Prediabetes: 5.7 - 6.4          Diabetes: >6.4          Glycemic control for adults with diabetes: <7.0          Passed - Cr in normal range and within 360 days    Creatinine, Ser  Date Value Ref Range Status  07/04/2021 1.06 0.76 - 1.27 mg/dL Final         Passed - Valid encounter within last 6 months    Recent Outpatient Visits           1 month ago DM (diabetes mellitus), type 2 with complications [facility])   [place], Laura H, MD   2 months ago Essential hypertension   [place], Laura H, MD   4 months ago DM (diabetes mellitus), type 2 with complications [facility])   [place], Laura H, MD   8 months ago DM (diabetes mellitus), type 2 with complications [facility])   [place], Laura H, MD   10 months ago DM (diabetes mellitus), type 2 with complications [facility])   [place], Laura H, MD       Future Appointments             In 2 months Army Melia Jesse Sans, MD [facility], [place]   In 12 months Hollice Espy, [place]

## 2021-10-12 ENCOUNTER — Other Ambulatory Visit: Payer: Self-pay | Admitting: Internal Medicine

## 2021-10-12 DIAGNOSIS — I10 Essential (primary) hypertension: Secondary | ICD-10-CM

## 2021-10-12 NOTE — Telephone Encounter (Signed)
Requested Prescriptions  Pending Prescriptions Disp Refills  . losartan-hydrochlorothiazide (HYZAAR) 100-25 MG tablet [Pharmacy Med Name: LOSARTAN-HCTZ 100-25 MG TAB] 90 tablet 0    Sig: TAKE 1 TABLET BY MOUTH EVERY DAY     Cardiovascular: ARB + Diuretic Combos Passed - 10/12/2021  2:13 AM      Passed - K in normal range and within 180 days    Potassium  Date Value Ref Range Status  07/04/2021 4.2 3.5 - 5.2 mmol/L Final         Passed - Na in normal range and within 180 days    Sodium  Date Value Ref Range Status  07/04/2021 140 134 - 144 mmol/L Final         Passed - Cr in normal range and within 180 days    Creatinine, Ser  Date Value Ref Range Status  07/04/2021 1.06 0.76 - 1.27 mg/dL Final         Passed - eGFR is 10 or above and within 180 days    GFR calc Af Amer  Date Value Ref Range Status  06/16/2019 72 >59 mL/min/1.73 Final    Comment:    **[facility] currently reports eGFR in compliance with the current**   recommendations of the [employer]. [facility] will   update reporting as new guidelines are published from the NKF-ASN   Task force.    GFR, Estimated  Date Value Ref Range Status  01/16/2021 >60 >60 mL/min Final    Comment:    (NOTE) Calculated using the CKD-EPI Creatinine Equation (2021)    eGFR  Date Value Ref Range Status  07/04/2021 77 >59 mL/min/1.73 Final         Passed - Patient is not pregnant      Passed - Last BP in normal range    BP Readings from Last 1 Encounters:  08/31/21 115/66         Passed - Valid encounter within last 6 months    Recent Outpatient Visits          2 months ago DM (diabetes mellitus), type 2 with complications ([place])   [place] [facility] at [facility], Jesse Sans, MD   3 months ago Essential hypertension   [place] [facility] at [facility], Jesse Sans, MD   5 months ago DM (diabetes mellitus), type 2 with complications [facility])    [place] [facility] at [facility] Jesse Sans, MD   9 months ago DM (diabetes mellitus), type 2 with complications [facility])   [place] [facility] at [facility], Jesse Sans, MD   12 months ago DM (diabetes mellitus), type 2 with complications [facility])   [place] [facility] at [facility], Jesse Sans, MD      Future Appointments            In 4 weeks Army Melia Jesse Sans, MD [facility] at [facility], [facility]   In 10 months Hollice Espy, [place]

## 2021-10-22 ENCOUNTER — Other Ambulatory Visit: Payer: Self-pay | Admitting: Internal Medicine

## 2021-10-22 DIAGNOSIS — E118 Type 2 diabetes mellitus with unspecified complications: Secondary | ICD-10-CM

## 2021-10-22 NOTE — Telephone Encounter (Signed)
Medication Refill - Medication: JARDIANCE 25 MG TABS tablet  Has the patient contacted their pharmacy? Yes.     Preferred Pharmacy (with phone number or street name):  [id number] - [place], [place] [address] AT [place] Phone:  [phone]  Fax:  [phone]     Has the patient been seen for an appointment in the last year OR does the patient have an upcoming appointment? Yes.

## 2021-10-23 MED ORDER — EMPAGLIFLOZIN 25 MG PO TABS: 25.0000 mg | ORAL_TABLET | Freq: Every day | ORAL | 0 refills | Status: DC

## 2021-10-23 NOTE — Telephone Encounter (Signed)
Requested Prescriptions  Pending Prescriptions Disp Refills  . empagliflozin (JARDIANCE) 25 MG TABS tablet 90 tablet 0    Sig: Take 1 tablet (25 mg total) by mouth daily before breakfast.     Endocrinology:  Diabetes - SGLT2 Inhibitors Failed - 10/22/2021 10:43 AM      Failed - HBA1C is between 0 and 7.9 and within 180 days    Hgb A1c MFr Bld  Date Value Ref Range Status  07/04/2021 9.8 (H) 4.8 - 5.6 % Final    Comment:             Prediabetes: 5.7 - 6.4          Diabetes: >6.4          Glycemic control for adults with diabetes: <7.0          Passed - Cr in normal range and within 360 days    Creatinine, Ser  Date Value Ref Range Status  07/04/2021 1.06 0.76 - 1.27 mg/dL Final         Passed - eGFR in normal range and within 360 days    GFR calc Af Amer  Date Value Ref Range Status  06/16/2019 72 >59 mL/min/1.73 Final    Comment:    **[facility] currently reports eGFR in compliance with the current**   recommendations of the [employer]. [facility] will   update reporting as new guidelines are published from the NKF-ASN   Task force.    GFR, Estimated  Date Value Ref Range Status  01/16/2021 >60 >60 mL/min Final    Comment:    (NOTE) Calculated using the CKD-EPI Creatinine Equation (2021)    eGFR  Date Value Ref Range Status  07/04/2021 77 >59 mL/min/1.73 Final         Passed - Valid encounter within last 6 months    Recent Outpatient Visits          3 months ago DM (diabetes mellitus), type 2 with complications ([place])   [place] [facility] at [facility], Jesse Sans, MD   3 months ago Essential hypertension   [place] [facility] at [facility], Jesse Sans, MD   6 months ago DM (diabetes mellitus), type 2 with complications [facility])   [place] [facility] at [facility], Jesse Sans, MD   10 months ago DM (diabetes mellitus), type 2 with complications  [facility])   [place] [facility] at [facility], Jesse Sans, MD   1 year ago DM (diabetes mellitus), type 2 with complications [facility])   [place] [facility] at [facility], Jesse Sans, MD      Future Appointments            In 2 weeks Army Melia, Jesse Sans, MD [facility] at [facility]   In 10 months Hollice Espy, [place]

## 2021-10-24 ENCOUNTER — Telehealth: Payer: Self-pay

## 2021-10-24 NOTE — Telephone Encounter (Signed)
PA was denied for Jardiance 25 mg per insurance because:  " After review of your Plan's list of covered drugs, we have found no other brand drugs, appropriate to treat your diagnosis or condition, on a lower cost-sharing tier. Since the drug you requested is already being provided to you at the lowest cost-share amount allowed for your plan, your request cannot be approved under your Part D benefit."

## 2021-10-24 NOTE — Telephone Encounter (Signed)
Jardiance 25 MG Denied by Medicare.  KP

## 2021-11-09 ENCOUNTER — Ambulatory Visit (INDEPENDENT_AMBULATORY_CARE_PROVIDER_SITE_OTHER): Payer: Medicare Other | Admitting: Internal Medicine

## 2021-11-09 ENCOUNTER — Encounter: Payer: Self-pay | Admitting: Internal Medicine

## 2021-11-09 VITALS — BP 118/74 | HR 57 | Ht 68.0 in | Wt 178.0 lb

## 2021-11-09 DIAGNOSIS — Z23 Encounter for immunization: Secondary | ICD-10-CM | POA: Diagnosis not present

## 2021-11-09 DIAGNOSIS — I10 Essential (primary) hypertension: Secondary | ICD-10-CM

## 2021-11-09 DIAGNOSIS — E118 Type 2 diabetes mellitus with unspecified complications: Secondary | ICD-10-CM

## 2021-11-09 LAB — POCT GLYCOSYLATED HEMOGLOBIN (HGB A1C): Hemoglobin A1C: 6.8 % — AB (ref 4.0–5.6)

## 2021-11-09 MED ORDER — TRULICITY 1.5 MG/0.5ML ~~LOC~~ SOAJ: 1.5000 mg | SUBCUTANEOUS | 3 refills | Status: DC

## 2021-11-09 NOTE — Progress Notes (Signed)
Date:  11/09/2021   Name:  Calvin Sanders   DOB:  16-Dec-1955   MRN:  [id number]   Chief Complaint: Diabetes and Hypertension  Diabetes He presents for his follow-up diabetic visit. He has type 2 diabetes mellitus. His disease course has been worsening. Pertinent negatives for hypoglycemia include no headaches or tremors. Pertinent negatives for diabetes include no chest pain, no fatigue, no polydipsia and no polyuria. Pertinent negatives for diabetic complications include no CVA. Current diabetic treatments: metformin, amaryl, jardiance, - trulicity added last visit.  Hypertension This is a chronic problem. The problem is controlled. Pertinent negatives include no chest pain, headaches, palpitations or shortness of breath. Past treatments include angiotensin blockers and diuretics. The current treatment provides significant improvement. There is no history of kidney disease, CAD/MI or CVA.   He has hit the donut hole and has not taken Jardiance in a week.  The Trulicity is also very expensive.  They are struggling to afford the cost.  Lab Results  Component Value Date   NA 140 07/04/2021   K 4.2 07/04/2021   CO2 23 07/04/2021   GLUCOSE 200 (H) 07/04/2021   BUN 12 07/04/2021   CREATININE 1.06 07/04/2021   CALCIUM 10.2 07/04/2021   EGFR 77 07/04/2021   GFRNONAA >60 01/16/2021   Lab Results  Component Value Date   CHOL 166 07/04/2021   HDL 40 07/04/2021   LDLCALC 87 07/04/2021   TRIG 234 (H) 07/04/2021   CHOLHDL 4.2 07/04/2021   Lab Results  Component Value Date   TSH 1.560 06/21/2020   Lab Results  Component Value Date   HGBA1C 6.8 (A) 11/09/2021   Lab Results  Component Value Date   WBC 6.1 07/04/2021   HGB 15.8 07/04/2021   HCT 44.6 07/04/2021   MCV 94 07/04/2021   PLT 225 07/04/2021   Lab Results  Component Value Date   ALT 31 07/04/2021   AST 28 07/04/2021   ALKPHOS 94 07/04/2021   BILITOT 0.6 07/04/2021   Lab Results  Component Value Date   VD25OH  67.0 06/16/2019     Review of Systems  Constitutional:  Negative for appetite change, fatigue and unexpected weight change.  Eyes:  Negative for visual disturbance.  Respiratory:  Negative for cough, shortness of breath and wheezing.   Cardiovascular:  Negative for chest pain, palpitations and leg swelling.  Gastrointestinal:  Negative for abdominal pain and blood in stool.  Endocrine: Negative for polydipsia and polyuria.  Genitourinary:  Negative for dysuria and hematuria.  Skin:  Negative for color change and rash.  Neurological:  Negative for tremors, numbness and headaches.  Psychiatric/Behavioral:  Negative for dysphoric mood.     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     Past Surgical History:  Procedure Laterality Date   COLONOSCOPY WITH PROPOFOL N/A 08/02/2021   Procedure: COLONOSCOPY WITH PROPOFOL;  Surgeon: Lin Landsman, MD;  Location: [facility];  Service: Endoscopy;  Laterality: N/A;  Diabetic   PELVIC LYMPH NODE DISSECTION Bilateral 01/15/2021   Procedure: PELVIC LYMPH NODE DISSECTION;  Surgeon: Hollice Espy, MD;  Location: [facility];  Service:  Urology;  Laterality: Bilateral;   POLYPECTOMY  08/02/2021   Procedure: POLYPECTOMY;  Surgeon: Lin Landsman, MD;  Location: [facility];  Service: Endoscopy;;   PROSTATE BIOPSY N/A 10/24/2020   Procedure: PROSTATE BIOPSY;  Surgeon: Abbie Sons, MD;  Location: [facility];  Service: Urology;  Laterality: N/A;   ROBOT ASSISTED LAPAROSCOPIC RADICAL PROSTATECTOMY N/A 01/15/2021   Procedure: XI ROBOTIC ASSISTED LAPAROSCOPIC RADICAL PROSTATECTOMY;  Surgeon: Hollice Espy, MD;  Location: [facility];  Service: Urology;  Laterality: N/A;   TRANSRECTAL ULTRASOUND N/A 10/24/2020   Procedure: TRANSRECTAL ULTRASOUND;  Surgeon: Abbie Sons, MD;  Location: [facility];  Service: Urology;  Laterality: N/A;  Confirmed with Kim in Ultrasound    Social History   Tobacco Use   Smoking status: Former    Packs/day: 0.25    Years: 45.00    Total pack years: 11.25    Types: Cigarettes    Start date: 02/11/1973    Quit date: 03/14/2021    Years since quitting: 0.6   Smokeless tobacco: Never   Tobacco comments:    3 a day   Vaping Use   Vaping Use: Never used  Substance Use Topics   Alcohol use: Yes    Alcohol/week: 14.0 standard drinks of alcohol    Types: 14 Cans of beer per week    Comment: back up to 1-2 beers per day   Drug use: No     Medication list has been reviewed and updated.  Current Meds  Medication Sig   atorvastatin (LIPITOR) 40 MG tablet TAKE 1 TABLET BY MOUTH EVERY DAY   Cholecalciferol (VITAMIN D3) 2000 units TABS Take 2,000 Units by mouth daily.   citalopram (CELEXA) 20 MG tablet Take 1 tablet (20 mg total) by mouth daily.   Cyanocobalamin (B-12) 500 MCG TABS Take 1 tablet by mouth daily.   Dulaglutide (TRULICITY) 1.5 KY/7.0WC SOPN Inject 1.5 mg into the skin once a week.   glimepiride (AMARYL) 2 MG tablet TAKE 1 TABLET BY MOUTH EVERY DAY WITH BREAKFAST   losartan-hydrochlorothiazide (HYZAAR) 100-25 MG tablet TAKE 1 TABLET BY MOUTH EVERY DAY   metFORMIN  (GLUCOPHAGE) 500 MG tablet Take 2 tablets (1,000 mg total) by mouth 2 (two) times daily with a meal.   oxybutynin (DITROPAN) 5 MG tablet Take 1 tablet (5 mg total) by mouth every 8 (eight) hours as needed for bladder spasms.   sildenafil (REVATIO) 20 MG tablet 2-5 tablets as needed one hour prior to intercourse.   [DISCONTINUED] Dulaglutide (TRULICITY) 3.76 EG/3.1DV SOPN Inject 0.75 mg into the skin once a week.       11/09/2021    8:26 AM 07/04/2021    9:27 AM 04/23/2021    9:43 AM 12/25/2020    1:17 PM  GAD 7 : Generalized Anxiety Score  Nervous, Anxious, on Edge 0 0 0 0  Control/stop worrying 0 0 0 0  Worry too much - different things 0 0 0 0  Trouble relaxing 0 0 0 0  Restless 0 0 0 0  Easily annoyed or irritable 1 0 0 0  Afraid - awful might happen 0 0 0 0  Total GAD 7 Score 1 0 0 0  Anxiety Difficulty Not difficult at all Not difficult at all Not difficult at all Not difficult at all       11/09/2021    8:26 AM 07/04/2021    9:27 AM 04/23/2021    9:43 AM  Depression screen PHQ 2/9  Decreased Interest 0 0 0  Down, Depressed, Hopeless 0 0 0  PHQ - 2 Score 0 0 0  Altered sleeping 0 0 0  Tired, decreased energy 0 0 0  Change in appetite 0 0 0  Feeling bad or failure about yourself  0 0 0  Trouble concentrating 0 0 0  Moving slowly or fidgety/restless 0 0 0  Suicidal thoughts 0 0 0  PHQ-9 Score 0 0 0  Difficult doing work/chores Not difficult at all Not difficult at all Not difficult at all    BP Readings from Last 3 Encounters:  11/09/21 118/74  08/31/21 115/66  08/02/21 112/67    Physical Exam Vitals and nursing note reviewed.  Constitutional:      General: He is not in acute distress.    Appearance: Normal appearance. He is well-developed.  HENT:     Head: Normocephalic and atraumatic.  Cardiovascular:     Rate and Rhythm: Normal rate and regular rhythm.  Pulmonary:     Effort: Pulmonary effort is normal. No respiratory distress.     Breath sounds: No  rhonchi.  Musculoskeletal:     Cervical back: Normal range of motion.     Right lower leg: No edema.     Left lower leg: No edema.  Lymphadenopathy:     Cervical: No cervical adenopathy.  Skin:    General: Skin is warm and dry.     Findings: No rash.  Neurological:     Mental Status: He is alert and oriented to person, place, and time.  Psychiatric:        Mood and Affect: Mood normal.        Behavior: Behavior normal.     Wt Readings from Last 3 Encounters:  11/09/21 178 lb (80.7 kg)  08/31/21 175 lb (79.4 kg)  08/02/21 178 lb 12.8 oz (81.1 kg)    BP 118/74 (BP Location: Right Arm, Cuff Size: Normal)   Pulse (!) 57   Ht _0  (1.727 m)   Wt 178 lb (80.7 kg)   SpO2 97%   BMI 27.06 kg/m   Assessment and Plan: 1. DM (diabetes mellitus), type 2 with complications ([place]) BS is much improved. Will refer to Pharmacy for PA on both medications. Increase dose of Trulicity Samples of Jardiance. - POCT glycosylated hemoglobin (Hb A1C)= 6.8 down from > 9 - AMB Referral to Pharmacy Medication Management - Dulaglutide (TRULICITY) 1.5 MW/4.1LK SOPN; Inject 1.5 mg into the skin once a week.  Dispense: 2 mL; Refill: 3  2. Essential hypertension Clinically stable exam with well controlled BP. Tolerating medications without side effects at this time. Pt to continue current regimen and low sodium diet; benefits of regular exercise as able discussed.  3. Need for immunization against influenza - Flu Vaccine QUAD High Dose(Fluad)   Partially dictated using Editor, commissioning. Any errors are unintentional.  Halina Maidens, MD [facility]  11/09/2021

## 2021-11-19 ENCOUNTER — Telehealth: Payer: Self-pay

## 2021-11-19 NOTE — Chronic Care Management (AMB) (Signed)
  Care Coordination   Note   11/19/2021 Name: Alfonzia Woolum MRN: [id number] DOB: December 20, 1955  Janzen Sacks is a 66 y.o. year old male who sees Glean Hess, MD for primary care. I reached out to [name] by phone today to offer care coordination services.  Mr. Bellew was given information about Care Coordination services today including:   The Care Coordination services include support from the care team which includes your Nurse Coordinator, Clinical Social Worker, or Pharmacist.  The Care Coordination team is here to help remove barriers to the health concerns and goals most important to you. Care Coordination services are voluntary, and the patient may decline or stop services at any time by request to their care team member.   Care Coordination Consent Status: Patient agreed to services and verbal consent obtained.   Follow up plan:  Telephone appointment with care coordination team member scheduled for:  12/04/2021  Encounter Outcome:  Pt. Scheduled  Noreene Larsson, [place],  [id number] Direct Dial: [phone] [email or web address]

## 2021-11-21 ENCOUNTER — Ambulatory Visit (INDEPENDENT_AMBULATORY_CARE_PROVIDER_SITE_OTHER): Payer: Medicare Other

## 2021-11-21 ENCOUNTER — Other Ambulatory Visit: Payer: Self-pay

## 2021-11-21 VITALS — Ht 68.0 in | Wt 178.0 lb

## 2021-11-21 DIAGNOSIS — Z0000 Encounter for general adult medical examination without abnormal findings: Secondary | ICD-10-CM | POA: Diagnosis not present

## 2021-11-21 NOTE — Progress Notes (Signed)
Subjective:   Calvin Sanders is a 66 y.o. male who presents for Medicare Annual/Subsequent preventive examination.  I connected with  [name] on 11/21/21 by a audio enabled telemedicine application and verified that I am speaking with the correct person using two identifiers.  Patient Location: Home  Provider Location: Office/Clinic  I discussed the limitations of evaluation and management by telemedicine. The patient expressed understanding and agreed to proceed.    Review of Systems    Defer to PCP Cardiac Risk Factors include: diabetes mellitus;male gender     Objective:    Today's Vitals   11/21/21 0922 11/21/21 0946  Weight: 178 lb (80.7 kg)   Height: '5\' 8"'$  (1.727 m)   PainSc:  0-No pain   Body mass index is 27.06 kg/m.     11/21/2021    9:47 AM 08/02/2021    7:46 AM 01/15/2021    4:57 PM 01/08/2021    3:37 PM 11/20/2020   10:35 AM 10/24/2020    8:42 AM 10/17/2020   11:30 AM  Advanced Directives  Does Patient Have a Medical Advance Directive? No No No No No No No  Would patient like information on creating a medical advance directive? No - Patient declined No - Patient declined No - Patient declined No - Patient declined No - Patient declined No - Patient declined No - Patient declined    Current Medications (verified) Outpatient Encounter Medications as of 11/21/2021  Medication Sig   atorvastatin (LIPITOR) 40 MG tablet TAKE 1 TABLET BY MOUTH EVERY DAY   Cholecalciferol (VITAMIN D3) 2000 units TABS Take 2,000 Units by mouth daily.   citalopram (CELEXA) 20 MG tablet Take 1 tablet (20 mg total) by mouth daily.   Cyanocobalamin (B-12) 500 MCG TABS Take 1 tablet by mouth daily.   Dulaglutide (TRULICITY) 1.5 IP/3.8SN SOPN Inject 1.5 mg into the skin once a week.   glimepiride (AMARYL) 2 MG tablet TAKE 1 TABLET BY MOUTH EVERY DAY WITH BREAKFAST   losartan-hydrochlorothiazide (HYZAAR) 100-25 MG tablet TAKE 1 TABLET BY MOUTH EVERY DAY   metFORMIN (GLUCOPHAGE)  500 MG tablet Take 2 tablets (1,000 mg total) by mouth 2 (two) times daily with a meal.   oxybutynin (DITROPAN) 5 MG tablet Take 1 tablet (5 mg total) by mouth every 8 (eight) hours as needed for bladder spasms.   sildenafil (REVATIO) 20 MG tablet 2-5 tablets as needed one hour prior to intercourse.   No facility-administered encounter medications on file as of 11/21/2021.    Allergies (verified) Codeine   History: Past Medical History:  Diagnosis Date   Alcohol dependence, daily use ([place]) 08/05/2014   Previously 6-8 beers per day, cut back in 2020 to 2 per day; in 09/2018 he is only drinking 2 beers on Sat and Sun   Chronic prostatitis 05/14/2014   Diabetes mellitus without complication (HCC)    type 2   GERD (gastroesophageal reflux disease)    Hypertension    Pneumonia    Prostate cancer ([place]) 10/2020   Past Surgical History:  Procedure Laterality Date   COLONOSCOPY WITH PROPOFOL N/A 08/02/2021   Procedure: COLONOSCOPY WITH PROPOFOL;  Surgeon: Lin Landsman, MD;  Location: [facility];  Service: Endoscopy;  Laterality: N/A;  Diabetic   PELVIC LYMPH NODE DISSECTION Bilateral 01/15/2021   Procedure: PELVIC LYMPH NODE DISSECTION;  Surgeon: Hollice Espy, MD;  Location: [facility];  Service: Urology;  Laterality: Bilateral;   POLYPECTOMY  08/02/2021   Procedure: POLYPECTOMY;  Surgeon: Sherri Sear  Reece Levy, MD;  Location: [facility];  Service: Endoscopy;;   PROSTATE BIOPSY N/A 10/24/2020   Procedure: PROSTATE BIOPSY;  Surgeon: Abbie Sons, MD;  Location: [facility];  Service: Urology;  Laterality: N/A;   ROBOT ASSISTED LAPAROSCOPIC RADICAL PROSTATECTOMY N/A 01/15/2021   Procedure: XI ROBOTIC ASSISTED LAPAROSCOPIC RADICAL PROSTATECTOMY;  Surgeon: Hollice Espy, MD;  Location: [facility];  Service: Urology;  Laterality: N/A;   TRANSRECTAL ULTRASOUND N/A 10/24/2020   Procedure: TRANSRECTAL ULTRASOUND;  Surgeon: Abbie Sons, MD;  Location: [facility];  Service:  Urology;  Laterality: N/A;  Confirmed with Kim in Ultrasound   Family History  Problem Relation Age of Onset   Diabetes Mother    Cancer Father        bone   Heart disease Brother    Prostate cancer Brother    Kidney cancer Neg Hx    Kidney disease Neg Hx    Bladder Cancer Neg Hx    Social History   Socioeconomic History   Marital status: Married    Spouse name: Joelene Millin   Number of children: 2   Years of education: Not on file   Highest education level: Not on file  Occupational History   Not on file  Tobacco Use   Smoking status: Former    Packs/day: 0.25    Years: 45.00    Total pack years: 11.25    Types: Cigarettes    Start date: 02/11/1973    Quit date: 03/14/2021    Years since quitting: 0.6   Smokeless tobacco: Never  Vaping Use   Vaping Use: Never used  Substance and Sexual Activity   Alcohol use: Yes    Alcohol/week: 14.0 standard drinks of alcohol    Types: 14 Cans of beer per week    Comment: back up to 1-2 beers per day   Drug use: Never   Sexual activity: Yes    Partners: Female    Birth control/protection: None  Other Topics Concern   Not on file  Social History Narrative   Lives with Wife. Wife manages cooking, cleaning, medications, and pay bills in the home.   Social Determinants of Health   Financial Resource Strain: Low Risk  (11/21/2021)   Overall Financial Resource Strain (CARDIA)    Difficulty of Paying Living Expenses: Not hard at all  Food Insecurity: No Food Insecurity (11/21/2021)   Hunger Vital Sign    Worried About Running Out of Food in the Last Year: Never true    Ran Out of Food in the Last Year: Never true  Transportation Needs: No Transportation Needs (11/21/2021)   PRAPARE - Hydrologist (Medical): No    Lack of Transportation (Non-Medical): No  Physical Activity: Insufficiently Active (11/21/2021)   Exercise Vital Sign    Days of Exercise per Week: 1 day    Minutes of Exercise per Session: 60  min  Stress: No Stress Concern Present (11/21/2021)   [place]    Feeling of Stress : Not at all  Social Connections: Socially Isolated (11/21/2021)   Social Connection and Isolation Panel [NHANES]    Frequency of Communication with Friends and Family: Once a week    Frequency of Social Gatherings with Friends and Family: Once a week    Attends Religious Services: Never    Marine scientist or Organizations: No    Attends Archivist Meetings: Never    Marital Status:  Married    Tobacco Counseling Counseling given: Yes   Clinical Intake:  Pre-visit preparation completed: Yes  Pain : No/denies pain Pain Score: 0-No pain     BMI - recorded: 27.06 Nutritional Status: BMI 25 -29 Overweight Nutritional Risks: None Diabetes: Yes CBG done?: No     Diabetic? YES.     Information entered by :: Wyatt Haste, [place] of Daily Living    11/21/2021    9:48 AM 08/02/2021    7:34 AM  In your present state of health, do you have any difficulty performing the following activities:  Hearing? 0 0  Vision? 0 0  Difficulty concentrating or making decisions? 0 0  Walking or climbing stairs? 0 0  Dressing or bathing? 0 0  Doing errands, shopping? 0   Preparing Food and eating ? Y   Using the Toilet? N   In the past six months, have you accidently leaked urine? N   Do you have problems with loss of bowel control? N   Managing your Medications? Y   Managing your Finances? Y   Housekeeping or managing your Housekeeping? Y     Patient Care Team: Glean Hess, MD as PCP - General (Internal Medicine) Nori Riis PA-C as Physician Assistant (Urology) [facility] [place] [facility]) Jodi Mourning, Grace Bushy, RPH-CPP (Pharmacist)  Indicate any recent Medical Services you may have received from other than [facility] providers in the past year (date may be approximate).      Assessment:   This is a routine wellness examination for [place].  Hearing/Vision screen Hearing Screening - Comments:: No concerns. Vision Screening - Comments:: Wears reading glasses.  Dietary issues and exercise activities discussed: Current Exercise Habits: The patient does not participate in regular exercise at present   Goals Addressed             This Visit's Progress    Patient Stated   On track    Recommend lowering A1c to goal of less than 7% with healthy eating and physical activity      Depression Screen    11/21/2021    9:48 AM 11/09/2021    8:26 AM 07/04/2021    9:27 AM 04/23/2021    9:43 AM 12/25/2020    1:17 PM 11/20/2020   10:33 AM 10/13/2020    3:25 PM  PHQ 2/9 Scores  PHQ - 2 Score 0 0 0 0 0 0 0  PHQ- 9 Score 0 0 0 0 1  0    Fall Risk    11/21/2021    9:48 AM 11/09/2021    8:26 AM 07/04/2021    9:27 AM 04/23/2021    9:43 AM 12/25/2020    1:17 PM  Fall Risk   Falls in the past year? 0 0 0 0 0  Number falls in past yr: 0 0 0 0 0  Injury with Fall? 0 0 0 0 0  Risk for fall due to : No Fall Risks No Fall Risks No Fall Risks No Fall Risks No Fall Risks  Follow up Falls evaluation completed Falls evaluation completed Follow up appointment Falls evaluation completed Falls evaluation completed    [place]:  Any stairs in or around the home? No  If so, are there any without handrails?  N/A Home free of loose throw rugs in walkways, pet beds, electrical cords, etc? Yes  Adequate lighting in your home to reduce risk of falls? Yes   ASSISTIVE  DEVICES UTILIZED TO PREVENT FALLS:  Life alert? No  Use of a cane, walker or w/c? No  Grab bars in the bathroom? No  Shower chair or bench in shower? No  Elevated toilet seat or a handicapped toilet? No    Cognitive Function:        11/21/2021    9:50 AM 02/14/2020    3:47 PM 06/16/2019    9:10 AM 05/05/2018   10:08 AM  6CIT Screen  What Year? 4 points 0 points 4 points 0  points  What month? 3 points 0 points 3 points 0 points  What time? 3 points 0 points 3 points 0 points  Count back from 20 2 points 0 points 0 points 0 points  Months in reverse 2 points 0 points 0 points 0 points  Repeat phrase 2 points 0 points 0 points 2 points  Total Score 16 points 0 points 10 points 2 points    Immunizations Immunization History  Administered Date(s) Administered   Fluad Quad(high Dose 65+) 10/20/2019, 11/09/2021   Influenza,inj,Quad PF,6+ Mos 11/26/2018   Moderna Sars-Covid-2 Vaccination 06/30/2019, 07/28/2019   Pneumococcal Conjugate-13 09/16/2016   Pneumococcal Polysaccharide-23 02/24/2019   Tdap 09/16/2016    TDAP status: Up to date  Flu Vaccine status: Up to date  Pneumococcal vaccine status: Up to date  Covid-19 vaccine status: Completed vaccines  Qualifies for Shingles Vaccine? Yes   Zostavax completed No   Shingrix Completed?: No.    Education has been provided regarding the importance of this vaccine. Patient has been advised to call insurance company to determine out of pocket expense if they have not yet received this vaccine. Advised may also receive vaccine at local pharmacy or Health Dept. Verbalized acceptance and understanding.  Screening Tests Health Maintenance  Topic Date Due   Zoster Vaccines- Shingrix (1 of 2) Never done   OPHTHALMOLOGY EXAM  03/02/2020   FOOT EXAM  04/24/2022   HEMOGLOBIN A1C  05/10/2022   Diabetic kidney evaluation - GFR measurement  07/05/2022   Diabetic kidney evaluation - Urine ACR  07/05/2022   TETANUS/TDAP  09/17/2026   COLONOSCOPY (Pts 45-63yr Insurance coverage will need to be confirmed)  08/03/2031   Pneumonia Vaccine 68 Years old  Completed   INFLUENZA VACCINE  Completed   Hepatitis C Screening  Completed   HPV VACCINES  Aged Out   COVID-19 Vaccine  Discontinued    Health Maintenance  Health Maintenance Due  Topic Date Due   Zoster Vaccines- Shingrix (1 of 2) Never done   OPHTHALMOLOGY  EXAM  03/02/2020    Colorectal cancer screening: Type of screening: Colonoscopy. Completed 08/02/2021. Repeat every 10 years  Lung Cancer Screening: (Low Dose CT Chest recommended if Age 68-80years, 30 pack-year currently smoking OR have quit w/in 15years.) does not qualify.   Lung Cancer Screening Referral: N/A  Additional Screening:  Hepatitis C Screening: does qualify; Completed 09/17/2014  Vision Screening: Recommended annual ophthalmology exams for early detection of glaucoma and other disorders of the eye. Is the patient up to date with their annual eye exam?  Yes  Who is the provider or what is the name of the office in which the patient attends annual eye exams? [facility] [place] is not established with a provider, would they like to be referred to a provider to establish care?  N/A .   Dental Screening: Recommended annual dental exams for proper oral hygiene  Community Resource Referral / Chronic Care Management: CRR required  this visit?  No   CCM required this visit?  No      Plan:     I have personally reviewed and noted the following in the patient's chart:   Medical and social history Use of alcohol, tobacco or illicit drugs  Current medications and supplements including opioid prescriptions. Patient is not currently taking opioid prescriptions. Functional ability and status Nutritional status Physical activity Advanced directives List of other physicians Hospitalizations, surgeries, and ER visits in previous 12 months Vitals Screenings to include cognitive, depression, and falls Referrals and appointments  In addition, I have reviewed and discussed with patient certain preventive protocols, quality metrics, and best practice recommendations. A written personalized care plan for preventive services as well as general preventive health recommendations were provided to patient.     Clista Bernhardt, [place]   11/21/2021     Mr. Chana Bode , Thank  you for taking time to come for your Medicare Wellness Visit. I appreciate your ongoing commitment to your health goals. Please review the following plan we discussed and let me know if I can assist you in the future.   These are the goals we discussed:  Goals      Patient Stated     Recommend lowering A1c to goal of less than 7% with healthy eating and physical activity        This is a list of the screening recommended for you and due dates:  Health Maintenance  Topic Date Due   Zoster (Shingles) Vaccine (1 of 2) Never done   Eye exam for diabetics  03/02/2020   Complete foot exam   04/24/2022   Hemoglobin A1C  05/10/2022   Yearly kidney function blood test for diabetes  07/05/2022   Yearly kidney health urinalysis for diabetes  07/05/2022   Tetanus Vaccine  09/17/2026   Colon Cancer Screening  08/03/2031   Pneumonia Vaccine  Completed   Flu Shot  Completed   Hepatitis C Screening: USPSTF Recommendation to screen - Ages 18-79 yo.  Completed   HPV Vaccine  Aged Out   COVID-19 Vaccine  Discontinued     Nurse Notes: Scored a 16 on 6-CIT. Requested Eye Exam from [facility].

## 2021-12-02 ENCOUNTER — Other Ambulatory Visit: Payer: Self-pay | Admitting: Internal Medicine

## 2021-12-02 DIAGNOSIS — E118 Type 2 diabetes mellitus with unspecified complications: Secondary | ICD-10-CM

## 2021-12-04 ENCOUNTER — Other Ambulatory Visit: Payer: 59 | Admitting: Pharmacist

## 2021-12-04 MED ORDER — BLOOD GLUCOSE MONITOR KIT: PACK | 11 refills | Status: AC

## 2021-12-04 NOTE — Progress Notes (Signed)
12/04/2021 Name: Calvin Sanders MRN: [id number] DOB: July 25, 1955  Chief Complaint  Patient presents with   Medication Management   Diabetes    Calvin Sanders is a 66 y.o. year old male who presented for a telephone visit.   They were referred to the pharmacist by their PCP for assistance in managing diabetes, hypertension, and hyperlipidemia.    Subjective:  Care Team: Primary Care Provider: Glean Hess, MD ; Next Scheduled Visit: 03/12/22  Medication Access/Adherence  Current Pharmacy:  [facility], [place] [address] [place] [address] Phone: [phone] Fax: [phone]  [id number] [address] [place]Phone: [phone]: [phone] [place] [place] [address]Phone: [phone]: [phone]  Patient reports affordability concerns with their medications: Yes  Patient reports access/transportation concerns to their pharmacy: No  Patient reports adherence concerns with their medications:  Yes     Diabetes:  Current medications: metformin 1000 mg twice daily - though patient reports he has reduced to 500 mg twice daily due to diarrhea; Trulicity 1.5 mg weekly; previously on Jardiance 25 mg daily but ran out; glimepiride 2 mg daily  Current glucose readings: not checking, does not have a glucometer   Patient denies hypoglycemic s/sx including dizziness, shakiness, sweating.  Current meal patterns:  - Breakfast: biscuit w/ sausage, egg; sometimes fry bacon/sausage and eggs;  - Lunch: hotdogs, hamburgers; salads; sometimes eating out, sometimes at home - Supper: sometimes cooks at home, sometimes eats out - roast, vegetables, - Drinks: diet soda,   Current physical activity: none  Hypertension:  Current medications: losartan/HCTZ 100/25  mg daily   Patient has a automated, upper arm home BP cuff, but has not been using it  Patient denies hypotensive s/sx including dizziness, lightheadedness.   Hyperlipidemia/ASCVD Risk Reduction  Current lipid lowering medications: atorvastatin 40 mg daily  Health Maintenance  Health Maintenance Due  Topic Date Due   Zoster Vaccines- Shingrix (1 of 2) Never done   OPHTHALMOLOGY EXAM  03/02/2020     Objective: Lab Results  Component Value Date   HGBA1C 6.8 (A) 11/09/2021    Lab Results  Component Value Date   CREATININE 1.06 07/04/2021   BUN 12 07/04/2021   NA 140 07/04/2021   K 4.2 07/04/2021   CL 99 07/04/2021   CO2 23 07/04/2021    Lab Results  Component Value Date   CHOL 166 07/04/2021   HDL 40 07/04/2021   LDLCALC 87 07/04/2021   TRIG 234 (H) 07/04/2021   CHOLHDL 4.2 07/04/2021    Medications Reviewed Today     Reviewed by HOsker Mason RPH-CPP (Pharmacist) on 12/04/21 at [id number] Med List Status: <None>   Medication Order Taking? Sig Documenting Provider Last Dose Status Informant  atorvastatin (LIPITOR) 40 MG tablet [id number] TAKE 1 TABLET BY MOUTH EVERY DAY BGlean Hess MD Taking Active   Cholecalciferol (VITAMIN D3) 2000 units TABS [id number] Take 2,000 Units by mouth daily. [provider] Taking Active Spouse/Significant Other  citalopram (CELEXA) 20 MG tablet [id number] Take 1 tablet (20 mg total) by mouth daily. BGlean Hess MD Taking Active   Cyanocobalamin (B-12) 500 MCG TABS [id number] Take 1 tablet by mouth daily. PAdline Potter MD Taking Active Spouse/Significant  Other  Dulaglutide (TRULICITY) 1.5 MA/2.6JF SOPN [id number] Yes Inject 1.5 mg into the skin once a week. Glean Hess, MD Taking Active   glimepiride (AMARYL) 2 MG tablet [id number] Yes TAKE 1 TABLET BY MOUTH EVERY DAY WITH BREAKFAST Glean Hess, MD Taking Active   losartan-hydrochlorothiazide [facility]) 100-25 MG tablet [id number] Yes TAKE 1  TABLET BY MOUTH EVERY DAY Glean Hess, MD Taking Active   metFORMIN (GLUCOPHAGE) 500 MG tablet [id number] Yes Take 2 tablets (1,000 mg total) by mouth 2 (two) times daily with a meal. Glean Hess, MD Taking Active            Med Note Jodi Mourning, Ulrick Methot T   Tue Dec 04, 2021  9:57 AM) 1 tablet twice daily  oxybutynin (DITROPAN) 5 MG tablet [id number] No Take 1 tablet (5 mg total) by mouth every 8 (eight) hours as needed for bladder spasms.  Patient not taking: Reported on 12/04/2021   Debroah Loop, PA-C Not Taking Active   sildenafil (REVATIO) 20 MG tablet [id number] No 2-5 tablets as needed one hour prior to intercourse.  Patient not taking: Reported on 12/04/2021   Hollice Espy, MD Not Taking Active               Assessment/Plan:   Diabetes: - Currently controlled per last A1c but with medication access needs - Reviewed long term cardiovascular and renal outcomes of uncontrolled blood sugar - Reviewed goal A1c, goal fasting, and goal 2 hour post prandial glucose. Patient requests prescription for glucometer. Ordered to pharmacy per Primary Care Standing Order.  - Reviewed dietary modifications including: reducing carbohydrate portion sizes, swapping out servings of diet coke for water or flavored water.  - Reviewed lifestyle modifications including: incorporating exercise goals, such as walking 10-15 minutes a few days a week and working up from there.  - Discussed income. Patient qualifies for Jardiance and Trulicity assistance. However, due to medication back orders, Trulicity patient assistance is closed. Recommend pursuing assistance for Ozempic through [employer] given lack of Trulicity availability. Will collaborate with Dr. Army Melia.  - Recommend to change metformin IR 500 mg tabs, 2 tablets twice daily to XR formulation to reduce risk of diarrhea and allow for max dosing. Discussed risk of hypoglycemia, weight gain with glimepiride with patient. Ideally,  will maximize metformin dose and establish medication access for SGLT2 and GLP1 and will allow patient to discontinue glimepiride.   Hypertension: - Currently controlled - Reviewed appropriate blood pressure monitoring technique and reviewed goal blood pressure. Recommended to check home blood pressure and heart rate periodically, especially if continued weight loss with GLP1.  - Recommend to continue current regimen at this time   Hyperlipidemia/ASCVD Risk Reduction: - Currently controlled at goal <100, though could consider more stringent goal <70 - Recommend to continue current regimen at this time    Follow Up Plan: phone call pending plan from PCP  Catie Jodi Mourning, PharmD, [place], [place] [phone]

## 2021-12-04 NOTE — Telephone Encounter (Signed)
Requested Prescriptions  Pending Prescriptions Disp Refills  . glimepiride (AMARYL) 2 MG tablet [Pharmacy Med Name: GLIMEPIRIDE 2 MG TABLET] 90 tablet 1    Sig: TAKE 1 TABLET BY MOUTH [place]     Endocrinology:  Diabetes - Sulfonylureas Passed - 12/02/2021  9:34 AM      Passed - HBA1C is between 0 and 7.9 and within 180 days    Hemoglobin A1C  Date Value Ref Range Status  11/09/2021 6.8 (A) 4.0 - 5.6 % Final   Hgb A1c MFr Bld  Date Value Ref Range Status  07/04/2021 9.8 (H) 4.8 - 5.6 % Final    Comment:             Prediabetes: 5.7 - 6.4          Diabetes: >6.4          Glycemic control for adults with diabetes: <7.0          Passed - Cr in normal range and within 360 days    Creatinine, Ser  Date Value Ref Range Status  07/04/2021 1.06 0.76 - 1.27 mg/dL Final         Passed - Valid encounter within last 6 months    Recent Outpatient Visits          3 weeks ago DM (diabetes mellitus), type 2 with complications ([place])   [facility], Jesse Sans, MD   4 months ago DM (diabetes mellitus), type 2 with complications [facility])   [place] [facility] at [facility], Jesse Sans, MD   5 months ago Essential hypertension   [place] [facility] at [facility], Jesse Sans, MD   7 months ago DM (diabetes mellitus), type 2 with complications [facility])   [place] [facility] at [facility], Jesse Sans, MD   11 months ago DM (diabetes mellitus), type 2 with complications [facility])   [place] [facility] at [facility], Jesse Sans, MD      Future Appointments            In 3 months Army Melia, Jesse Sans, MD [place] at [facility], [facility]   In 9 months Hollice Espy, [facility] [place]

## 2021-12-04 NOTE — Patient Instructions (Addendum)
Oswell,   It was great talking to you today!  I will talk to Dr. Army Melia about:  1) Changing metformin immediate release to metformin extended release to reduce the risk of diarrhea and allow you to maximize the benefit of this medication 2) Pursuing patient assistance for Jardiance and Ozempic, as Trulicity patient assistance is currently closed.   Check your blood sugars several times weekly, alternating between:  1) Fasting, first thing in the morning before breakfast and  2) 2 hours after your largest meal.   For a goal A1c of less than 7%, goal fasting readings are less than 130 and goal 2 hour after meal readings are less than 180.   Check your blood pressure periodically, and any time you have concerning symptoms like headache, chest pain, dizziness, shortness of breath, or vision changes.   Our goal is less than 130/80.  To appropriately check your blood pressure, make sure you do the following:  1) Avoid caffeine, exercise, or tobacco products for 30 minutes before checking. Empty your bladder. 2) Sit with your back supported in a flat-backed chair. Rest your arm on something flat (arm of the chair, table, etc). 3) Sit still with your feet flat on the floor, resting, for at least 5 minutes.  4) Check your blood pressure. Take 1-2 readings.  5) Write down these readings and bring with you to any provider appointments.  Bring your home blood pressure machine with you to a provider's office for accuracy comparison at least once a year.   Make sure you take your blood pressure medications before you come to any office visit, even if you were asked to fast for labs.  Take care!  Catie Hedwig Morton, PharmD, [facility] [phone]

## 2021-12-12 ENCOUNTER — Telehealth: Payer: Self-pay | Admitting: Internal Medicine

## 2021-12-12 ENCOUNTER — Other Ambulatory Visit: Payer: Self-pay

## 2021-12-12 MED ORDER — OZEMPIC (0.25 OR 0.5 MG/DOSE) 2 MG/3ML ~~LOC~~ SOPN: PEN_INJECTOR | SUBCUTANEOUS | 0 refills | Status: AC

## 2021-12-12 NOTE — Telephone Encounter (Signed)
Pt and his wife spoke with Barnetta Chapel last week about qualifying for Jardiance or Ozempic for free / pt is now out of Trulicity and wanted to know what he needs to do to get the Jardiance or Ozempic / please advise asap

## 2021-12-12 NOTE — Telephone Encounter (Signed)
Called pt sent in [place] in place of Jardiance told pts wife to call Barnetta Chapel to see if she could get if for free with pt assistance. She stated she had [place] number.  KP

## 2021-12-13 ENCOUNTER — Other Ambulatory Visit: Payer: Self-pay | Admitting: Internal Medicine

## 2021-12-13 DIAGNOSIS — E118 Type 2 diabetes mellitus with unspecified complications: Secondary | ICD-10-CM

## 2021-12-13 MED ORDER — METFORMIN HCL ER 500 MG PO TB24: 1000.0000 mg | ORAL_TABLET | Freq: Two times a day (BID) | ORAL | 1 refills | Status: DC

## 2021-12-17 ENCOUNTER — Other Ambulatory Visit: Payer: Self-pay | Admitting: Internal Medicine

## 2021-12-17 ENCOUNTER — Other Ambulatory Visit (HOSPITAL_COMMUNITY): Payer: Self-pay

## 2021-12-17 ENCOUNTER — Other Ambulatory Visit: Payer: Self-pay | Admitting: Pharmacist

## 2021-12-17 DIAGNOSIS — E118 Type 2 diabetes mellitus with unspecified complications: Secondary | ICD-10-CM

## 2021-12-17 MED ORDER — PEN NEEDLES 32G X 4 MM MISC: 1.0000 | 0 refills | Status: AC

## 2021-12-17 NOTE — Telephone Encounter (Signed)
Sounds good! Once received will sign and fax back.  - Arnisha Laffoon

## 2021-12-17 NOTE — Telephone Encounter (Signed)
Pt wife is calling after speaking with Joellen Jersey two weeks ago that the ozempic and jardience would be no charge. However, was advised that waiting on Dr. Gaspar Cola approval. Please advise CB [phone]

## 2021-12-17 NOTE — Progress Notes (Signed)
Care Coordination Call  Received voicemail from patient's wife, Maudie Mercury. Reviewed that Dr. Army Melia is in agreement as evidenced by sending order for metformin XR and Ozempic to the pharmacy.   Collaborated with pharmacy technician to mail patient their portion of the application, fax provider portion to Dr. Army Melia.   Called patient's wife back and reviewed medication access plan. She will pick up Ozempic to start while we work on patient assistance. Reviewed goal blood sugars.   Catie Hedwig Morton, PharmD, [facility] [phone]

## 2021-12-19 ENCOUNTER — Other Ambulatory Visit (HOSPITAL_COMMUNITY): Payer: Self-pay

## 2021-12-19 ENCOUNTER — Telehealth: Payer: Self-pay

## 2021-12-19 NOTE — Telephone Encounter (Signed)
Spoke to Patient wife regarding assistance for [employer]. Informed wife he will need to apply for LIS  with [employer] for the [facility] application. She expressed understanding and would like the app mailed to her home in the mean time.   Mailed [employer] renewal application to patient home.   Will fax PCP pages for application when returned to The office

## 2021-12-27 ENCOUNTER — Other Ambulatory Visit: Payer: Self-pay | Admitting: Internal Medicine

## 2021-12-27 DIAGNOSIS — E118 Type 2 diabetes mellitus with unspecified complications: Secondary | ICD-10-CM

## 2021-12-27 DIAGNOSIS — F331 Major depressive disorder, recurrent, moderate: Secondary | ICD-10-CM

## 2021-12-27 NOTE — Telephone Encounter (Signed)
Requested Prescriptions  Pending Prescriptions Disp Refills   glimepiride (AMARYL) 2 MG tablet [Pharmacy Med Name: GLIMEPIRIDE '2MG'$  TABLETS] 90 tablet 1    Sig: TAKE 1 TABLET(2 MG) BY MOUTH DAILY WITH BREAKFAST     Endocrinology:  Diabetes - Sulfonylureas Passed - 12/27/2021 11:57 AM      Passed - HBA1C is between 0 and 7.9 and within 180 days    Hemoglobin A1C  Date Value Ref Range Status  11/09/2021 6.8 (A) 4.0 - 5.6 % Final   Hgb A1c MFr Bld  Date Value Ref Range Status  07/04/2021 9.8 (H) 4.8 - 5.6 % Final    Comment:             Prediabetes: 5.7 - 6.4          Diabetes: >6.4          Glycemic control for adults with diabetes: <7.0          Passed - Cr in normal range and within 360 days    Creatinine, Ser  Date Value Ref Range Status  07/04/2021 1.06 0.76 - 1.27 mg/dL Final         Passed - Valid encounter within last 6 months    Recent Outpatient Visits           1 month ago DM (diabetes mellitus), type 2 with complications ([place])   [place] [facility] at [facility], Jesse Sans, MD   5 months ago DM (diabetes mellitus), type 2 with complications [facility])   [place] [facility] at [facility], Jesse Sans, MD   5 months ago Essential hypertension   [place] [facility] at [facility], Jesse Sans, MD   8 months ago DM (diabetes mellitus), type 2 with complications [facility])   [place] [facility] at [facility], Jesse Sans, MD   1 year ago DM (diabetes mellitus), type 2 with complications [facility])   [place] [facility] and [facility] Medicine at [facility], Jesse Sans, MD       Future Appointments             In 2 months Army Melia Jesse Sans, MD [place] [facility] at [facility], [facility]   In 8 months Hollice Espy, MD [facility] [place]             citalopram (Gap) 20 MG tablet  [Pharmacy Med Name: CITALOPRAM '20MG'$  TABLETS] 90 tablet 1    Sig: TAKE 1 TABLET(20 MG) BY MOUTH DAILY     Psychiatry:  Antidepressants - SSRI Passed - 12/27/2021 11:57 AM      Passed - Completed PHQ-2 or PHQ-9 in the last 360 days      Passed - Valid encounter within last 6 months    Recent Outpatient Visits           1 month ago DM (diabetes mellitus), type 2 with complications ([place])   [place] [facility] at [facility], Jesse Sans, MD   5 months ago DM (diabetes mellitus), type 2 with complications [facility])   [place] and Sports Medicine at [facility], Jesse Sans, MD   5 months ago Essential hypertension    Primary Care and Sports Medicine at [facility], Jesse Sans, MD   8 months ago DM (diabetes mellitus), type 2 with complications (  [facility])   [place] [facility] at [facility], Jesse Sans, MD   1 year ago DM (diabetes mellitus), type 2 with complications [facility])   [place] [facility] at [facility], Jesse Sans, MD       Future Appointments             In 2 months Army Melia, Jesse Sans, MD [facility] Primary Care and [facility] Medicine at [facility]   In 8 months Hollice Espy, [facility] [place]

## 2021-12-30 ENCOUNTER — Other Ambulatory Visit: Payer: Self-pay | Admitting: Internal Medicine

## 2021-12-30 DIAGNOSIS — I10 Essential (primary) hypertension: Secondary | ICD-10-CM

## 2021-12-31 NOTE — Telephone Encounter (Signed)
Requested Prescriptions  Pending Prescriptions Disp Refills   losartan-hydrochlorothiazide (HYZAAR) 100-25 MG tablet [Pharmacy Med Name: LOSARTAN/HCTZ 100/25MG TABLETS] 90 tablet 0    Sig: TAKE 1 TABLET BY MOUTH DAILY     Cardiovascular: ARB + Diuretic Combos Passed - 12/30/2021  8:00 AM      Passed - K in normal range and within 180 days    Potassium  Date Value Ref Range Status  07/04/2021 4.2 3.5 - 5.2 mmol/L Final         Passed - Na in normal range and within 180 days    Sodium  Date Value Ref Range Status  07/04/2021 140 134 - 144 mmol/L Final         Passed - Cr in normal range and within 180 days    Creatinine, Ser  Date Value Ref Range Status  07/04/2021 1.06 0.76 - 1.27 mg/dL Final         Passed - eGFR is 10 or above and within 180 days    GFR calc Af Amer  Date Value Ref Range Status  06/16/2019 72 >59 mL/min/1.73 Final    Comment:    **[facility] currently reports eGFR in compliance with the current**   recommendations of the [employer]. [facility] will   update reporting as new guidelines are published from the NKF-ASN   Task force.    GFR, Estimated  Date Value Ref Range Status  01/16/2021 >60 >60 mL/min Final    Comment:    (NOTE) Calculated using the CKD-EPI Creatinine Equation (2021)    eGFR  Date Value Ref Range Status  07/04/2021 77 >59 mL/min/1.73 Final         Passed - Patient is not pregnant      Passed - Last BP in normal range    BP Readings from Last 1 Encounters:  11/09/21 118/74         Passed - Valid encounter within last 6 months    Recent Outpatient Visits           1 month ago DM (diabetes mellitus), type 2 with complications ([place])   [place] [facility] at [facility] Jesse Sans, MD   5 months ago DM (diabetes mellitus), type 2 with complications [facility])   [place] [facility] and [facility] Medicine at [facility], Jesse Sans, MD   6 months ago Essential  hypertension   [place] [facility] Medicine at [facility], Jesse Sans, MD   8 months ago DM (diabetes mellitus), type 2 with complications [facility])   [place] [facility] and [facility] Medicine at [facility], Jesse Sans, MD   1 year ago DM (diabetes mellitus), type 2 with complications [facility])   [place] [facility] and Sports Medicine at [facility], Jesse Sans, MD       Future Appointments             In 2 months Army Melia, Jesse Sans, MD [facility] and [facility] at [facility], [facility]   In 8 months Hollice Espy, [facility] [place]

## 2022-01-21 ENCOUNTER — Telehealth: Payer: Self-pay | Admitting: Pharmacist

## 2022-01-21 ENCOUNTER — Other Ambulatory Visit: Payer: 59 | Admitting: Pharmacist

## 2022-01-21 NOTE — Progress Notes (Unsigned)
Attempted to contact patient for scheduled appointment for medication management. Left HIPAA compliant message for patient to return my call at their convenience.   Catie T. Jim Lundin, PharmD, BCACP [facility] [phone]  

## 2022-01-22 ENCOUNTER — Telehealth: Payer: Self-pay

## 2022-01-22 NOTE — Progress Notes (Signed)
Discussed with Anette Riedel, CPhT. She will reach out to patient to discuss next steps for patient assistance and will fax provider portion to Dr. Army Melia for completion.   Catie Hedwig Morton, PharmD, [facility] [phone]

## 2022-01-22 NOTE — Telephone Encounter (Signed)
Left message for pt on VM to call back in reference to PAP Applications     Please be advise  Selinda Orion CPhT Rx Patient Advocate

## 2022-01-22 NOTE — Telephone Encounter (Signed)
FAXED PCP page(s) to office. Please complete and sign and fax to ATTN: Anette Riedel [phone]   [facility] Rx Patient Advocate

## 2022-03-01 ENCOUNTER — Other Ambulatory Visit
Admission: RE | Admit: 2022-03-01 | Discharge: 2022-03-01 | Disposition: A | Discharge status: Home / Self Care | Payer: Medicare Other | Attending: Urology | Admitting: Urology

## 2022-03-01 DIAGNOSIS — C61 Malignant neoplasm of prostate: Secondary | ICD-10-CM | POA: Insufficient documentation

## 2022-03-01 LAB — PSA: Prostatic Specific Antigen: 0.07 ng/mL (ref 0.00–4.00)

## 2022-03-12 ENCOUNTER — Encounter: Payer: Self-pay | Admitting: Internal Medicine

## 2022-03-12 ENCOUNTER — Ambulatory Visit (INDEPENDENT_AMBULATORY_CARE_PROVIDER_SITE_OTHER): Payer: Medicare Other | Admitting: Internal Medicine

## 2022-03-12 VITALS — BP 104/62 | HR 70 | Ht 68.0 in | Wt 172.6 lb

## 2022-03-12 DIAGNOSIS — E1169 Type 2 diabetes mellitus with other specified complication: Secondary | ICD-10-CM | POA: Diagnosis not present

## 2022-03-12 DIAGNOSIS — E785 Hyperlipidemia, unspecified: Secondary | ICD-10-CM

## 2022-03-12 DIAGNOSIS — I10 Essential (primary) hypertension: Secondary | ICD-10-CM | POA: Diagnosis not present

## 2022-03-12 DIAGNOSIS — E118 Type 2 diabetes mellitus with unspecified complications: Secondary | ICD-10-CM | POA: Diagnosis not present

## 2022-03-12 DIAGNOSIS — F331 Major depressive disorder, recurrent, moderate: Secondary | ICD-10-CM

## 2022-03-12 LAB — POCT GLYCOSYLATED HEMOGLOBIN (HGB A1C): Hemoglobin A1C: 7.4 % — AB (ref 4.0–5.6)

## 2022-03-12 MED ORDER — GLIMEPIRIDE 2 MG PO TABS: ORAL_TABLET | ORAL | 1 refills | Status: AC

## 2022-03-12 MED ORDER — ATORVASTATIN CALCIUM 40 MG PO TABS: 40.0000 mg | ORAL_TABLET | Freq: Every day | ORAL | 1 refills | Status: AC

## 2022-03-12 MED ORDER — LOSARTAN POTASSIUM-HCTZ 100-25 MG PO TABS: 1.0000 | ORAL_TABLET | Freq: Every day | ORAL | 1 refills | Status: DC

## 2022-03-12 NOTE — Progress Notes (Signed)
Date:  03/12/2022   Name:  Calvin Sanders   DOB:  1955-09-23   MRN:  [id number]   Chief Complaint: Diabetes  Diabetes He presents for his follow-up diabetic visit. He has type 2 diabetes mellitus. His disease course has been stable. Pertinent negatives for hypoglycemia include no headaches or tremors. Pertinent negatives for diabetes include no chest pain, no fatigue, no polydipsia and no polyuria.  Hypertension This is a chronic problem. The problem is controlled. Pertinent negatives include no chest pain, headaches, palpitations or shortness of breath.    Lab Results  Component Value Date   NA 140 07/04/2021   K 4.2 07/04/2021   CO2 23 07/04/2021   GLUCOSE 200 (H) 07/04/2021   BUN 12 07/04/2021   CREATININE 1.06 07/04/2021   CALCIUM 10.2 07/04/2021   EGFR 77 07/04/2021   GFRNONAA >60 01/16/2021   Lab Results  Component Value Date   CHOL 166 07/04/2021   HDL 40 07/04/2021   LDLCALC 87 07/04/2021   TRIG 234 (H) 07/04/2021   CHOLHDL 4.2 07/04/2021   Lab Results  Component Value Date   TSH 1.560 06/21/2020   Lab Results  Component Value Date   HGBA1C 7.4 (A) 03/12/2022   Lab Results  Component Value Date   WBC 6.1 07/04/2021   HGB 15.8 07/04/2021   HCT 44.6 07/04/2021   MCV 94 07/04/2021   PLT 225 07/04/2021   Lab Results  Component Value Date   ALT 31 07/04/2021   AST 28 07/04/2021   ALKPHOS 94 07/04/2021   BILITOT 0.6 07/04/2021   Lab Results  Component Value Date   VD25OH 67.0 06/16/2019     Review of Systems  Constitutional:  Negative for appetite change, fatigue and unexpected weight change.  Eyes:  Negative for visual disturbance.  Respiratory:  Negative for cough, shortness of breath and wheezing.   Cardiovascular:  Negative for chest pain, palpitations and leg swelling.  Gastrointestinal:  Negative for abdominal pain and blood in stool.  Endocrine: Negative for polydipsia and polyuria.  Genitourinary:  Negative for dysuria and hematuria.   Skin:  Negative for color change and rash.  Neurological:  Negative for tremors, numbness and headaches.  Psychiatric/Behavioral:  Negative for dysphoric mood.     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     Past Surgical History:  Procedure Laterality Date   COLONOSCOPY WITH PROPOFOL N/A 08/02/2021   Procedure: COLONOSCOPY WITH PROPOFOL;  Surgeon: Lin Landsman, MD;  Location: [facility];  Service: Endoscopy;  Laterality: N/A;  Diabetic   PELVIC LYMPH NODE DISSECTION Bilateral 01/15/2021   Procedure: PELVIC LYMPH NODE DISSECTION;  Surgeon: Hollice Espy, MD;  Location: [facility];  Service: Urology;  Laterality: Bilateral;   POLYPECTOMY  08/02/2021   Procedure: POLYPECTOMY;  Surgeon: Lin Landsman, MD;  Location: [facility];  Service: Endoscopy;;   PROSTATE BIOPSY N/A 10/24/2020   Procedure: PROSTATE BIOPSY;  Surgeon: Abbie Sons, MD;  Location: [facility];  Service: Urology;  Laterality: N/A;   ROBOT ASSISTED LAPAROSCOPIC RADICAL PROSTATECTOMY N/A 01/15/2021   Procedure: XI ROBOTIC ASSISTED LAPAROSCOPIC RADICAL PROSTATECTOMY;  Surgeon: Hollice Espy, MD;  Location: [facility];  Service: Urology;  Laterality: N/A;   TRANSRECTAL ULTRASOUND N/A 10/24/2020   Procedure: TRANSRECTAL ULTRASOUND;  Surgeon: Abbie Sons, MD;  Location: [facility];  Service: Urology;  Laterality: N/A;  Confirmed with Kim in Ultrasound    Social History   Tobacco Use   Smoking status: Former    Packs/day: 0.25    Years: 45.00    Total pack years: 11.25    Types: Cigarettes    Start date: 02/11/1973    Quit date: 03/14/2021    Years since quitting: 0.9   Smokeless tobacco: Never  Vaping Use   Vaping Use: Never used  Substance Use Topics   Alcohol use: Yes    Alcohol/week: 14.0 standard drinks of alcohol    Types: 14 Cans of beer per week    Comment: back up to 1-2 beers per day   Drug use: Never     Medication list has been reviewed and updated.  Current Meds  Medication Sig   blood glucose meter kit and supplies KIT Dispense based on patient and insurance preference. Use up to four times daily as directed.   Cholecalciferol (VITAMIN D3) 2000 units TABS Take 2,000 Units by mouth daily.   citalopram (CELEXA) 20 MG tablet TAKE 1 TABLET(20 MG) BY MOUTH DAILY   Cyanocobalamin (B-12) 500 MCG TABS Take 1 tablet by mouth daily.   Insulin Pen Needle (PEN NEEDLES) 32G X 4 MM MISC 1 each by Does not apply route once a week.   metFORMIN (GLUCOPHAGE-XR) 500 MG 24 hr tablet Take 2 tablets (1,000 mg total) by mouth 2 (two) times daily with a meal.   oxybutynin (DITROPAN) 5 MG tablet Take 1 tablet (5 mg total) by mouth every 8 (eight) hours as needed for bladder spasms.   sildenafil (REVATIO) 20 MG tablet 2-5 tablets as needed one hour prior to intercourse.   TRULICITY 1.5 HU/3.1SH SOPN Inject 1.5 mg into the skin once a week.   [DISCONTINUED] atorvastatin (LIPITOR) 40 MG tablet TAKE 1 TABLET BY MOUTH EVERY DAY   [DISCONTINUED] losartan-hydrochlorothiazide (HYZAAR) 100-25 MG tablet TAKE 1 TABLET BY MOUTH DAILY       03/12/2022   10:05 AM 11/09/2021    8:26  AM 07/04/2021    9:27 AM 04/23/2021    9:43 AM  GAD 7 : Generalized Anxiety Score  Nervous, Anxious, on Edge 0 0 0 0  Control/stop worrying 0 0 0 0  Worry too much - different things 0 0 0 0  Trouble relaxing 0 0 0 0  Restless 0 0 0 0  Easily annoyed or irritable 0 1 0 0  Afraid - awful might happen 0 0 0 0  Total GAD 7 Score 0 1 0 0  Anxiety Difficulty Not difficult at all Not difficult at all Not difficult at all Not difficult at all       03/12/2022   10:05 AM 11/21/2021    9:48 AM 11/09/2021    8:26 AM  Depression  screen PHQ 2/9  Decreased Interest 0 0 0  Down, Depressed, Hopeless 0 0 0  PHQ - 2 Score 0 0 0  Altered sleeping 0 0 0  Tired, decreased energy 0 0 0  Change in appetite 0 0 0  Feeling bad or failure about yourself  0 0 0  Trouble concentrating 0 0 0  Moving slowly or fidgety/restless 0 0 0  Suicidal thoughts 0 0 0  PHQ-9 Score 0 0 0  Difficult doing work/chores Not difficult at all Not difficult at all Not difficult at all    BP Readings from Last 3 Encounters:  03/12/22 104/62  11/09/21 118/74  08/31/21 115/66    Physical Exam Vitals and nursing note reviewed.  Constitutional:      General: He is not in acute distress.    Appearance: He is well-developed.  HENT:     Head: Normocephalic and atraumatic.  Pulmonary:     Effort: Pulmonary effort is normal. No respiratory distress.  Skin:    General: Skin is warm and dry.     Findings: No rash.  Neurological:     Mental Status: He is alert and oriented to person, place, and time.  Psychiatric:        Mood and Affect: Mood normal.        Behavior: Behavior normal.     Wt Readings from Last 3 Encounters:  03/12/22 172 lb 9.6 oz (78.3 kg)  11/21/21 178 lb (80.7 kg)  11/09/21 178 lb (80.7 kg)    BP 104/62   Pulse 70   Ht '5\' 8"'$  (1.727 m)   Wt 172 lb 9.6 oz (78.3 kg)   SpO2 96%   BMI 26.24 kg/m   Assessment and Plan: Problem List Items Addressed This Visit       Cardiovascular and  Mediastinum   Essential hypertension (Chronic)    Clinically stable exam with well controlled BP on losartan hct. Tolerating medications without side effects. Pt to continue current regimen and low sodium diet.       Relevant Medications   atorvastatin (LIPITOR) 40 MG tablet   losartan-hydrochlorothiazide (HYZAAR) 100-25 MG tablet     Endocrine   DM (diabetes mellitus), type 2 with complications (HCC) - Primary (Chronic)    Clinically stable without s/s of hypoglycemia. Tolerating metformin and Trulicity well without side effects or other concerns, however Trulicity is $161/WR and he has been out of glimepiride for 2 mo (sent to wrong pharmacy and pt did not for clarification) Wife will call insurance for a more affordable alternative and let me know. Last A1C 6.8 A1C today 7.4 which is still acceptable. Resume glimepiride.       Relevant Medications   TRULICITY 1.5 UE/4.5WU SOPN   glimepiride (AMARYL) 2 MG tablet   atorvastatin (LIPITOR) 40 MG tablet   losartan-hydrochlorothiazide (HYZAAR) 100-25 MG tablet   Other Relevant Orders   POCT glycosylated hemoglobin (Hb A1C) (Completed)   Hyperlipidemia associated with type 2 diabetes mellitus (HCC) (Chronic)    On atorvastatin 40 mg without side effects Lipids will be checked next visit      Relevant Medications   TRULICITY 1.5 JW/1.1BJ SOPN   glimepiride (AMARYL) 2 MG tablet   atorvastatin (LIPITOR) 40 MG tablet   losartan-hydrochlorothiazide (HYZAAR) 100-25 MG tablet     Other   Major depressive disorder, recurrent episode, moderate (HCC) (Chronic)    Clinically stable on current regimen with good control of symptoms, No SI or HI. No change in  management at this time.         Partially dictated using Editor, commissioning. Any errors are unintentional.  Halina Maidens, MD [facility]  03/12/2022

## 2022-03-12 NOTE — Patient Instructions (Signed)
Ozempic  Mounjaro  Bydureon  Rybelsus (pill)

## 2022-03-12 NOTE — Assessment & Plan Note (Signed)
On atorvastatin 40 mg without side effects Lipids will be checked next visit

## 2022-03-12 NOTE — Assessment & Plan Note (Signed)
Clinically stable on current regimen with good control of symptoms, No SI or HI. No change in management at this time.  

## 2022-03-12 NOTE — Assessment & Plan Note (Signed)
Clinically stable exam with well controlled BP on losartan hct. Tolerating medications without side effects. Pt to continue current regimen and low sodium diet.  

## 2022-03-12 NOTE — Assessment & Plan Note (Addendum)
Clinically stable without s/s of hypoglycemia. Tolerating metformin and Trulicity well without side effects or other concerns, however Trulicity is $872/JL and he has been out of glimepiride for 2 mo (sent to wrong pharmacy and pt did not for clarification) Wife will call insurance for a more affordable alternative and let me know. Last A1C 6.8 A1C today 7.4 which is still acceptable. Resume glimepiride.

## 2022-03-17 ENCOUNTER — Other Ambulatory Visit: Payer: Self-pay | Admitting: Internal Medicine

## 2022-03-17 DIAGNOSIS — E118 Type 2 diabetes mellitus with unspecified complications: Secondary | ICD-10-CM

## 2022-03-26 ENCOUNTER — Other Ambulatory Visit: Payer: Self-pay | Admitting: Internal Medicine

## 2022-03-27 ENCOUNTER — Other Ambulatory Visit: Payer: Self-pay | Admitting: Internal Medicine

## 2022-03-27 DIAGNOSIS — F331 Major depressive disorder, recurrent, moderate: Secondary | ICD-10-CM

## 2022-03-27 NOTE — Telephone Encounter (Signed)
Unable to refill per protocol, Rx request is too soon, last refill 12/27/21 for 90 and 1 refill.  Requested Prescriptions  Pending Prescriptions Disp Refills   citalopram (CELEXA) 20 MG tablet [Pharmacy Med Name: CITALOPRAM 20MG TABLETS] 90 tablet 1    Sig: TAKE 1 TABLET(20 MG) BY MOUTH DAILY     Psychiatry:  Antidepressants - SSRI Passed - 03/27/2022  8:00 AM      Passed - Completed PHQ-2 or PHQ-9 in the last 360 days      Passed - Valid encounter within last 6 months    Recent Outpatient Visits           2 weeks ago DM (diabetes mellitus), type 2 with complications [facility])   [place] [facility] at [facility], Jesse Sans, MD   4 months ago DM (diabetes mellitus), type 2 with complications [facility])   [place] at [facility], Jesse Sans, MD   8 months ago DM (diabetes mellitus), type 2 with complications [facility])   [place] at [facility], Jesse Sans, MD   8 months ago Essential hypertension   [place] at [facility], Jesse Sans, MD   11 months ago DM (diabetes mellitus), type 2 with complications [facility])   [place] at [facility], Jesse Sans, MD       Future Appointments             In 3 months Army Melia, Jesse Sans, MD [place] at [facility], [facility]   In 5 months Hollice Espy, [facility] [place]

## 2022-03-27 NOTE — Telephone Encounter (Signed)
Requested medication (s) are due for refill today - unsure  Requested medication (s) are on the active medication list -yes  Future visit scheduled -yes  Last refill: 03/12/22  Notes to clinic: listed as historical provider- sent for review   Requested Prescriptions  Pending Prescriptions Disp Refills   TRULICITY 1.5 0000000 SOPN [Pharmacy Med Name: TRULICITY [id number] SDP 0.5ML] 2 mL     Sig: ADMINISTER 1.5 MG UNDER THE SKIN 1 TIME A WEEK     Endocrinology:  Diabetes - GLP-1 Receptor Agonists Passed - 03/26/2022  7:28 PM      Passed - HBA1C is between 0 and 7.9 and within 180 days    Hemoglobin A1C  Date Value Ref Range Status  03/12/2022 7.4 (A) 4.0 - 5.6 % Final   Hgb A1c MFr Bld  Date Value Ref Range Status  07/04/2021 9.8 (H) 4.8 - 5.6 % Final    Comment:             Prediabetes: 5.7 - 6.4          Diabetes: >6.4          Glycemic control for adults with diabetes: <7.0          Passed - Valid encounter within last 6 months    Recent Outpatient Visits           2 weeks ago DM (diabetes mellitus), type 2 with complications ([place])   [place] [facility] at [facility], Jesse Sans, MD   4 months ago DM (diabetes mellitus), type 2 with complications [facility])   [place] at [facility], Jesse Sans, MD   8 months ago DM (diabetes mellitus), type 2 with complications [facility])   [place] at [facility], Jesse Sans, MD   8 months ago Essential hypertension   [place] at [facility], Jesse Sans, MD   11 months ago DM (diabetes mellitus), type 2 with complications [facility])   [place] at [facility], Jesse Sans, MD       Future Appointments             In 3 months Glean Hess, MD [place] at [facility]   In 5 months  Hollice Espy, MD [facility] [place]               Requested Prescriptions  Pending Prescriptions Disp Refills   TRULICITY 1.5 0000000 SOPN [Pharmacy Med Name: TRULICITY [id number] SDP 0.5ML] 2 mL     Sig: ADMINISTER 1.5 MG UNDER THE SKIN 1 TIME A WEEK     Endocrinology:  Diabetes - GLP-1 Receptor Agonists Passed - 03/26/2022  7:28 PM      Passed - HBA1C is between 0 and 7.9 and within 180 days    Hemoglobin A1C  Date Value Ref Range Status  03/12/2022 7.4 (A) 4.0 - 5.6 % Final   Hgb A1c MFr Bld  Date Value Ref Range Status  07/04/2021 9.8 (H) 4.8 - 5.6 % Final    Comment:             Prediabetes: 5.7 - 6.4          Diabetes: >6.4          Glycemic control for adults with diabetes: <7.0  Passed - Valid encounter within last 6 months    Recent Outpatient Visits           2 weeks ago DM (diabetes mellitus), type 2 with complications [facility])   [place] [facility] at [facility], Jesse Sans, MD   4 months ago DM (diabetes mellitus), type 2 with complications [facility])   [place] at [facility], Jesse Sans, MD   8 months ago DM (diabetes mellitus), type 2 with complications [facility])   [place] at [facility], Jesse Sans, MD   8 months ago Essential hypertension   [place] at [facility], Jesse Sans, MD   11 months ago DM (diabetes mellitus), type 2 with complications [facility])   [place] at [facility], Jesse Sans, MD       Future Appointments             In 3 months Army Melia, Jesse Sans, MD [place] at [facility], [facility]   In 5 months Hollice Espy, [facility] [place]

## 2022-04-04 ENCOUNTER — Other Ambulatory Visit: Payer: Self-pay | Admitting: Internal Medicine

## 2022-04-04 DIAGNOSIS — E118 Type 2 diabetes mellitus with unspecified complications: Secondary | ICD-10-CM

## 2022-04-04 NOTE — Telephone Encounter (Signed)
Unable to refill per protocol, Rx expired. Discontinued 12/13/21, due to side effects.  Requested Prescriptions  Pending Prescriptions Disp Refills   metFORMIN (GLUCOPHAGE) 500 MG tablet [Pharmacy Med Name: METFORMIN 500MG TABLETS] 360 tablet 1    Sig: TAKE 2 TABLETS(1000 MG) BY MOUTH TWICE DAILY WITH A MEAL     Endocrinology:  Diabetes - Biguanides Passed - 04/04/2022  3:38 AM      Passed - Cr in normal range and within 360 days    Creatinine, Ser  Date Value Ref Range Status  07/04/2021 1.06 0.76 - 1.27 mg/dL Final         Passed - HBA1C is between 0 and 7.9 and within 180 days    Hemoglobin A1C  Date Value Ref Range Status  03/12/2022 7.4 (A) 4.0 - 5.6 % Final   Hgb A1c MFr Bld  Date Value Ref Range Status  07/04/2021 9.8 (H) 4.8 - 5.6 % Final    Comment:             Prediabetes: 5.7 - 6.4          Diabetes: >6.4          Glycemic control for adults with diabetes: <7.0          Passed - eGFR in normal range and within 360 days    GFR calc Af Amer  Date Value Ref Range Status  06/16/2019 72 >59 mL/min/1.73 Final    Comment:    **[facility] currently reports eGFR in compliance with the current**   recommendations of the [employer]. [facility] will   update reporting as new guidelines are published from the NKF-ASN   Task force.    GFR, Estimated  Date Value Ref Range Status  01/16/2021 >60 >60 mL/min Final    Comment:    (NOTE) Calculated using the CKD-EPI Creatinine Equation (2021)    eGFR  Date Value Ref Range Status  07/04/2021 77 >59 mL/min/1.73 Final         Passed - B12 Level in normal range and within 720 days    Vitamin B-12  Date Value Ref Range Status  07/04/2021 933 232 - 1,245 pg/mL Final         Passed - Valid encounter within last 6 months    Recent Outpatient Visits           3 weeks ago DM (diabetes mellitus), type 2 with complications ([place])   [place] [facility] at [facility], Jesse Sans, MD    4 months ago DM (diabetes mellitus), type 2 with complications [facility])   [place] at [facility], Jesse Sans, MD   8 months ago DM (diabetes mellitus), type 2 with complications [facility])   [place] at [facility], Jesse Sans, MD   9 months ago Essential hypertension   [place] at [facility], Jesse Sans, MD   11 months ago DM (diabetes mellitus), type 2 with complications [facility])   [place] at [facility], Jesse Sans, MD       Future Appointments             In 3 months Army Melia, Jesse Sans, MD [place] at [facility], [facility]   In 5 months Hollice Espy, [facility] [place]  Passed - CBC within normal limits and completed in the last 12 months    WBC  Date Value Ref Range Status  07/04/2021 6.1 3.4 - 10.8 x10E3/uL Final  01/16/2021 9.4 4.0 - 10.5 K/uL Final   RBC  Date Value Ref Range Status  07/04/2021 4.74 4.14 - 5.80 x10E6/uL Final  01/16/2021 3.97 (L) 4.22 - 5.81 MIL/uL Final   Hemoglobin  Date Value Ref Range Status  07/04/2021 15.8 13.0 - 17.7 g/dL Final   Hematocrit  Date Value Ref Range Status  07/04/2021 44.6 37.5 - 51.0 % Final   MCHC  Date Value Ref Range Status  07/04/2021 35.4 31.5 - 35.7 g/dL Final  01/16/2021 35.1 30.0 - 36.0 g/dL Final   [facility]  Date Value Ref Range Status  07/04/2021 33.3 (H) 26.6 - 33.0 pg Final  01/16/2021 34.3 (H) 26.0 - 34.0 pg Final   MCV  Date Value Ref Range Status  07/04/2021 94 79 - 97 fL Final   No results found for: "PLTCOUNTKUC", "LABPLAT", "POCPLA" RDW  Date Value Ref Range Status  07/04/2021 12.7 11.6 - 15.4 % Final

## 2022-04-17 ENCOUNTER — Telehealth: Payer: Self-pay | Admitting: Internal Medicine

## 2022-04-17 NOTE — Telephone Encounter (Signed)
Pt's wife is calling because the pt is on Trulicity and they are having a hard time finding it. Pt would like to know could he be prescribed something else. Pt reached out to their insurance company and was told Bydureon and Darcel Bayley are covered, but the Bydureon would be cheaper. Please advise.

## 2022-04-18 ENCOUNTER — Other Ambulatory Visit: Payer: Self-pay | Admitting: Internal Medicine

## 2022-04-18 DIAGNOSIS — E118 Type 2 diabetes mellitus with unspecified complications: Secondary | ICD-10-CM

## 2022-04-18 MED ORDER — BYDUREON BCISE 2 MG/0.85ML ~~LOC~~ AUIJ: 2.0000 mg | AUTO-INJECTOR | SUBCUTANEOUS | 3 refills | Status: DC

## 2022-04-18 NOTE — Telephone Encounter (Signed)
Patient wife informed.  Calvin Sanders

## 2022-07-17 ENCOUNTER — Encounter: Payer: Medicare Other | Admitting: Internal Medicine

## 2022-07-26 ENCOUNTER — Other Ambulatory Visit: Payer: Self-pay | Admitting: Internal Medicine

## 2022-07-26 DIAGNOSIS — F331 Major depressive disorder, recurrent, moderate: Secondary | ICD-10-CM

## 2022-07-26 NOTE — Telephone Encounter (Signed)
Patient request refill until OV with new provider.

## 2022-07-26 NOTE — Telephone Encounter (Signed)
Medication Refill - Medication: Citalopram   [place] appt in July with a new doctor but does not have enough med to get to that appt  Has the patient contacted their pharmacy? No. (Agent: If no, request that the patient contact the pharmacy for the refill. If patient does not wish to contact the pharmacy document the reason why and proceed with request.) (Agent: If yes, when and what did the pharmacy advise?)  Preferred Pharmacy (with phone number or street name): [facility] [address].  [place]  [phone] Has the patient been seen for an appointment in the last year OR does the patient have an upcoming appointment? Yes.    Agent: Please be advised that RX refills may take up to 3 business days. We ask that you follow-up with your pharmacy.

## 2022-07-26 NOTE — Telephone Encounter (Signed)
Requested medications are due for refill today.  yes  Requested medications are on the active medications list.  yes  Last refill. 12/27/2021 #90 1 rf  Future visit scheduled.   no  Notes to clinic.  Medication Refill - Medication: Citalopram   [place] an appt in July with a new doctor but does not have enough med to get to that appt   Has the patient contacted their pharmacy? No. (Agent: If no, request that the patient contact the pharmacy for the refill. If patient does not wish to contact the pharmacy document the reason why and proceed with request.) (Agent: If yes, when and what did the pharmacy advise?)   Preferred Pharmacy (with phone number or street name): [facility] [address].  [place]  [phone] Has the patient been seen for an appointment in the last year OR does the patient have an upcoming appointment? Yes.     Agent: Please be advised that RX refills may take up to 3 business days. We ask that you follow-up with your pharmacy.     Requested Prescriptions  Pending Prescriptions Disp Refills   citalopram (CELEXA) 20 MG tablet 90 tablet 1     Psychiatry:  Antidepressants - SSRI Passed - 07/26/2022  4:19 PM      Passed - Completed PHQ-2 or PHQ-9 in the last 360 days      Passed - Valid encounter within last 6 months    Recent Outpatient Visits           4 months ago DM (diabetes mellitus), type 2 with complications (H[facility])   [place] [facility] at [facility], Nyoka Cowden, MD   8 months ago DM (diabetes mellitus), type 2 with complications [facility])   [place] [facility] at [facility], Nyoka Cowden, MD   1 year ago DM (diabetes mellitus), type 2 with complications [facility])   [place] [facility] at [facility], Nyoka Cowden, MD   1 year ago Essential hypertension   [place] [facility] at [facility], Nyoka Cowden, MD    1 year ago DM (diabetes mellitus), type 2 with complications [facility])   [place] [facility] at [facility], Nyoka Cowden, MD       Future Appointments             In 1 month Vanna Scotland, MD [facility] [place]

## 2022-07-29 MED ORDER — CITALOPRAM HYDROBROMIDE 20 MG PO TABS: ORAL_TABLET | ORAL | 1 refills | Status: AC

## 2022-08-27 ENCOUNTER — Other Ambulatory Visit: Payer: Self-pay | Admitting: Internal Medicine

## 2022-08-27 DIAGNOSIS — E118 Type 2 diabetes mellitus with unspecified complications: Secondary | ICD-10-CM

## 2022-09-02 ENCOUNTER — Telehealth: Payer: Self-pay | Admitting: Urology

## 2022-09-02 DIAGNOSIS — C61 Malignant neoplasm of prostate: Secondary | ICD-10-CM

## 2022-09-02 NOTE — Telephone Encounter (Signed)
Patient advised and he did move to [place]. Referral placed

## 2022-09-02 NOTE — Telephone Encounter (Signed)
Ok to refer.

## 2022-09-02 NOTE — Telephone Encounter (Signed)
Pt.  Cancelled appt. For 09/06/2022@9 :30 He wants to see a Urologist in [facility] but he needs a referral to a Dr. Grandville Silos [phone]. They gave this # for you to call Thanks

## 2022-09-04 ENCOUNTER — Other Ambulatory Visit (HOSPITAL_COMMUNITY): Payer: Self-pay | Admitting: Family Medicine

## 2022-09-04 DIAGNOSIS — R413 Other amnesia: Secondary | ICD-10-CM

## 2022-09-04 DIAGNOSIS — R011 Cardiac murmur, unspecified: Secondary | ICD-10-CM

## 2022-09-06 ENCOUNTER — Ambulatory Visit: Payer: Medicare Other | Admitting: Urology

## 2022-09-12 ENCOUNTER — Ambulatory Visit: Payer: Medicare Other | Admitting: Urology

## 2022-09-12 ENCOUNTER — Other Ambulatory Visit: Payer: Self-pay

## 2022-09-12 ENCOUNTER — Encounter (INDEPENDENT_AMBULATORY_CARE_PROVIDER_SITE_OTHER): Payer: Self-pay | Admitting: Urology

## 2022-09-12 VITALS — Ht 68.0 in | Wt 183.0 lb

## 2022-09-12 DIAGNOSIS — Z87891 Personal history of nicotine dependence: Secondary | ICD-10-CM | POA: Insufficient documentation

## 2022-09-12 DIAGNOSIS — Z8546 Personal history of malignant neoplasm of prostate: Secondary | ICD-10-CM | POA: Insufficient documentation

## 2022-09-12 DIAGNOSIS — R972 Elevated prostate specific antigen [PSA]: Secondary | ICD-10-CM | POA: Insufficient documentation

## 2022-09-12 DIAGNOSIS — Z9079 Acquired absence of other genital organ(s): Secondary | ICD-10-CM | POA: Insufficient documentation

## 2022-09-12 NOTE — Procedures (Signed)
[facility]  [address]  [place] [facility] [address]    Procedure Note    Name: Robert Miles MRN:  [id number]   Date: 09/12/2022 DOB:  December 31, 1955 (66 y.o.)         81001 - URINALYSIS, AUTOMATED W/ MICROSCOPY (AMB ONLY)    Performed by: Girtha Hake, MD  Authorized by: Girtha Hake, MD    Documentation:       Urine is clear under the microscope  No red cells or white cells seen  Creatinine 300   Glucose 1000        Girtha Hake, MD

## 2022-09-12 NOTE — Progress Notes (Signed)
[facility]  [address]  [address]  [place] [address]  [phone]   Urology Progress Note    Date of Service:  09/12/2022  Robert Miles, 67 y.o. male  Date of Birth:  10/02/1955  PCP: Tyson Babinski, MD  Referring:  No ref. provider found     HPI:  Robert Miles is a 67 y.o. White male who had a robotic radical prostatectomy 2 years ago and his urologist in [place] told him the PSA was gradually going up.  I think the high as 2 got was in March when it was 0.7.  They had even discuss doing radiation therapy to the prostate bed.    Strangely enough PSA was repeated here at our labs and [place] and the total PSA is 0.1 which is obviously nondetectable and alkaline phosphatase is normal at 63.  It seems to me that this gentleman does not have any active prostate cancer this time.  However since there has been a discrepancy in the PSA levels were going to go ahead and repeat the PSA with a completely different lab at the [facility] lab in 3 months' time.  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    No past medical history on file.   No past surgical history on file.   Social History     Tobacco Use    Smoking status: Former     Types: Cigarettes   Substance Use Topics    Alcohol use: Not Currently    Drug use: Not Currently       Family Medical History:    None        Outpatient Medications Marked as Taking for the 09/12/22 encounter (Office Visit) with Girtha Hake, MD   Medication Sig    atorvastatin (LIPITOR) 40 mg Oral Tablet Take 1 Tablet (40 mg total) by mouth Once a day    BYDUREON BCISE 2 mg/0.85 mL Subcutaneous Auto-Injector 0.85 mL (2 mg total) by abdominal subcutaneous route Every 7 days    citalopram (CELEXA) 20 mg Oral Tablet Take 1 Tablet (20 mg total) by mouth Once a day    glimepiride (AMARYL) 2 mg Oral Tablet Take 1 Tablet (2 mg total) by mouth  Every morning with breakfast    losartan-hydrochlorothiazide (HYZAAR) 100-25 mg Oral Tablet     metFORMIN (GLUCOPHAGE XR) 500 mg Oral Tablet Sustained Release 24 hr Take 1 Tablet (500 mg total) by mouth Once a day    vitamin B complex Oral Tablet Take 1 Tablet by mouth Once a day    vitamin E (AQUASOL E) 22.5 mg (50 unit)/mL Oral Drops Take 0.6 mL (30 Units total) by mouth Once a day      No Known Allergies     Review of Systems   Constitutional:  Negative for chills, fever and malaise/fatigue.   Eyes:  Negative for blurred vision.   Respiratory:  Negative for shortness of breath and wheezing.    Cardiovascular:  Negative for chest pain.   Gastrointestinal:  Negative for abdominal pain, nausea and vomiting.   Genitourinary:  Negative for dysuria, flank pain, frequency, hematuria and urgency.   Musculoskeletal:  Negative for back pain.   Skin: Negative.    Neurological:  Negative for weakness.   Psychiatric/Behavioral:  Negative for memory loss.           Ht 1.727 m (5\' 8" )  Wt 83 kg (183 lb)   BMI 27.83 kg/m          Physical Exam  Vitals and nursing note reviewed.   Eyes:      Conjunctiva/sclera: Conjunctivae normal.   Pulmonary:      Effort: Pulmonary effort is normal. No respiratory distress.      Breath sounds: Normal breath sounds. No wheezing or rales.   Chest:      Chest wall: No tenderness.   Abdominal:      General: Bowel sounds are normal. There is no distension.      Palpations: Abdomen is soft. There is no mass.      Tenderness: There is no abdominal tenderness. There is no guarding or rebound.      Comments: Negative for flank pain.     Musculoskeletal:      Cervical back: Normal range of motion.      Comments: Negative for low back pain.     Skin:     General: Skin is warm and dry.   Neurological:      Mental Status: He is alert and oriented to person, place, and time.   Psychiatric:         Mood and Affect: Mood and affect normal.          Data Review:  Pertinent laboratory data and imaging  studies reviewed.          [facility]  [address]  [place] [facility] [address]    Procedure Note    Name: Robert Miles MRN:  [id number]   Date: 09/12/2022 DOB:  04-23-1955 (66 y.o.)         81001 - URINALYSIS, AUTOMATED W/ MICROSCOPY (AMB ONLY)    Performed by: Girtha Hake, MD  Authorized by: Girtha Hake, MD    Documentation:       Urine is clear under the microscope  No red cells or white cells seen  Creatinine 300   Glucose 1000        Girtha Hake, MD    Assessment  Assessment/Plan   1. Personal history of prostate cancer    2. Elevated PSA       Status post robotic radical prostatectomy with a history of PSAs that were going up to 0.7   Most recent PSA here in [place] 0.1 with a alkaline phosphatase 63    Plan:  Repeat PSA at the [facility] lab in 3 months    Return in about 3 months (around 12/13/2022).     Patient was seen and evaluated and findings were communicated with referring physician.    Girtha Hake, MD

## 2022-09-19 ENCOUNTER — Inpatient Hospital Stay
Admission: RE | Admit: 2022-09-19 | Discharge: 2022-09-19 | Disposition: A | Discharge status: Home / Self Care | Payer: Medicare Other | Source: Ambulatory Visit | Attending: Family Medicine | Admitting: Family Medicine

## 2022-09-19 ENCOUNTER — Other Ambulatory Visit: Payer: Self-pay

## 2022-09-19 ENCOUNTER — Inpatient Hospital Stay (HOSPITAL_COMMUNITY)
Admission: RE | Admit: 2022-09-19 | Discharge: 2022-09-19 | Disposition: A | Discharge status: Home / Self Care | Payer: Medicare Other | Source: Ambulatory Visit | Attending: Family Medicine | Admitting: Family Medicine

## 2022-09-19 DIAGNOSIS — R413 Other amnesia: Secondary | ICD-10-CM

## 2022-09-19 DIAGNOSIS — R011 Cardiac murmur, unspecified: Secondary | ICD-10-CM | POA: Insufficient documentation

## 2022-09-29 ENCOUNTER — Other Ambulatory Visit: Payer: Self-pay | Admitting: Internal Medicine

## 2022-09-29 DIAGNOSIS — I10 Essential (primary) hypertension: Secondary | ICD-10-CM

## 2022-10-01 NOTE — Telephone Encounter (Signed)
Attempted to call patient to schedule f/u appointment- left message to call office. Courtesy RF given #[phone] Requested Prescriptions  Pending Prescriptions Disp Refills   losartan-hydrochlorothiazide (HYZAAR) 100-25 MG tablet [Pharmacy Med Name: LOSARTAN/HCTZ 100/25MG  TABLETS] 30 tablet 0    Sig: TAKE 1 TABLET BY MOUTH DAILY     Cardiovascular: ARB + Diuretic Combos Failed - 09/29/2022  3:38 AM      Failed - K in normal range and within 180 days    Potassium  Date Value Ref Range Status  07/04/2021 4.2 3.5 - 5.2 mmol/L Final         Failed - Na in normal range and within 180 days    Sodium  Date Value Ref Range Status  07/04/2021 140 134 - 144 mmol/L Final         Failed - Cr in normal range and within 180 days    Creatinine, Ser  Date Value Ref Range Status  07/04/2021 1.06 0.76 - 1.27 mg/dL Final         Failed - eGFR is 10 or above and within 180 days    GFR calc Af Amer  Date Value Ref Range Status  06/16/2019 72 >59 mL/min/1.73 Final    Comment:    **[facility] currently reports eGFR in compliance with the current**   recommendations of the [employer]. [facility] will   update reporting as new guidelines are published from the NKF-ASN   Task force.    GFR, Estimated  Date Value Ref Range Status  01/16/2021 >60 >60 mL/min Final    Comment:    (NOTE) Calculated using the CKD-EPI Creatinine Equation (2021)    eGFR  Date Value Ref Range Status  07/04/2021 77 >59 mL/min/1.73 Final         Failed - Valid encounter within last 6 months    Recent Outpatient Visits           6 months ago DM (diabetes mellitus), type 2 with complications [facility])   [place] [facility] at [facility], Nyoka Cowden, MD   10 months ago DM (diabetes mellitus), type 2 with complications [facility])   [place] [facility] at [facility], Nyoka Cowden, MD   1 year ago DM (diabetes mellitus), type 2 with complications [facility])    [place] [facility] at [facility], Nyoka Cowden, MD   1 year ago Essential hypertension   [place] [facility] at [facility], Nyoka Cowden, MD   1 year ago DM (diabetes mellitus), type 2 with complications [facility])   [place] [facility] at [facility], Nyoka Cowden, MD              Passed - Patient is not pregnant      Passed - Last BP in normal range    BP Readings from Last 1 Encounters:  03/12/22 104/62

## 2022-11-01 ENCOUNTER — Encounter (INDEPENDENT_AMBULATORY_CARE_PROVIDER_SITE_OTHER): Payer: Self-pay

## 2022-12-12 ENCOUNTER — Ambulatory Visit (INDEPENDENT_AMBULATORY_CARE_PROVIDER_SITE_OTHER): Payer: Self-pay | Admitting: Urology

## 2023-03-07 ENCOUNTER — Telehealth: Payer: Self-pay | Admitting: Internal Medicine

## 2023-03-07 NOTE — Telephone Encounter (Signed)
Copied from CRM [id number]. Topic: Medicare AWV >> Mar 07, 2023 10:50 AM Payton Doughty wrote: Reason for CRM: Called LVM 03/07/2023 to schedule AWV. Please schedule office or virtual visits.  Verlee Rossetti; Care Guide Ambulatory Clinical Support [facility] Direct Dial: [phone]

## 2023-07-01 ENCOUNTER — Encounter (INDEPENDENT_AMBULATORY_CARE_PROVIDER_SITE_OTHER): Payer: Self-pay
